# Patient Record
Sex: Male | Born: 1955 | Race: Black or African American | Hispanic: No | Marital: Married | Smoking: Never smoker
Health system: Southern US, Community
[De-identification: ages and names within clinical notes are randomized; demographics above are authoritative.]

## PROBLEM LIST (undated history)

## (undated) DIAGNOSIS — I509 Heart failure, unspecified: Secondary | ICD-10-CM

## (undated) DIAGNOSIS — J449 Chronic obstructive pulmonary disease, unspecified: Secondary | ICD-10-CM

## (undated) DIAGNOSIS — J45909 Unspecified asthma, uncomplicated: Secondary | ICD-10-CM

## (undated) DIAGNOSIS — I10 Essential (primary) hypertension: Secondary | ICD-10-CM

## (undated) DIAGNOSIS — N189 Chronic kidney disease, unspecified: Secondary | ICD-10-CM

## (undated) DIAGNOSIS — E785 Hyperlipidemia, unspecified: Secondary | ICD-10-CM

## (undated) DIAGNOSIS — G4730 Sleep apnea, unspecified: Secondary | ICD-10-CM

## (undated) DIAGNOSIS — I219 Acute myocardial infarction, unspecified: Secondary | ICD-10-CM

## (undated) HISTORY — PX: NO PAST SURGERIES: SHX2092

---

## 1997-12-13 ENCOUNTER — Emergency Department (HOSPITAL_COMMUNITY): Admission: EM | Admit: 1997-12-13 | Discharge: 1997-12-13 | Payer: Self-pay | Admitting: Emergency Medicine

## 2000-08-07 ENCOUNTER — Ambulatory Visit (HOSPITAL_BASED_OUTPATIENT_CLINIC_OR_DEPARTMENT_OTHER): Admission: RE | Admit: 2000-08-07 | Discharge: 2000-08-07 | Payer: Self-pay | Admitting: Internal Medicine

## 2001-02-20 ENCOUNTER — Ambulatory Visit (HOSPITAL_COMMUNITY): Admission: RE | Admit: 2001-02-20 | Discharge: 2001-02-20 | Payer: Self-pay | Admitting: Gastroenterology

## 2009-10-05 ENCOUNTER — Encounter (INDEPENDENT_AMBULATORY_CARE_PROVIDER_SITE_OTHER): Payer: Self-pay | Admitting: Internal Medicine

## 2009-10-05 ENCOUNTER — Inpatient Hospital Stay (HOSPITAL_COMMUNITY): Admission: EM | Admit: 2009-10-05 | Discharge: 2009-10-09 | Payer: Self-pay | Admitting: Emergency Medicine

## 2009-10-05 ENCOUNTER — Ambulatory Visit: Payer: Self-pay | Admitting: Pulmonary Disease

## 2009-11-20 ENCOUNTER — Encounter: Payer: Self-pay | Admitting: Internal Medicine

## 2010-02-13 ENCOUNTER — Observation Stay (HOSPITAL_COMMUNITY): Admission: EM | Admit: 2010-02-13 | Discharge: 2010-02-14 | Payer: Self-pay | Admitting: Emergency Medicine

## 2010-02-21 ENCOUNTER — Ambulatory Visit: Payer: Self-pay | Admitting: Family Medicine

## 2010-02-21 DIAGNOSIS — I10 Essential (primary) hypertension: Secondary | ICD-10-CM | POA: Insufficient documentation

## 2010-02-22 LAB — CONVERTED CEMR LAB
ALT: 20 units/L (ref 0–53)
Bilirubin, Direct: 0.1 mg/dL (ref 0.0–0.3)
Calcium: 9.4 mg/dL (ref 8.4–10.5)
Chloride: 105 meq/L (ref 96–112)
Cholesterol: 184 mg/dL (ref 0–200)
Creatinine, Ser: 1.1 mg/dL (ref 0.4–1.5)
Glucose, Bld: 92 mg/dL (ref 70–99)
LDL Cholesterol: 111 mg/dL — ABNORMAL HIGH (ref 0–99)
Sodium: 144 meq/L (ref 135–145)
VLDL: 28.8 mg/dL (ref 0.0–40.0)

## 2010-03-21 ENCOUNTER — Encounter: Payer: Self-pay | Admitting: Family Medicine

## 2010-03-21 DIAGNOSIS — E278 Other specified disorders of adrenal gland: Secondary | ICD-10-CM | POA: Insufficient documentation

## 2010-03-21 DIAGNOSIS — F4310 Post-traumatic stress disorder, unspecified: Secondary | ICD-10-CM

## 2010-10-02 NOTE — Letter (Signed)
Summary: Care Consideration/[employer]  Care Consideration/[employer]   Imported By: Sherian Rein 11/22/2009 13:21:15  _____________________________________________________________________  External Attachment:    Type:   Image     Comment:   External Document

## 2010-10-02 NOTE — Letter (Signed)
Summary: Records Dated 01-10-10 thru 03-06-10/[facility]  Records Dated 01-10-10 thru 03-06-10/[facility]   Imported By: Lanelle Bal 03/27/2010 13:11:44  _____________________________________________________________________  External Attachment:    Type:   Image     Comment:   External Document

## 2010-10-02 NOTE — Miscellaneous (Signed)
  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

## 2010-10-02 NOTE — Assessment & Plan Note (Signed)
Summary: NEW PT TO EST/CLE   Vital Signs:  Patient profile:   55 year old male Height:      66 inches Weight:      203.50 pounds BMI:     32.96 Temp:     98.0 degrees F oral Pulse rate:   72 / minute Pulse rhythm:   regular BP sitting:   170 / 110  (left arm) Cuff size:   regular  Vitals Entered By: Linde Gillis CMA Duncan Dull) (February 21, 2010 9:27 AM) CC: new patient   History of Present Illness: 55 yo here to establish care. Had been receiving care at the [facility].  HTN- BP very elevated today.  Reports that he has not taken his medication in two days and ate a very salty dinner last night.  Has BP cuff at home, reports it typically runs in 130s-140s/70-90s. No CP, blurred vision, SOB, LE edema. Had acute respiratory distress-vent dependent after taking ACEI.  Currently taking HCTZ 25 mg daily, Amlodipine 10 mg daily, Metoprolol 12.5 mg two times a day.  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  Supposed to have surgery to help stablize his BP.  He reports that he has changed his diet and new medicaitons have stablized his BP so he is considering canceling the surgery.  Well man- UTD on colonscopy.  Reports all other prevention is UTD as well, awaiting records.  Preventive Screening-Counseling & Management  Caffeine-Diet-Exercise     Does Patient Exercise: yes      Drug Use:  no.    Current Medications (verified): 1)  Hydrochlorothiazide 25 Mg Tabs (Hydrochlorothiazide) .... Take One Tablet By Mouth Daily 2)  Prazosin Hcl 2 Mg Caps (Prazosin Hcl) .... Take Two Tablets By Mouth At Bedtime 3)  Trazodone Hcl 50 Mg Tabs (Trazodone Hcl) .... Take One Tablet By Mouth At Bedtime 4)  Amlodipine Besylate 10 Mg Tabs (Amlodipine Besylate) .... Take One Tablet By Mouth Daily 5)  Metoprolol Succinate 25 Mg Xr24h-Tab (Metoprolol Succinate) .... Take One Half Tablet By Mouth Two Times A Day  Allergies (verified): 1)  ! Zoloft 2)  ! Ace Inhibitors  Past  History:  Family History: Last updated: 02/21/2010 Mom- HTN, DM Dad- DM  Social History: Last updated: 02/21/2010 Lives in [place] with his wife. Has four adult children. Retired Arts development officer. Alcohol use-no Drug use-no Regular exercise-yes  Risk Factors: Exercise: yes (02/21/2010)  Past Medical History: Hypertension  Past Surgical History: Denies surgical history  Family History: Reviewed history and no changes required. Mom- HTN, DM Dad- DM  Social History: Reviewed history and no changes required. Lives in [place] with his wife. Has four adult children. Retired Arts development officer. Alcohol use-no Drug use-no Regular exercise-yes Drug Use:  no Does Patient Exercise:  yes  Review of Systems      See HPI General:  Denies malaise. Eyes:  Denies blurring. ENT:  Denies difficulty swallowing. CV:  Denies chest pain or discomfort, shortness of breath with exertion, swelling of feet, and swelling of hands. Resp:  Denies shortness of breath. GI:  Denies abdominal pain, bloody stools, and change in bowel habits. GU:  Denies urinary frequency and urinary hesitancy. MS:  Denies joint pain, joint redness, joint swelling, and loss of strength. Derm:  Denies rash. Neuro:  Denies falling down, headaches, sensation of room spinning, visual disturbances, and weakness. Psych:  Denies anxiety and depression. Endo:  Denies cold intolerance and heat intolerance. Heme:  Denies abnormal bruising.  Physical Exam  General:  alert, well-developed, and overweight-appearing.   Head:  normocephalic, atraumatic, no abnormalities observed, and no abnormalities palpated.   Eyes:  vision grossly intact, pupils equal, pupils round, and pupils reactive to light.   Ears:  R ear normal and L ear normal.   Nose:  no external deformity.   Mouth:  good dentition.   Lungs:  Normal respiratory effort, chest expands symmetrically. Lungs are clear to auscultation, no crackles or wheezes. Heart:  Normal  rate and regular rhythm. S1 and S2 normal without gallop, murmur, click, rub or other extra sounds. Abdomen:  Bowel sounds positive,abdomen soft and non-tender without masses, organomegaly or hernias noted. Msk:  No deformity or scoliosis noted of thoracic or lumbar spine.   Extremities:  no edema Neurologic:  alert & oriented X3, cranial nerves II-XII intact, and gait normal.   Skin:  Intact without suspicious lesions or rashes Psych:  Cognition and judgment appear intact. Alert and cooperative with normal attention span and concentration. No apparent delusions, illusions, hallucinations   Impression & Recommendations:  Problem # 1:  HYPERTENSION (ICD-401.9) Assessment Deteriorated Time spent with patient 45 minutes, more than 50% of this time was spent discussing patient's high blood pressure, past and recent treatments.  Plans for ?adrenal surgery. Per pt, has not taken his medication today. Advised taking it when he gets home and calling me with BP log over next several days. Signed medical release form in office today to get records from the [place].  Check BMET today to make sure his electrolytes and kidney function are stable considering his erratic BP and high dose medications. His updated medication list for this problem includes:    Hydrochlorothiazide 25 Mg Tabs (Hydrochlorothiazide) .Marland Kitchen... Take one tablet by mouth daily    Prazosin Hcl 2 Mg Caps (Prazosin hcl) .Marland Kitchen... Take two tablets by mouth at bedtime    Amlodipine Besylate 10 Mg Tabs (Amlodipine besylate) .Marland Kitchen... Take one tablet by mouth daily    Metoprolol Succinate 25 Mg Xr24h-tab (Metoprolol succinate) .Marland Kitchen... Take one half tablet by mouth two times a day  Orders: Venipuncture (16109) TLB-BMP (Basic Metabolic Panel-BMET) (80048-METABOL)  Problem # 2:  Preventive Health Care (ICD-V70.0) Assessment: Comment Only Per pt, UTD on prevention.  Awaiting records. FLP, hepatic panel today.  Complete Medication List: 1)   Hydrochlorothiazide 25 Mg Tabs (Hydrochlorothiazide) .... Take one tablet by mouth daily 2)  Prazosin Hcl 2 Mg Caps (Prazosin hcl) .... Take two tablets by mouth at bedtime 3)  Trazodone Hcl 50 Mg Tabs (Trazodone hcl) .... Take one tablet by mouth at bedtime 4)  Amlodipine Besylate 10 Mg Tabs (Amlodipine besylate) .... Take one tablet by mouth daily 5)  Metoprolol Succinate 25 Mg Xr24h-tab (Metoprolol succinate) .... Take one half tablet by mouth two times a day  Other Orders: TLB-Lipid Panel (80061-LIPID) TLB-Hepatic/Liver Function Pnl (80076-HEPATIC)  Current Allergies (reviewed today): ! ZOLOFT ! ACE INHIBITORS  Flex Sig Next Due:  Not Indicated Colonoscopy Result Date:  02/13/2006 Colonoscopy Result:  polyps- historical Colonoscopy Next Due:  5 yr Hemoccult Next Due:  Not Indicated

## 2010-11-13 IMAGING — CT CT HEAD W/O CM
1 series · 16 of 30 positions shown, 20 images · non-contrast
Comparison: Neck CT 10/05/2009

CLINICAL DATA: Syncope, altered level of consciousness

CT HEAD WITHOUT CONTRAST
TECHNIQUE: Contiguous axial images were obtained from the base of
the skull through the vertex without contrast.

[Series 2: brain · axial · 0.47mm/px · z∈[+133,+273]mm · 16 of 30 slices shown, 20 images]
[im 2/30  brain]
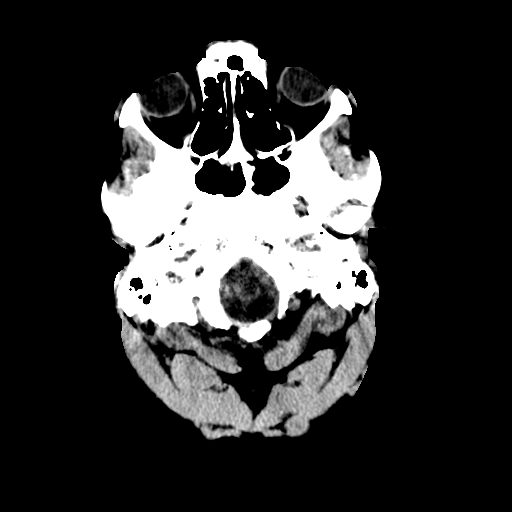
[im 2/30  bone]
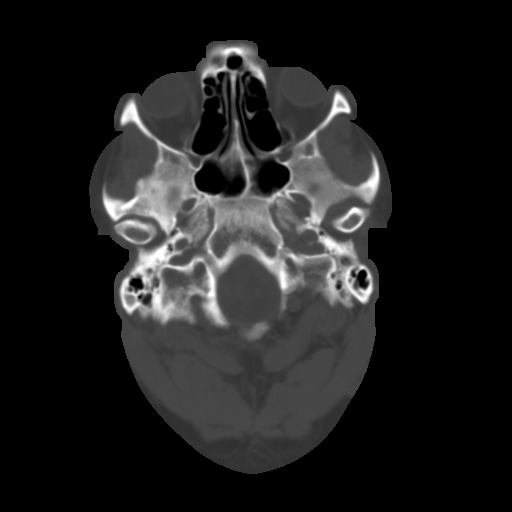
[im 4/30  brain]
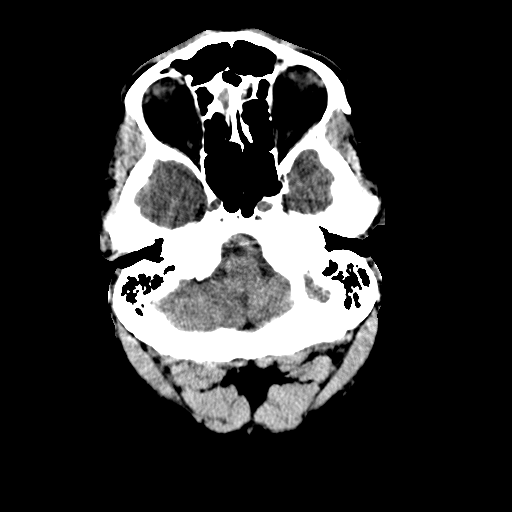
[im 6/30  brain]
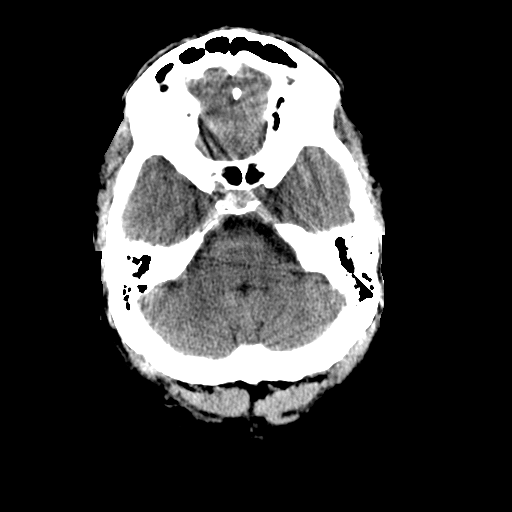
[im 8/30  brain]
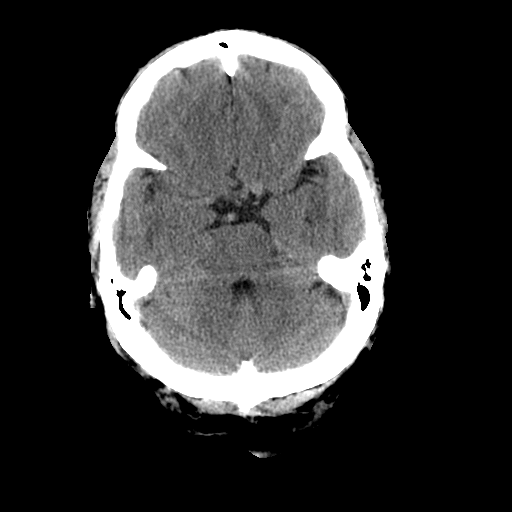
[im 9/30  brain]
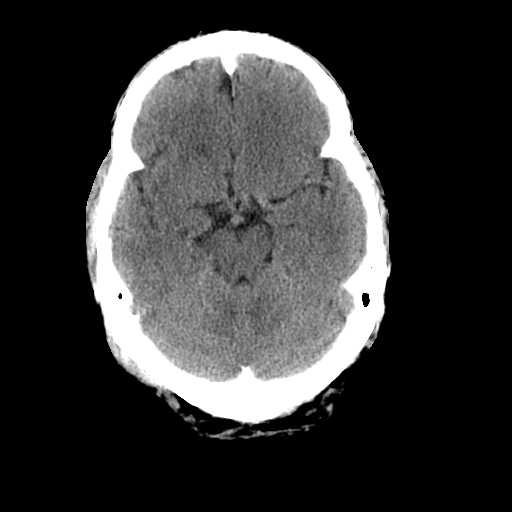
[im 9/30  bone]
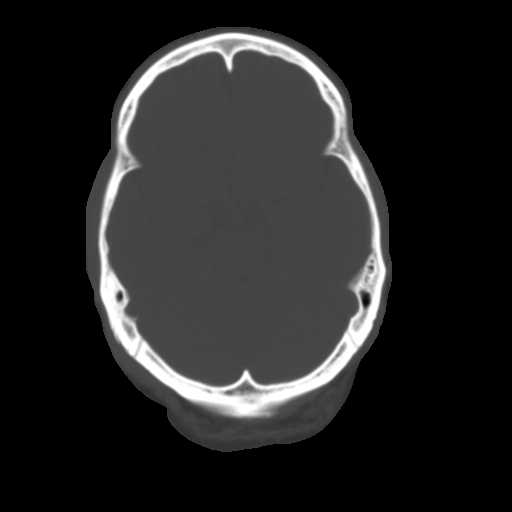
[im 11/30  brain]
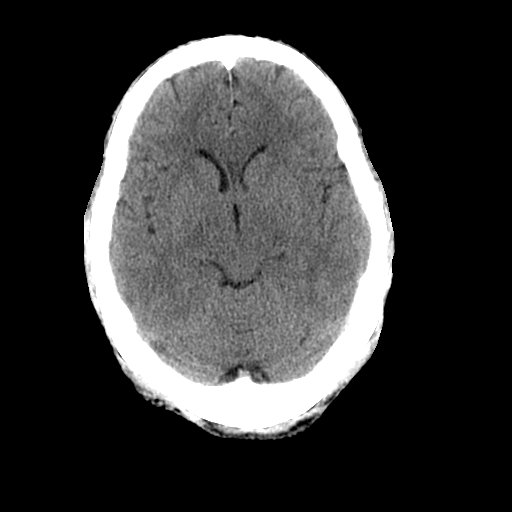
[im 13/30  brain]
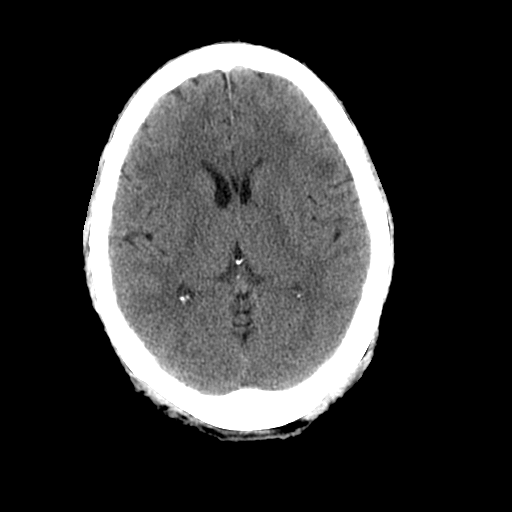
[im 15/30  brain]
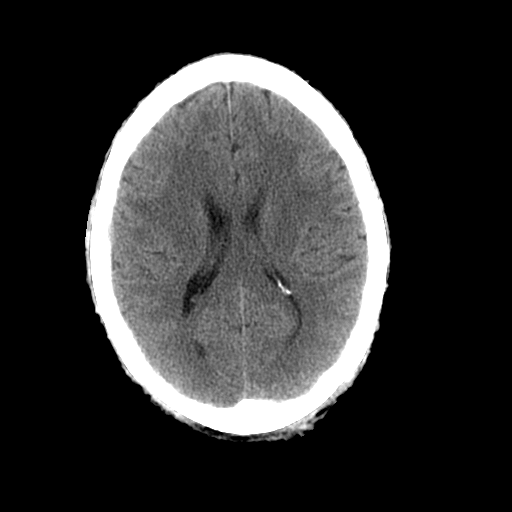
[im 16/30  brain]
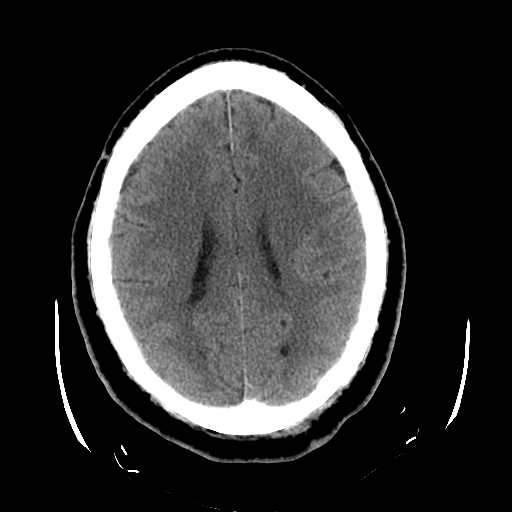
[im 16/30  bone]
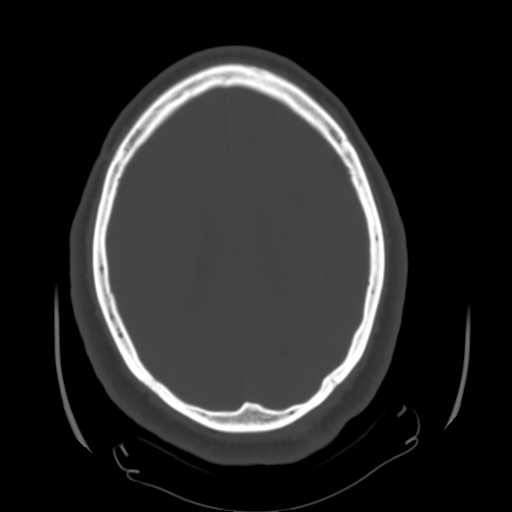
[im 18/30  brain]
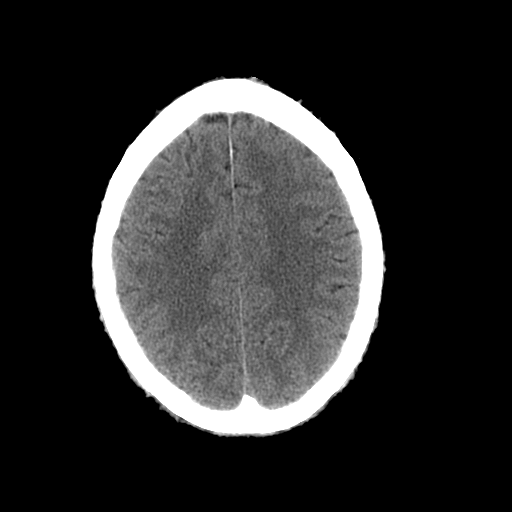
[im 20/30  brain]
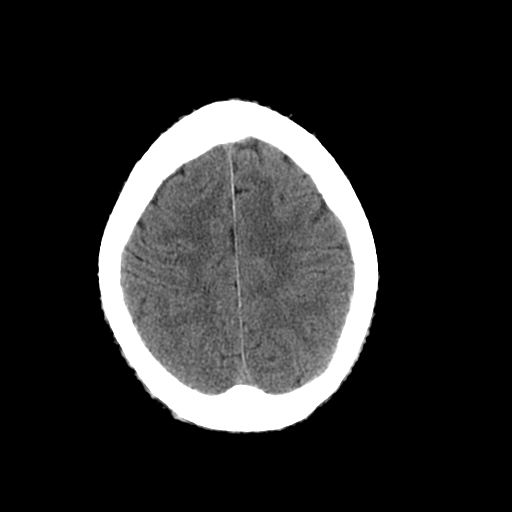
[im 22/30  brain]
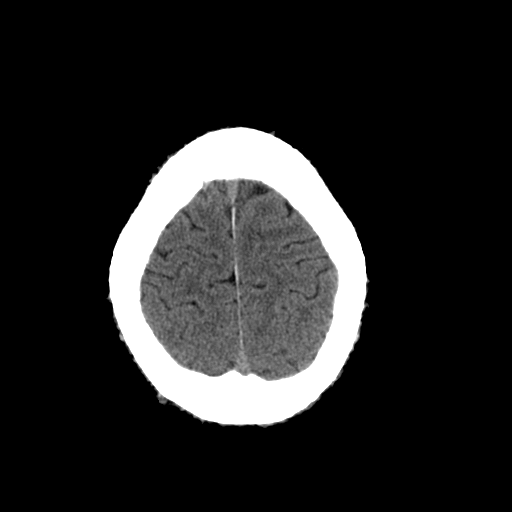
[im 23/30  brain]
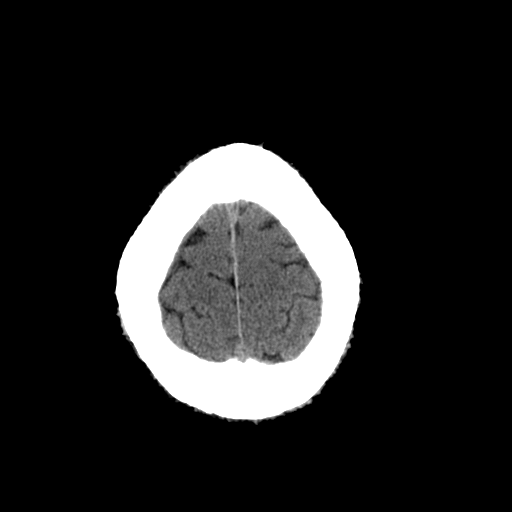
[im 23/30  bone]
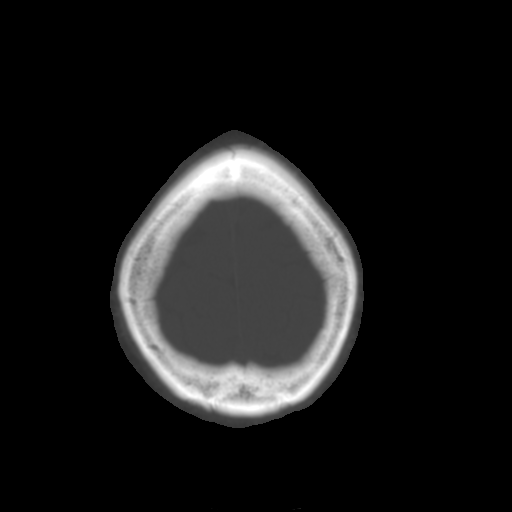
[im 25/30  brain]
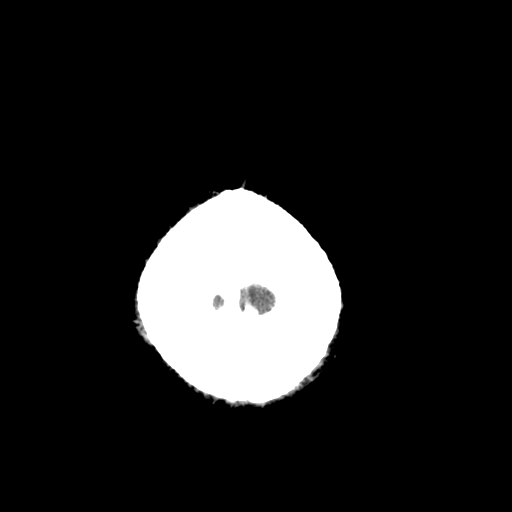
[im 27/30  brain]
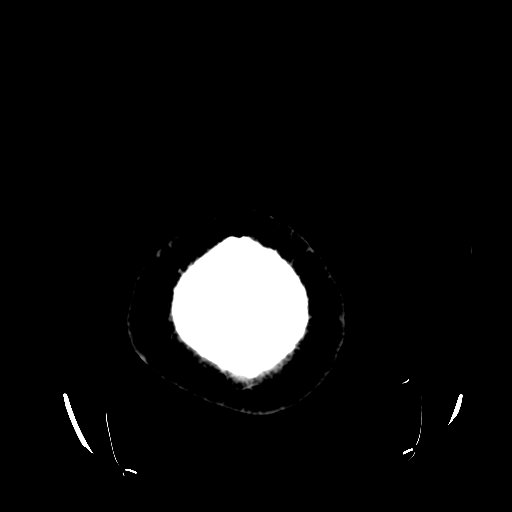
[im 29/30  brain]
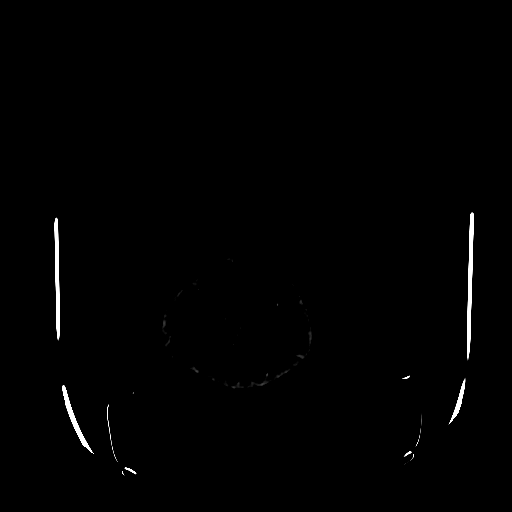

[16 of 30 positions shown; findings below may reference images not displayed]

FINDINGS: No acute intracranial hemorrhage.  No focal mass lesion.
No CT evidence of acute infarction.  No midline shift or mass
effect.  No hydrocephalus.

Paranasal sinuses and mastoid air cells are clear.  Orbits are
normal.
IMPRESSION: Normal CT of the  head.

## 2010-11-19 LAB — URINALYSIS, ROUTINE W REFLEX MICROSCOPIC
Ketones, ur: 15 mg/dL — AB
Leukocytes, UA: NEGATIVE
Protein, ur: 30 mg/dL — AB
Specific Gravity, Urine: 1.025 (ref 1.005–1.030)
Urobilinogen, UA: 0.2 mg/dL (ref 0.0–1.0)
pH: 5.5 (ref 5.0–8.0)

## 2010-11-19 LAB — COMPREHENSIVE METABOLIC PANEL
ALT: 24 U/L (ref 0–53)
AST: 26 U/L (ref 0–37)
Albumin: 3.6 g/dL (ref 3.5–5.2)
Alkaline Phosphatase: 55 U/L (ref 39–117)
CO2: 27 mEq/L (ref 19–32)
Calcium: 9.1 mg/dL (ref 8.4–10.5)
Chloride: 106 mEq/L (ref 96–112)
GFR calc non Af Amer: 53 mL/min — ABNORMAL LOW (ref >60)
Glucose, Bld: 139 mg/dL — ABNORMAL HIGH (ref 70–99)
Total Bilirubin: 0.7 mg/dL (ref 0.3–1.2)
Total Protein: 6.5 g/dL (ref 6.0–8.3)

## 2010-11-19 LAB — RAPID URINE DRUG SCREEN, HOSP PERFORMED
Amphetamines: NOT DETECTED
Benzodiazepines: POSITIVE — AB
Cocaine: NOT DETECTED
Opiates: NOT DETECTED
Tetrahydrocannabinol: NOT DETECTED

## 2010-11-19 LAB — CBC
HCT: 39.3 % (ref 39.0–52.0)
Hemoglobin: 13 g/dL (ref 13.0–17.0)
MCHC: 33 g/dL (ref 30.0–36.0)
MCV: 87.3 fL (ref 78.0–100.0)
RBC: 4.5 MIL/uL (ref 4.22–5.81)
WBC: 8.5 10*3/uL (ref 4.0–10.5)

## 2010-11-19 LAB — SALICYLATE LEVEL: Salicylate Lvl: <4 mg/dL (ref 2.8–20.0)

## 2010-11-19 LAB — URINE MICROSCOPIC-ADD ON

## 2010-11-19 LAB — DIFFERENTIAL
Eosinophils Absolute: 0.1 10*3/uL (ref 0.0–0.7)
Neutrophils Relative %: 80 % — ABNORMAL HIGH (ref 43–77)

## 2010-11-23 LAB — CBC
HCT: 39.7 % (ref 39.0–52.0)
HCT: 41.2 % (ref 39.0–52.0)
Hemoglobin: 13.3 g/dL (ref 13.0–17.0)
Hemoglobin: 13.7 g/dL (ref 13.0–17.0)
Hemoglobin: 13.7 g/dL (ref 13.0–17.0)
MCHC: 33.2 g/dL (ref 30.0–36.0)
MCV: 89.2 fL (ref 78.0–100.0)
Platelets: 254 10*3/uL (ref 150–400)
Platelets: 263 10*3/uL (ref 150–400)
RDW: 13.5 % (ref 11.5–15.5)
RDW: 14.1 % (ref 11.5–15.5)
WBC: 17.5 10*3/uL — ABNORMAL HIGH (ref 4.0–10.5)
WBC: 6.3 10*3/uL (ref 4.0–10.5)

## 2010-11-23 LAB — BASIC METABOLIC PANEL
BUN: 11 mg/dL (ref 6–23)
BUN: 11 mg/dL (ref 6–23)
BUN: 14 mg/dL (ref 6–23)
CO2: 27 mEq/L (ref 19–32)
Calcium: 9.1 mg/dL (ref 8.4–10.5)
Chloride: 99 mEq/L (ref 96–112)
GFR calc Af Amer: >60 mL/min (ref >60)
GFR calc non Af Amer: 55 mL/min — ABNORMAL LOW (ref >60)
Glucose, Bld: 102 mg/dL — ABNORMAL HIGH (ref 70–99)
Glucose, Bld: 114 mg/dL — ABNORMAL HIGH (ref 70–99)
Glucose, Bld: 131 mg/dL — ABNORMAL HIGH (ref 70–99)
Potassium: 2.5 mEq/L — CL (ref 3.5–5.1)
Potassium: 3.2 mEq/L — ABNORMAL LOW (ref 3.5–5.1)
Sodium: 139 mEq/L (ref 135–145)

## 2010-11-23 LAB — LEGIONELLA ANTIGEN, URINE: Legionella Antigen, Urine: NEGATIVE

## 2010-11-23 LAB — DIFFERENTIAL
Basophils Absolute: 0 10*3/uL (ref 0.0–0.1)
Basophils Relative: 0 % (ref 0–1)
Eosinophils Absolute: 0.3 10*3/uL (ref 0.0–0.7)
Eosinophils Relative: 2 % (ref 0–5)
Eosinophils Relative: 5 % (ref 0–5)
Lymphs Abs: 4.4 10*3/uL — ABNORMAL HIGH (ref 0.7–4.0)
Monocytes Absolute: 0.5 10*3/uL (ref 0.1–1.0)
Monocytes Absolute: 1.1 10*3/uL — ABNORMAL HIGH (ref 0.1–1.0)
Monocytes Relative: 7 % (ref 3–12)
Monocytes Relative: 8 % (ref 3–12)
Neutro Abs: 3.7 10*3/uL (ref 1.7–7.7)
Neutrophils Relative %: 59 % (ref 43–77)

## 2010-11-23 LAB — GLUCOSE, CAPILLARY
Glucose-Capillary: 102 mg/dL — ABNORMAL HIGH (ref 70–99)
Glucose-Capillary: 133 mg/dL — ABNORMAL HIGH (ref 70–99)
Glucose-Capillary: 150 mg/dL — ABNORMAL HIGH (ref 70–99)
Glucose-Capillary: 164 mg/dL — ABNORMAL HIGH (ref 70–99)
Glucose-Capillary: 187 mg/dL — ABNORMAL HIGH (ref 70–99)
Glucose-Capillary: 95 mg/dL (ref 70–99)
Glucose-Capillary: 98 mg/dL (ref 70–99)

## 2010-11-23 LAB — POCT I-STAT 3, ART BLOOD GAS (G3+)
Bicarbonate: 28 mEq/L — ABNORMAL HIGH (ref 20.0–24.0)
O2 Saturation: 100 %
TCO2: 29 mmol/L (ref 0–100)
pCO2 arterial: 36.2 mmHg (ref 35.0–45.0)
pO2, Arterial: 266 mmHg — ABNORMAL HIGH (ref 80.0–100.0)

## 2010-11-23 LAB — URINALYSIS, ROUTINE W REFLEX MICROSCOPIC
Bilirubin Urine: NEGATIVE
Nitrite: NEGATIVE
Specific Gravity, Urine: 1.005 (ref 1.005–1.030)
pH: 5.5 (ref 5.0–8.0)

## 2010-11-23 LAB — LACTIC ACID, PLASMA: Lactic Acid, Venous: 2.9 mmol/L — ABNORMAL HIGH (ref 0.5–2.2)

## 2010-11-23 LAB — URINE CULTURE

## 2010-11-23 LAB — SEDIMENTATION RATE: Sed Rate: 12 mm/hr (ref 0–16)

## 2010-11-23 LAB — CARDIAC PANEL(CRET KIN+CKTOT+MB+TROPI)
Relative Index: 0.8 (ref 0.0–2.5)
Total CK: 276 U/L — ABNORMAL HIGH (ref 7–232)
Troponin I: 0.02 ng/mL (ref 0.00–0.06)

## 2010-11-23 LAB — APTT: aPTT: 29 seconds (ref 24–37)

## 2010-11-23 LAB — CULTURE, BLOOD (ROUTINE X 2): Culture: NO GROWTH

## 2010-11-23 LAB — PROTIME-INR
INR: 1.11 (ref 0.00–1.49)
Prothrombin Time: 14.2 seconds (ref 11.6–15.2)

## 2010-11-23 LAB — PHOSPHORUS: Phosphorus: <1 mg/dL — CL (ref 2.3–4.6)

## 2010-11-23 LAB — POCT CARDIAC MARKERS: Myoglobin, poc: >500 ng/mL (ref 12–200)

## 2010-11-23 LAB — CK TOTAL AND CKMB (NOT AT ARMC): CK, MB: 1.9 ng/mL (ref 0.3–4.0)

## 2010-11-23 LAB — TROPONIN I: Troponin I: 0.02 ng/mL (ref 0.00–0.06)

## 2010-11-23 LAB — MRSA PCR SCREENING: MRSA by PCR: NEGATIVE

## 2011-01-18 NOTE — Procedures (Signed)
[place]. [facility]  Patient:    Kevin Heath, Kevin Heath                     MRN: [id number] Proc. Date: 02/20/01 Adm. Date:  [id number] Attending:  Rich Brave CC:         Earl Lites, M.D.   Procedure Report  PROCEDURE:  Colonoscopy.  INDICATION:  Rectal bleeding and rectal pain with change in bowel habits in a 55 year old male.  FINDINGS:  Normal exam to the cecum.  PROCEDURE:  The nature, purpose and risks of the procedure have been discussed with the patient and provided written consent.  Sedation for this procedure and the upper endoscopy which preceded it totalled fentanyl 50 mcg and versed 5 mg IV without arrhythmias or desaturation.  Digital exam of the prostate was normal.  The Olympus Adult Video colonoscope was advanced with some difficulty due to looping to the cecum.  We really reached the level of the ileocecal valve but I was able to look down into the cecum and see essentially the entire cecal surface area.  Pullback was then performed.  There was a small to moderate amount of scattered formed stool here and there and a little bit of puddling of liquid stool but it is not felt that any significant lesions would have been obscured by this.  This was a normal examination.  No polyps, cancer, colitis, vascular malformations or diverticulosis were observed.  In particular, there was no evidence of ulcerative proctitis.  The patient did have rather impressive, somewhat excoriated, internal hemorrhoids and increased anal sphincter tone.  The patient tolerated the procedure well and there were no apparent complications.  No biopsies were obtained.  IMPRESSION:  Moderate internal hemorrhoids, which are the presumed source of bleeding.  Perhaps a anal fissure could be present.  No evidence of proctitis, polyps or cancer.  PLAN:  Local care for hemorrhoidal inflammation. DD:  02/20/01 TD:  02/21/01 Job: [id number]

## 2011-01-18 NOTE — Procedures (Signed)
[place]. [facility]  Patient:    Kevin Heath, Kevin Heath                     MRN: [id number] Proc. Date: 02/20/01 Adm. Date:  [id number] Attending:  Rich Brave CC:         Earl Lites, M.D.   Procedure Report  PROCEDURE:  Upper endoscopy.  INDICATIONS:  Reflux symptoms in a 55 year old.  FINDINGS:  Medium sized hiatal hernia with esophageal ring but no reflux esophagitis.  PROCEDURE:  The nature, purpose, risks of the procedure had been discussed with the patient who provided written consent.  MEDICATIONS:  Sedation for this procedure and the colonoscopy which followed it totalled Fentanyl 50 mcg and Versed 5 mg IV without arrhythmias or desaturation.  The Olympus small caliber GIF 160 adult video endoscope was passed under direct vision.  The vocal cords were not well seen.  The esophagus was readily entered and had normal mucosa without evidence of reflux esophagitis, Barretts esophagus, varices, infection or neoplasia.  The patient was retching a fair amount during much of the procedure.  There was an esophageal ring present but it was widely patent.  There appeared to be a small, probably 2-3 cm hiatal hernia.  The stomach was entered.  It contained no significant residual and had normal mucosa without evidence of gastritis, erosions, ulcers, polyps or masses and retroflexed viewing of the proximal stomach was unrevealing, in that the patient was retching quite a bit but no lesions in the proximal stomach were able to be observed.  The pylorus, duodenal bulb and second duodenum looked normal.  The scope was removed from the patient, no biopsies were obtained.  He tolerated the procedure well and there were no apparent complications.  IMPRESSION:  A small to medium sized hiatal hernia with esophageal ring but no evidence of reflux esophagitis.  PLAN:  Symptomatic therapy of reflux, currently proton pump inhibitors.  Continue proton pump  inhibitor therapy as needed. DD:  02/20/01 TD:  02/21/01 Job: [id number]

## 2023-01-11 ENCOUNTER — Emergency Department (HOSPITAL_COMMUNITY)
Admission: EM | Admit: 2023-01-11 | Discharge: 2023-01-11 | Disposition: A | Discharge status: Home / Self Care | Payer: No Typology Code available for payment source | Attending: Emergency Medicine | Admitting: Emergency Medicine

## 2023-01-11 ENCOUNTER — Emergency Department (HOSPITAL_COMMUNITY): Payer: No Typology Code available for payment source

## 2023-01-11 DIAGNOSIS — R079 Chest pain, unspecified: Secondary | ICD-10-CM | POA: Insufficient documentation

## 2023-01-11 DIAGNOSIS — I509 Heart failure, unspecified: Secondary | ICD-10-CM | POA: Diagnosis not present

## 2023-01-11 DIAGNOSIS — R0602 Shortness of breath: Secondary | ICD-10-CM | POA: Diagnosis present

## 2023-01-11 LAB — CBC
HCT: 45.8 % (ref 39.0–52.0)
Hemoglobin: 13.9 g/dL (ref 13.0–17.0)
MCH: 28.1 pg (ref 26.0–34.0)
MCHC: 30.3 g/dL (ref 30.0–36.0)
MCV: 92.7 fL (ref 80.0–100.0)
Platelets: 230 10*3/uL (ref 150–400)
RBC: 4.94 MIL/uL (ref 4.22–5.81)
RDW: 13.2 % (ref 11.5–15.5)
WBC: 8.8 10*3/uL (ref 4.0–10.5)
nRBC: 0 % (ref 0.0–0.2)

## 2023-01-11 LAB — TROPONIN I (HIGH SENSITIVITY)
Troponin I (High Sensitivity): 28 ng/L — ABNORMAL HIGH (ref <18)
Troponin I (High Sensitivity): 25 ng/L — ABNORMAL HIGH (ref <18)

## 2023-01-11 LAB — BASIC METABOLIC PANEL
Anion gap: 9 (ref 5–15)
BUN: 14 mg/dL (ref 8–23)
CO2: 27 mmol/L (ref 22–32)
Calcium: 9.7 mg/dL (ref 8.9–10.3)
Chloride: 104 mmol/L (ref 98–111)
Creatinine, Ser: 1.43 mg/dL — ABNORMAL HIGH (ref 0.61–1.24)
GFR, Estimated: 54 mL/min — ABNORMAL LOW (ref >60)
Glucose, Bld: 134 mg/dL — ABNORMAL HIGH (ref 70–99)
Potassium: 3.6 mmol/L (ref 3.5–5.1)
Sodium: 140 mmol/L (ref 135–145)

## 2023-01-11 LAB — MAGNESIUM: Magnesium: 1.9 mg/dL (ref 1.7–2.4)

## 2023-01-11 LAB — BRAIN NATRIURETIC PEPTIDE: B Natriuretic Peptide: 237.1 pg/mL — ABNORMAL HIGH (ref 0.0–100.0)

## 2023-01-11 MED ORDER — FUROSEMIDE 10 MG/ML IJ SOLN
40.0000 mg | Freq: Once | INTRAMUSCULAR | Status: AC
  Administered 2023-01-11: 40 mg via INTRAVENOUS
  Filled 2023-01-11: qty 4

## 2023-01-11 MED ORDER — POTASSIUM CHLORIDE CRYS ER 20 MEQ PO TBCR
20.0000 meq | EXTENDED_RELEASE_TABLET | Freq: Once | ORAL | Status: AC
  Administered 2023-01-11: 20 meq via ORAL
  Filled 2023-01-11: qty 1

## 2023-01-11 NOTE — ED Notes (Signed)
Patient ambulated around the nursing station, O2 sats stayed around 97%.

## 2023-01-11 NOTE — ED Provider Notes (Signed)
[place] EMERGENCY DEPARTMENT AT [facility] Provider Note   CSN: [id number] Arrival date & time: 01/11/23  1532     History  Chief Complaint  Patient presents with   Shortness of Breath   HPI Kevin Heath is a 67 y.o. male with history of CHF presenting with shortness of breath.  This started gradually 3 days ago and is gotten progressively worse.  Also endorsing associated chest pressure in the left side of his chest that is nonradiating.  Shortness of breath is worse with lying down and with exertion.  Denies cough congestion fever.  States he is compliant with taking his Lasix and HCTZ.  States he takes 40 mg p.o. daily of Lasix. Also thinks he is accumulating some fluid around his abdomen.  Also states he may have gained 5 or 6 pounds in the last 3 days.  Patient not having chest pain at this time.  States he did take his diuretics today.  Followed by the [facility] for his CHF.   Shortness of Breath      Home Medications Prior to Admission medications   Not on File      Allergies    Ace inhibitors and Sertraline hcl    Review of Systems   Review of Systems  Respiratory:  Positive for shortness of breath.     Physical Exam   Vitals:   01/11/23 1915 01/11/23 2000  BP: (!) 151/97 (!) 118/96  Pulse: 76 71  Resp: (!) 25 20  Temp:    SpO2: 95% 96%    CONSTITUTIONAL:  well-appearing, NAD NEURO:  Alert and oriented x 3, CN 3-12 grossly intact EYES:  eyes equal and reactive ENT/NECK:  Supple, no stridor  CARDIO:  regular rate and rhythm, appears well-perfused  PULM:  No respiratory distress, tachypneic and intermittently hypoxic while talking into the mid 80s, on room air GI/GU:  non-distended, soft MSK/SPINE:  No gross deformities, no edema, moves all extremities  SKIN:  no rash, atraumatic  *Additional and/or pertinent findings included in MDM below  ED Results / Procedures / Treatments   Labs (all labs ordered are listed, but only abnormal results  are displayed) Labs Reviewed  BASIC METABOLIC PANEL - Abnormal; Notable for the following components:      Result Value   Glucose, Bld 134 (*)    Creatinine, Ser 1.43 (*)    GFR, Estimated 54 (*)    All other components within normal limits  BRAIN NATRIURETIC PEPTIDE - Abnormal; Notable for the following components:   B Natriuretic Peptide 237.1 (*)    All other components within normal limits  TROPONIN I (HIGH SENSITIVITY) - Abnormal; Notable for the following components:   Troponin I (High Sensitivity) 28 (*)    All other components within normal limits  TROPONIN I (HIGH SENSITIVITY) - Abnormal; Notable for the following components:   Troponin I (High Sensitivity) 25 (*)    All other components within normal limits  MAGNESIUM  CBC    EKG None  Radiology DG Chest Portable 1 View  Result Date: 01/11/2023 CLINICAL DATA:  Shortness of breath EXAM: PORTABLE CHEST 1 VIEW COMPARISON:  Chest x-ray October 06, 2009 FINDINGS: No pneumothorax. Stable cardiomegaly. The hila and mediastinum are normal. No pulmonary nodules or masses. No focal infiltrates identified. No overt edema. IMPRESSION: No active disease. Electronically Signed   By: Gerome Sam III M.D.   On: 01/11/2023 17:02    Procedures Procedures    Medications Ordered in  ED Medications  furosemide (LASIX) injection 40 mg (40 mg Intravenous Given 01/11/23 1651)  potassium chloride SA (KLOR-CON M) CR tablet 20 mEq (20 mEq Oral Given 01/11/23 1651)    ED Course/ Medical Decision Making/ A&P                             Medical Decision Making Amount and/or Complexity of Data Reviewed Labs: ordered. Radiology: ordered.  Risk Prescription drug management.   Initial Impression and Ddx 67 year old well-appearing male presenting for shortness of breath and chest tightness.  Exam is notable for tachypnea and intermittent hypoxia.  DDx includes CHF exacerbation, ACS, PE, pneumonia, pneumothorax, aortic  dissection. Patient PMH that increases complexity of ED encounter:  CHF  Interpretation of Diagnostics I independent reviewed and interpreted the labs as followed: Elevated BNP, elevated troponin, elevated creatinine  - I independently visualized the following imaging with scope of interpretation limited to determining acute life threatening conditions related to emergency care: CXR, which revealed no acute cardiopulmonary processes  I personally reviewed and interpreted EKG which revealed normal sinus rhythm  Patient Reassessment and Ultimate Disposition/Management Primary concern was CHF exacerbation. Treated with IV Lasix.  Able to avoid approximately 3.5 L of urine.  Afterwards, walked patient around the unit and patient stated that he was short of breath and maintain sats in the high 90s.  Advised him to double his dose of Lasix at home for the next 3 days and to follow-up with his cardiologist at the [place].  Patient also assured me that he has potassium supplements at home.  Did replete his potassium here with oral potassium.  Also considered ACS but unlikely given no troponin delta, EKG with normal sinus rhythm.  Patient management required discussion with the following services or consulting groups:  None  Complexity of Problems Addressed Acute complicated illness or Injury  Additional Data Reviewed and Analyzed Further history obtained from: Past medical history and medications listed in the EMR and Prior ED visit notes  Patient Encounter Risk Assessment Consideration of hospitalization         Final Clinical Impression(s) / ED Diagnoses Final diagnoses:  Acute on chronic congestive heart failure, unspecified heart failure type [facility])    Rx / DC Orders ED Discharge Orders     None         Gareth Eagle, PA-C 01/11/23 2143    Gerhard Munch, MD 01/12/23 2359

## 2023-01-11 NOTE — ED Triage Notes (Signed)
Pt arrives via [facility] from home for worsening SOB over the last three days. Pt has hx of CHF. States he usually gets swelling in his abdomen during exacerbation, and has noticed it swell during that time. Pt reports gaining 5-6 pounds during the last three days. Pt denies CP currently. VSS with EMS.

## 2023-01-11 NOTE — Discharge Instructions (Addendum)
Evaluation of your shortness of breath revealed that you likely had CHF exacerbation.  You responded well to the IV Lasix.  Advised that you double your dose of Lasix for the next 3 days and follow-up with your cardiologist.  Lasix dose will be 80 mg daily for the next 3 days. If you have worsening shortness of breath, chest pain, calf tenderness or any other concern please return emergency department for further evaluation.

## 2023-07-26 ENCOUNTER — Inpatient Hospital Stay (HOSPITAL_COMMUNITY): Payer: No Typology Code available for payment source

## 2023-07-26 ENCOUNTER — Inpatient Hospital Stay (HOSPITAL_COMMUNITY)
Admission: EM | Admit: 2023-07-26 | Discharge: 2023-08-02 | DRG: 286 | Disposition: A | Discharge status: Home / Self Care | Payer: No Typology Code available for payment source | Attending: Internal Medicine | Admitting: Internal Medicine

## 2023-07-26 ENCOUNTER — Encounter (HOSPITAL_COMMUNITY): Payer: Self-pay

## 2023-07-26 ENCOUNTER — Emergency Department (HOSPITAL_COMMUNITY): Payer: No Typology Code available for payment source

## 2023-07-26 DIAGNOSIS — I77819 Aortic ectasia, unspecified site: Secondary | ICD-10-CM | POA: Diagnosis present

## 2023-07-26 DIAGNOSIS — I480 Paroxysmal atrial fibrillation: Secondary | ICD-10-CM | POA: Diagnosis present

## 2023-07-26 DIAGNOSIS — I493 Ventricular premature depolarization: Secondary | ICD-10-CM | POA: Diagnosis not present

## 2023-07-26 DIAGNOSIS — Z6835 Body mass index (BMI) 35.0-35.9, adult: Secondary | ICD-10-CM | POA: Diagnosis not present

## 2023-07-26 DIAGNOSIS — I4891 Unspecified atrial fibrillation: Secondary | ICD-10-CM | POA: Diagnosis not present

## 2023-07-26 DIAGNOSIS — I252 Old myocardial infarction: Secondary | ICD-10-CM

## 2023-07-26 DIAGNOSIS — I5033 Acute on chronic diastolic (congestive) heart failure: Secondary | ICD-10-CM | POA: Diagnosis present

## 2023-07-26 DIAGNOSIS — I509 Heart failure, unspecified: Secondary | ICD-10-CM

## 2023-07-26 DIAGNOSIS — I25110 Atherosclerotic heart disease of native coronary artery with unstable angina pectoris: Secondary | ICD-10-CM | POA: Diagnosis present

## 2023-07-26 DIAGNOSIS — J9811 Atelectasis: Secondary | ICD-10-CM | POA: Diagnosis present

## 2023-07-26 DIAGNOSIS — N179 Acute kidney failure, unspecified: Secondary | ICD-10-CM | POA: Diagnosis present

## 2023-07-26 DIAGNOSIS — Z7982 Long term (current) use of aspirin: Secondary | ICD-10-CM

## 2023-07-26 DIAGNOSIS — I2510 Atherosclerotic heart disease of native coronary artery without angina pectoris: Secondary | ICD-10-CM | POA: Diagnosis present

## 2023-07-26 DIAGNOSIS — G4733 Obstructive sleep apnea (adult) (pediatric): Secondary | ICD-10-CM | POA: Diagnosis present

## 2023-07-26 DIAGNOSIS — I5021 Acute systolic (congestive) heart failure: Secondary | ICD-10-CM | POA: Diagnosis not present

## 2023-07-26 DIAGNOSIS — N189 Chronic kidney disease, unspecified: Secondary | ICD-10-CM | POA: Diagnosis present

## 2023-07-26 DIAGNOSIS — J449 Chronic obstructive pulmonary disease, unspecified: Secondary | ICD-10-CM | POA: Diagnosis present

## 2023-07-26 DIAGNOSIS — I472 Ventricular tachycardia, unspecified: Secondary | ICD-10-CM | POA: Diagnosis not present

## 2023-07-26 DIAGNOSIS — J441 Chronic obstructive pulmonary disease with (acute) exacerbation: Secondary | ICD-10-CM | POA: Diagnosis present

## 2023-07-26 DIAGNOSIS — I200 Unstable angina: Secondary | ICD-10-CM | POA: Diagnosis not present

## 2023-07-26 DIAGNOSIS — I272 Pulmonary hypertension, unspecified: Secondary | ICD-10-CM | POA: Diagnosis present

## 2023-07-26 DIAGNOSIS — R7303 Prediabetes: Secondary | ICD-10-CM

## 2023-07-26 DIAGNOSIS — E66812 Obesity, class 2: Secondary | ICD-10-CM | POA: Diagnosis not present

## 2023-07-26 DIAGNOSIS — I5043 Acute on chronic combined systolic (congestive) and diastolic (congestive) heart failure: Secondary | ICD-10-CM | POA: Diagnosis not present

## 2023-07-26 DIAGNOSIS — E785 Hyperlipidemia, unspecified: Secondary | ICD-10-CM | POA: Diagnosis present

## 2023-07-26 DIAGNOSIS — E876 Hypokalemia: Secondary | ICD-10-CM | POA: Diagnosis present

## 2023-07-26 DIAGNOSIS — N1832 Chronic kidney disease, stage 3b: Secondary | ICD-10-CM | POA: Diagnosis present

## 2023-07-26 DIAGNOSIS — Z888 Allergy status to other drugs, medicaments and biological substances status: Secondary | ICD-10-CM

## 2023-07-26 DIAGNOSIS — R001 Bradycardia, unspecified: Secondary | ICD-10-CM | POA: Diagnosis not present

## 2023-07-26 DIAGNOSIS — I420 Dilated cardiomyopathy: Secondary | ICD-10-CM | POA: Diagnosis present

## 2023-07-26 DIAGNOSIS — Z79899 Other long term (current) drug therapy: Secondary | ICD-10-CM | POA: Diagnosis not present

## 2023-07-26 DIAGNOSIS — I2583 Coronary atherosclerosis due to lipid rich plaque: Secondary | ICD-10-CM | POA: Diagnosis not present

## 2023-07-26 DIAGNOSIS — Z1152 Encounter for screening for COVID-19: Secondary | ICD-10-CM | POA: Diagnosis not present

## 2023-07-26 DIAGNOSIS — I130 Hypertensive heart and chronic kidney disease with heart failure and stage 1 through stage 4 chronic kidney disease, or unspecified chronic kidney disease: Principal | ICD-10-CM | POA: Diagnosis present

## 2023-07-26 DIAGNOSIS — I428 Other cardiomyopathies: Secondary | ICD-10-CM | POA: Diagnosis not present

## 2023-07-26 DIAGNOSIS — Z7901 Long term (current) use of anticoagulants: Secondary | ICD-10-CM

## 2023-07-26 DIAGNOSIS — R570 Cardiogenic shock: Secondary | ICD-10-CM | POA: Diagnosis present

## 2023-07-26 HISTORY — DX: Heart failure, unspecified: I50.9

## 2023-07-26 HISTORY — DX: Acute myocardial infarction, unspecified: I21.9

## 2023-07-26 HISTORY — DX: Sleep apnea, unspecified: G47.30

## 2023-07-26 HISTORY — DX: Essential (primary) hypertension: I10

## 2023-07-26 HISTORY — DX: Unspecified asthma, uncomplicated: J45.909

## 2023-07-26 HISTORY — DX: Chronic kidney disease, unspecified: N18.9

## 2023-07-26 HISTORY — DX: Chronic obstructive pulmonary disease, unspecified: J44.9

## 2023-07-26 HISTORY — DX: Hyperlipidemia, unspecified: E78.5

## 2023-07-26 LAB — I-STAT CHEM 8, ED
BUN: 17 mg/dL (ref 8–23)
Calcium, Ion: 1.13 mmol/L — ABNORMAL LOW (ref 1.15–1.40)
Chloride: 106 mmol/L (ref 98–111)
Creatinine, Ser: 1.5 mg/dL — ABNORMAL HIGH (ref 0.61–1.24)
Glucose, Bld: 147 mg/dL — ABNORMAL HIGH (ref 70–99)
HCT: 44 % (ref 39.0–52.0)
Hemoglobin: 15 g/dL (ref 13.0–17.0)
Potassium: 3.2 mmol/L — ABNORMAL LOW (ref 3.5–5.1)
Sodium: 142 mmol/L (ref 135–145)
TCO2: 25 mmol/L (ref 22–32)

## 2023-07-26 LAB — TSH: TSH: 0.981 u[IU]/mL (ref 0.350–4.500)

## 2023-07-26 LAB — COMPREHENSIVE METABOLIC PANEL
ALT: 53 U/L — ABNORMAL HIGH (ref 0–44)
AST: 43 U/L — ABNORMAL HIGH (ref 15–41)
Albumin: 3.7 g/dL (ref 3.5–5.0)
Alkaline Phosphatase: 84 U/L (ref 38–126)
Anion gap: 11 (ref 5–15)
BUN: 15 mg/dL (ref 8–23)
CO2: 25 mmol/L (ref 22–32)
Calcium: 9.5 mg/dL (ref 8.9–10.3)
Chloride: 104 mmol/L (ref 98–111)
Creatinine, Ser: 1.95 mg/dL — ABNORMAL HIGH (ref 0.61–1.24)
GFR, Estimated: 37 mL/min — ABNORMAL LOW (ref >60)
Glucose, Bld: 149 mg/dL — ABNORMAL HIGH (ref 70–99)
Potassium: 3.2 mmol/L — ABNORMAL LOW (ref 3.5–5.1)
Sodium: 140 mmol/L (ref 135–145)
Total Bilirubin: 0.7 mg/dL (ref <1.2)
Total Protein: 6.6 g/dL (ref 6.5–8.1)

## 2023-07-26 LAB — CBC WITH DIFFERENTIAL/PLATELET
Abs Immature Granulocytes: 0.03 10*3/uL (ref 0.00–0.07)
Basophils Absolute: 0 10*3/uL (ref 0.0–0.1)
Basophils Relative: 0 %
Eosinophils Absolute: 0.3 10*3/uL (ref 0.0–0.5)
Eosinophils Relative: 3 %
HCT: 44.6 % (ref 39.0–52.0)
Hemoglobin: 13.9 g/dL (ref 13.0–17.0)
Immature Granulocytes: 0 %
Lymphocytes Relative: 39 %
Lymphs Abs: 4.2 10*3/uL — ABNORMAL HIGH (ref 0.7–4.0)
MCH: 28.4 pg (ref 26.0–34.0)
MCHC: 31.2 g/dL (ref 30.0–36.0)
MCV: 91 fL (ref 80.0–100.0)
Monocytes Absolute: 0.9 10*3/uL (ref 0.1–1.0)
Monocytes Relative: 8 %
Neutro Abs: 5.3 10*3/uL (ref 1.7–7.7)
Neutrophils Relative %: 50 %
Platelets: 200 10*3/uL (ref 150–400)
RBC: 4.9 MIL/uL (ref 4.22–5.81)
RDW: 12.8 % (ref 11.5–15.5)
WBC: 10.7 10*3/uL — ABNORMAL HIGH (ref 4.0–10.5)
nRBC: 0 % (ref 0.0–0.2)

## 2023-07-26 LAB — BRAIN NATRIURETIC PEPTIDE: B Natriuretic Peptide: 234.4 pg/mL — ABNORMAL HIGH (ref 0.0–100.0)

## 2023-07-26 LAB — I-STAT VENOUS BLOOD GAS, ED
Acid-Base Excess: 0 mmol/L (ref 0.0–2.0)
Bicarbonate: 25.8 mmol/L (ref 20.0–28.0)
Calcium, Ion: 1.13 mmol/L — ABNORMAL LOW (ref 1.15–1.40)
HCT: 44 % (ref 39.0–52.0)
Hemoglobin: 15 g/dL (ref 13.0–17.0)
O2 Saturation: 90 %
Potassium: 3.2 mmol/L — ABNORMAL LOW (ref 3.5–5.1)
Sodium: 142 mmol/L (ref 135–145)
TCO2: 27 mmol/L (ref 22–32)
pCO2, Ven: 43.9 mm[Hg] — ABNORMAL LOW (ref 44–60)
pH, Ven: 7.376 (ref 7.25–7.43)
pO2, Ven: 59 mm[Hg] — ABNORMAL HIGH (ref 32–45)

## 2023-07-26 LAB — RESP PANEL BY RT-PCR (RSV, FLU A&B, COVID)  RVPGX2
Influenza A by PCR: NEGATIVE
Influenza B by PCR: NEGATIVE
Resp Syncytial Virus by PCR: NEGATIVE
SARS Coronavirus 2 by RT PCR: NEGATIVE

## 2023-07-26 LAB — TROPONIN I (HIGH SENSITIVITY)
Troponin I (High Sensitivity): 20 ng/L — ABNORMAL HIGH (ref <18)
Troponin I (High Sensitivity): 23 ng/L — ABNORMAL HIGH (ref <18)

## 2023-07-26 MED ORDER — ASPIRIN 81 MG PO CHEW
324.0000 mg | CHEWABLE_TABLET | ORAL | Status: AC
  Administered 2023-07-26: 324 mg via ORAL
  Filled 2023-07-26: qty 4

## 2023-07-26 MED ORDER — ALBUTEROL SULFATE (2.5 MG/3ML) 0.083% IN NEBU
INHALATION_SOLUTION | RESPIRATORY_TRACT | Status: AC
  Filled 2023-07-26: qty 18

## 2023-07-26 MED ORDER — MAGNESIUM SULFATE 2 GM/50ML IV SOLN
2.0000 g | Freq: Once | INTRAVENOUS | Status: AC
  Administered 2023-07-26: 2 g via INTRAVENOUS
  Filled 2023-07-26: qty 50

## 2023-07-26 MED ORDER — IPRATROPIUM-ALBUTEROL 0.5-2.5 (3) MG/3ML IN SOLN: 3.0000 mL | Freq: Four times a day (QID) | RESPIRATORY_TRACT | Status: DC

## 2023-07-26 MED ORDER — POTASSIUM CHLORIDE CRYS ER 20 MEQ PO TBCR
30.0000 meq | EXTENDED_RELEASE_TABLET | Freq: Once | ORAL | Status: AC
  Administered 2023-07-26: 30 meq via ORAL
  Filled 2023-07-26: qty 1

## 2023-07-26 MED ORDER — SODIUM CHLORIDE 0.9% FLUSH
3.0000 mL | Freq: Two times a day (BID) | INTRAVENOUS | Status: DC
  Administered 2023-07-26 – 2023-07-29 (×6): 3 mL via INTRAVENOUS

## 2023-07-26 MED ORDER — ACETAMINOPHEN 500 MG PO TABS
1000.0000 mg | ORAL_TABLET | Freq: Four times a day (QID) | ORAL | Status: DC | PRN
  Administered 2023-07-29 – 2023-07-30 (×2): 1000 mg via ORAL
  Filled 2023-07-26 (×2): qty 2

## 2023-07-26 MED ORDER — IPRATROPIUM-ALBUTEROL 0.5-2.5 (3) MG/3ML IN SOLN: 3.0000 mL | Freq: Four times a day (QID) | RESPIRATORY_TRACT | Status: DC | PRN

## 2023-07-26 MED ORDER — FUROSEMIDE 10 MG/ML IJ SOLN
80.0000 mg | INTRAMUSCULAR | Status: AC
  Administered 2023-07-26: 80 mg via INTRAVENOUS
  Filled 2023-07-26: qty 8

## 2023-07-26 MED ORDER — DILTIAZEM LOAD VIA INFUSION
10.0000 mg | Freq: Once | INTRAVENOUS | Status: AC
  Administered 2023-07-26: 10 mg via INTRAVENOUS
  Filled 2023-07-26: qty 10

## 2023-07-26 MED ORDER — POTASSIUM CHLORIDE CRYS ER 20 MEQ PO TBCR
40.0000 meq | EXTENDED_RELEASE_TABLET | Freq: Once | ORAL | Status: AC
  Administered 2023-07-27: 40 meq via ORAL
  Filled 2023-07-26: qty 2

## 2023-07-26 MED ORDER — FUROSEMIDE 10 MG/ML IJ SOLN: 40.0000 mg | Freq: Once | INTRAMUSCULAR | Status: DC

## 2023-07-26 MED ORDER — DILTIAZEM HCL-DEXTROSE 125-5 MG/125ML-% IV SOLN (PREMIX)
5.0000 mg/h | INTRAVENOUS | Status: DC
  Administered 2023-07-26: 5 mg/h via INTRAVENOUS
  Administered 2023-07-27: 15 mg/h via INTRAVENOUS
  Filled 2023-07-26 (×2): qty 125

## 2023-07-26 MED ORDER — ALBUTEROL SULFATE (2.5 MG/3ML) 0.083% IN NEBU
2.5000 mg | INHALATION_SOLUTION | RESPIRATORY_TRACT | Status: DC | PRN
Start: 2023-07-26 — End: 2023-07-26

## 2023-07-26 MED ORDER — IPRATROPIUM BROMIDE 0.02 % IN SOLN
1.0000 mg | Freq: Once | RESPIRATORY_TRACT | Status: AC
  Administered 2023-07-26: 1 mg via RESPIRATORY_TRACT
  Filled 2023-07-26: qty 5

## 2023-07-26 MED ORDER — ASPIRIN 81 MG PO CHEW
81.0000 mg | CHEWABLE_TABLET | Freq: Every day | ORAL | Status: DC
  Administered 2023-07-27 – 2023-07-28 (×2): 81 mg via ORAL
  Filled 2023-07-26 (×2): qty 1

## 2023-07-26 MED ORDER — ISOSORBIDE MONONITRATE ER 30 MG PO TB24
30.0000 mg | ORAL_TABLET | Freq: Every day | ORAL | Status: DC
  Administered 2023-07-27 – 2023-07-30 (×4): 30 mg via ORAL
  Filled 2023-07-26 (×4): qty 1

## 2023-07-26 MED ORDER — HEPARIN BOLUS VIA INFUSION
4500.0000 [IU] | Freq: Once | INTRAVENOUS | Status: AC
  Administered 2023-07-26: 4500 [IU] via INTRAVENOUS
  Filled 2023-07-26: qty 4500

## 2023-07-26 MED ORDER — HEPARIN (PORCINE) 25000 UT/250ML-% IV SOLN
1250.0000 [IU]/h | INTRAVENOUS | Status: DC
Start: 2023-07-26 — End: 2023-07-29
  Administered 2023-07-26: 1250 [IU]/h via INTRAVENOUS
  Administered 2023-07-27 – 2023-07-28 (×2): 1200 [IU]/h via INTRAVENOUS
  Filled 2023-07-26 (×3): qty 250

## 2023-07-26 MED ORDER — ATORVASTATIN CALCIUM 40 MG PO TABS
40.0000 mg | ORAL_TABLET | Freq: Every day | ORAL | Status: DC
  Administered 2023-07-27 – 2023-08-01 (×6): 40 mg via ORAL
  Filled 2023-07-26 (×6): qty 1

## 2023-07-26 MED ORDER — ALBUTEROL SULFATE (2.5 MG/3ML) 0.083% IN NEBU
15.0000 mg | INHALATION_SOLUTION | Freq: Once | RESPIRATORY_TRACT | Status: AC
  Administered 2023-07-26: 15 mg via RESPIRATORY_TRACT

## 2023-07-26 NOTE — Progress Notes (Signed)
1 hour neb complete. Took patient off neb and BIPAP. Pt is on RA sat 99%. Pt states that he is feeling much better now. NAD, BBS clear, no wheezing. Monitor alarming A fIb with HR of 145, MD notified, MD stated patient has drip that can be titrated.

## 2023-07-26 NOTE — ED Provider Notes (Signed)
[place] EMERGENCY DEPARTMENT AT [facility] Provider Note   CSN: [id number] Arrival date & time: 07/26/23  1805     History  Chief Complaint  Patient presents with   Respiratory Distress    Kevin Heath is a 67 y.o. male with a PMH of HTN, COPD, CHF who presented to the ED for shortness of breath.  Patient reports for the past 10 days, he has had a cough and slowly increasing shortness of breath.  He reports it acutely became worse today and was unrelieved by his rescue inhaler.  Per EMS, they administered him Solu-Medrol and DuoNebs.  Patient otherwise denies chest pain, abdominal pain, vomiting, diarrhea.  Denies increased leg swelling.  Denies history of PE or taking blood thinners.  HPI     Home Medications Prior to Admission medications   Not on File      Allergies    Ace inhibitors and Sertraline hcl    Review of Systems   Review of Systems  Physical Exam Updated Vital Signs BP 127/86   Pulse 80   Temp 98.1 F (36.7 C) (Oral)   Resp (!) 34   Ht 5\' 6"  (1.676 m)   Wt 98.9 kg   SpO2 96%   BMI 35.19 kg/m  Physical Exam Constitutional:      Appearance: He is ill-appearing.  HENT:     Head: Normocephalic and atraumatic.     Nose: Nose normal.     Mouth/Throat:     Mouth: Mucous membranes are moist.     Pharynx: Oropharynx is clear.  Eyes:     Pupils: Pupils are equal, round, and reactive to light.  Cardiovascular:     Heart sounds: No murmur heard.    No friction rub. No gallop.  Pulmonary:     Breath sounds: No stridor. Wheezing present. No rhonchi or rales.     Comments: Increased work of breathing with diffuse wheezes in all fields Abdominal:     Palpations: Abdomen is soft.     Tenderness: There is no abdominal tenderness. There is no guarding or rebound.  Musculoskeletal:     Cervical back: Normal range of motion and neck supple.     Right lower leg: Edema present.     Left lower leg: Edema present.     Comments: Trace  bilateral pitting edema  Skin:    General: Skin is warm and dry.  Neurological:     General: No focal deficit present.     Mental Status: He is alert.     Comments: Moving all 4 extremities equally     ED Results / Procedures / Treatments   Labs (all labs ordered are listed, but only abnormal results are displayed) Labs Reviewed  CBC WITH DIFFERENTIAL/PLATELET - Abnormal; Notable for the following components:      Result Value   WBC 10.7 (*)    Lymphs Abs 4.2 (*)    All other components within normal limits  COMPREHENSIVE METABOLIC PANEL - Abnormal; Notable for the following components:   Potassium 3.2 (*)    Glucose, Bld 149 (*)    Creatinine, Ser 1.95 (*)    AST 43 (*)    ALT 53 (*)    GFR, Estimated 37 (*)    All other components within normal limits  BRAIN NATRIURETIC PEPTIDE - Abnormal; Notable for the following components:   B Natriuretic Peptide 234.4 (*)    All other components within normal limits  I-STAT CHEM 8, ED -  Abnormal; Notable for the following components:   Potassium 3.2 (*)    Creatinine, Ser 1.50 (*)    Glucose, Bld 147 (*)    Calcium, Ion 1.13 (*)    All other components within normal limits  I-STAT VENOUS BLOOD GAS, ED - Abnormal; Notable for the following components:   pCO2, Ven 43.9 (*)    pO2, Ven 59 (*)    Potassium 3.2 (*)    Calcium, Ion 1.13 (*)    All other components within normal limits  TROPONIN I (HIGH SENSITIVITY) - Abnormal; Notable for the following components:   Troponin I (High Sensitivity) 20 (*)    All other components within normal limits  TROPONIN I (HIGH SENSITIVITY) - Abnormal; Notable for the following components:   Troponin I (High Sensitivity) 23 (*)    All other components within normal limits  RESP PANEL BY RT-PCR (RSV, FLU A&B, COVID)  [id number]    EKG EKG Interpretation Date/Time:  Saturday July 26 2023 18:57:00 EST Ventricular Rate:  171 PR Interval:  49 QRS Duration:  109 QT Interval:  313 QTC  Calculation: 528 R Axis:   37  Text Interpretation: afib Anteroseptal infarct, age indeterminate Artifact in lead(s) I II aVR aVL aVF V1 V2 afib new since previous Confirmed by Richardean Canal [id number]) on 07/26/2023 7:08:33 PM  Radiology DG Chest Portable 1 View  Result Date: 07/26/2023 CLINICAL DATA:  Shortness of breath EXAM: PORTABLE CHEST 1 VIEW COMPARISON:  Chest x-ray 01/11/2023 FINDINGS: The heart is enlarged. There central pulmonary vascular congestion and some streaky perihilar opacities. There is no pleural effusion or pneumothorax. No acute fractures are seen. IMPRESSION: Cardiomegaly with central pulmonary vascular congestion and streaky perihilar opacities, likely pulmonary edema. Electronically Signed   By: Darliss Cheney M.D.   On: 07/26/2023 19:20    Procedures Procedures    Medications Ordered in ED Medications  albuterol (PROVENTIL) (2.5 MG/3ML) 0.083% nebulizer solution (  Not Given 07/26/23 1831)  diltiazem (CARDIZEM) 1 mg/mL load via infusion 10 mg (10 mg Intravenous Bolus from Bag 07/26/23 1918)    And  diltiazem (CARDIZEM) 125 mg in dextrose 5% 125 mL (1 mg/mL) infusion (15 mg/hr Intravenous Rate/Dose Change 07/26/23 2031)  furosemide (LASIX) injection 40 mg (has no administration in time range)  albuterol (PROVENTIL) (2.5 MG/3ML) 0.083% nebulizer solution 15 mg (15 mg Nebulization Given 07/26/23 1830)  ipratropium (ATROVENT) nebulizer solution 1 mg (1 mg Nebulization Given 07/26/23 1831)  magnesium sulfate IVPB 2 g 50 mL (0 g Intravenous Stopped 07/26/23 2000)  potassium chloride (KLOR-CON M) CR tablet 30 mEq (30 mEq Oral Given 07/26/23 2101)    ED Course/ Medical Decision Making/ A&P                                 Medical Decision Making Amount and/or Complexity of Data Reviewed Labs: ordered. Radiology: ordered. ECG/medicine tests: ordered.  Risk Prescription drug management. Decision regarding hospitalization.   Blood gas with pH 7.37, pCO2 44.  CMP  with elevated creatinine at 1.95 and hypokalemia with potassium 3.2.  CBC with WBC 10.7, no anemia.  BNP 234.  Troponin 20, 23 on recheck.  Patient placed on BiPAP with 1 hour of continuous albuterol.  While on BiPAP, he went into atrial fibrillation in the 170s.  Patient denied having history of A-fib and he was administered diltiazem and started on a drip.  His rates were subsequently in the  100s-110s.  He was weaned off BiPAP afterwards and reported significant improvement of his symptoms and upon reassessment, he has normal work of breathing with clear lung sounds bilaterally.  Patient was discussed with the hospitalist service, who is accepted the patient for admission.        Final Clinical Impression(s) / ED Diagnoses Final diagnoses:  COPD exacerbation [facility])    Rx / DC Orders ED Discharge Orders     None         Janyth Pupa, MD 07/26/23 2118    Charlynne Pander, MD 07/29/23 2046

## 2023-07-26 NOTE — Consult Note (Incomplete)
Cardiology Consultation   Patient ID: Kevin Heath MRN: [id number]; DOB: [birth year]  Admit date: 07/26/2023 Date of Consult: 07/26/2023  PCP:  Clinic, [facility] Providers Cardiologist:  None   { Click here to update Heath or APP on Care Team, Refresh:1}     Patient Profile:   Kevin Heath is a 67 y.o. male with a hx of HTN, HLD, CKD, OSA, HFimpEF, CAD, COPD who is being seen 07/26/2023 for the evaluation of CP, SOB and AF RVR at the request of Dolly Rias Heath.  History of Present Illness:   Kevin Heath was in his usual state of health until approximately 2 weeks ago when he had abrupt onset of severe, pressure-like, exertional, non-radiating left-sided chest pain that was associated with shortness of breath and diaphoresis and improved with rest. Since then, he has continues to have some intermittent CP and progressive exertional dyspnea and cough, along with possible worsening LE edema.  ***    Past Medical History:  Diagnosis Date  . Asthma   . CHF (congestive heart failure) (HCC)   . CKD (chronic kidney disease)   . COPD (chronic obstructive pulmonary disease) (HCC)   . HLD (hyperlipidemia)   . HTN (hypertension)   . MI (myocardial infarction) (HCC)   . Sleep apnea     Past Surgical History:  Procedure Laterality Date  . NO PAST SURGERIES       Home Medications:  Prior to Admission medications   Medication Sig Start Date End Date Taking? Authorizing Provider  albuterol (VENTOLIN HFA) 108 (90 Base) MCG/ACT inhaler Inhale 2 puffs into the lungs every 6 (six) hours as needed for shortness of breath. 03/21/23  Yes Provider, Historical, Heath  amLODipine (NORVASC) 10 MG tablet Take 10 mg by mouth daily. 06/20/10  Yes Provider, Historical, Heath  aspirin 81 MG chewable tablet Chew 81 mg by mouth daily. 11/24/08  Yes Provider, Historical, Heath  cetirizine (ZYRTEC) 10 MG tablet Take 10 mg by mouth daily as needed for allergies. 12/19/22  Yes  Provider, Historical, Heath  eplerenone (INSPRA) 50 MG tablet Take 50 mg by mouth daily. 12/19/22  Yes Provider, Historical, Heath  furosemide (LASIX) 40 MG tablet Take 80 mg by mouth daily. 01/22/23  Yes Provider, Historical, Heath  hydrALAZINE (APRESOLINE) 25 MG tablet Take 25 mg by mouth daily. 12/19/22  Yes Provider, Historical, Heath  isosorbide mononitrate (IMDUR) 30 MG 24 hr tablet Take 30 mg by mouth daily. 12/19/22  Yes Provider, Historical, Heath  magnesium oxide (MAG-OX) 400 MG tablet Take 400 mg by mouth daily. 12/19/22  Yes Provider, Historical, Heath  potassium chloride (KLOR-CON) 10 MEQ tablet Take 30 mEq by mouth 2 (two) times daily. 12/19/22  Yes Provider, Historical, Heath  simvastatin (ZOCOR) 10 MG tablet Take 10 mg by mouth daily. 12/19/22  Yes Provider, Historical, Heath    Inpatient Medications: Scheduled Meds: . albuterol      . [START ON 07/27/2023] aspirin  81 mg Oral Daily  . [START ON 07/27/2023] atorvastatin  40 mg Oral Daily  . [START ON 07/27/2023] isosorbide mononitrate  30 mg Oral Daily  . [START ON 07/27/2023] potassium chloride  40 mEq Oral Once  . sodium chloride flush  3 mL Intravenous Q12H   Continuous Infusions: . diltiazem (CARDIZEM) infusion 15 mg/hr (07/26/23 2031)  . heparin 1,250 Units/hr (07/26/23 2222)   PRN Meds: acetaminophen, albuterol, ipratropium-albuterol  Allergies:    Allergies  Allergen Reactions  . Angiotensin Anaphylaxis  .  Ace Inhibitors Swelling  . Lisinopril Swelling    Respiratory arrest  . Sertraline Hcl     Lightheaded  . Spironolactone     Engorgement of breasts    Social History:   Social History   Socioeconomic History  . Marital status: Significant Other    Spouse name: Not on file  . Number of children: Not on file  . Years of education: Not on file  . Highest education level: Not on file  Occupational History  . Not on file  Tobacco Use  . Smoking status: Unknown  . Smokeless tobacco: Not on file  Substance and Sexual Activity   . Alcohol use: Not Currently  . Drug use: Not on file  . Sexual activity: Not on file  Other Topics Concern  . Not on file  Social History Narrative  . Not on file   Social Determinants of Health   Financial Resource Strain: Not on file  Food Insecurity: No Food Insecurity (07/26/2023)   Hunger Vital Sign   . Worried About Programme researcher, broadcasting/film/video in the Last Year: Never true   . Ran Out of Food in the Last Year: Never true  Transportation Needs: No Transportation Needs (07/26/2023)   PRAPARE - Transportation   . Lack of Transportation (Medical): No   . Lack of Transportation (Non-Medical): No  Physical Activity: Not on file  Stress: Not on file  Social Connections: Unknown (04/29/2023)   [facility]   Social Network   . Social Network: Not on file  Intimate Partner Violence: Not At Risk (07/26/2023)   Humiliation, Afraid, Rape, and Kick questionnaire   . Fear of Current or Ex-Partner: No   . Emotionally Abused: No   . Physically Abused: No   . Sexually Abused: No    Family History:   History reviewed. No pertinent family history.   ROS:  Please see the history of present illness.  All other ROS reviewed and negative.     Physical Exam/Data:   Vitals:   07/26/23 2115 07/26/23 2140 07/26/23 2145 07/26/23 2257  BP: 113/74  126/75 125/72  Pulse: 73  66 75  Resp: (!) 32  (!) 27 (!) 21  Temp:    98.7 F (37.1 C)  TempSrc:    Oral  SpO2: 97%  97% 96%  Weight:  98.9 kg    Height:  5\' 6"  (1.676 m)      Intake/Output Summary (Last 24 hours) at 07/26/2023 2358 Last data filed at 07/26/2023 2224 Gross per 24 hour  Intake 50 ml  Output 150 ml  Net -100 ml      07/26/2023    9:40 PM 07/26/2023    6:19 PM 07/26/2023    6:18 PM  Last 3 Weights  Weight (lbs) 218 lb 0.6 oz 218 lb 214 lb 1.1 oz  Weight (kg) 98.9 kg 98.884 kg 97.1 kg     Body mass index is 35.19 kg/m.  General:  Well nourished, well developed, in no acute distress HEENT: normal Neck:  JVP elevated to the *** Vascular: No carotid bruits; Distal pulses 2+ bilaterally Cardiac:  normal S1, S2; irregularly irregular; no murmur Lungs:  clear to auscultation bilaterally, no wheezing, rhonchi or rales  Abd: soft, nontender, no hepatomegaly  Ext: 1+ edema Musculoskeletal:  No deformities, BUE and BLE strength normal and equal Skin: warm and dry  Neuro:  CNs 2-12 intact, no focal abnormalities noted Psych:  Normal affect   EKG:  The  EKG was personally reviewed and demonstrates:  AF RVR 118bpm Telemetry:  Telemetry was personally reviewed and demonstrates:  AF RVR  Relevant CV Studies: TTE pending  Laboratory Data:  High Sensitivity Troponin:   Recent Labs  Lab 07/26/23 1825 07/26/23 2030  TROPONINIHS 20* 23*     Chemistry Recent Labs  Lab 07/26/23 1825 07/26/23 1832  NA 140 142  142  K 3.2* 3.2*  3.2*  CL 104 106  CO2 25  --   GLUCOSE 149* 147*  BUN 15 17  CREATININE 1.95* 1.50*  CALCIUM 9.5  --   GFRNONAA 37*  --   ANIONGAP 11  --     Recent Labs  Lab 07/26/23 1825  PROT 6.6  ALBUMIN 3.7  AST 43*  ALT 53*  ALKPHOS 84  BILITOT 0.7   Lipids No results for input(s): "CHOL", "TRIG", "HDL", "LABVLDL", "LDLCALC", "CHOLHDL" in the last 168 hours.  Hematology Recent Labs  Lab 07/26/23 1825 07/26/23 1832  WBC 10.7*  --   RBC 4.90  --   HGB 13.9 15.0  15.0  HCT 44.6 44.0  44.0  MCV 91.0  --   MCH 28.4  --   MCHC 31.2  --   RDW 12.8  --   PLT 200  --    Thyroid  Recent Labs  Lab 07/26/23 2030  TSH 0.981    BNP Recent Labs  Lab 07/26/23 1825  BNP 234.4*    DDimer No results for input(s): "DDIMER" in the last 168 hours.   Radiology/Studies:  DG Abd 1 View  Result Date: 07/26/2023 CLINICAL DATA:  Abdominal pain EXAM: ABDOMEN - 1 VIEW COMPARISON:  None Available. FINDINGS: No dilated loops of bowel to suggest obstruction. No radio-opaque calculi or other significant radiographic abnormality are seen. IMPRESSION: Negative.  Electronically Signed   By: Minerva Fester M.D.   On: 07/26/2023 22:35   DG Chest Portable 1 View  Result Date: 07/26/2023 CLINICAL DATA:  Shortness of breath EXAM: PORTABLE CHEST 1 VIEW COMPARISON:  Chest x-ray 01/11/2023 FINDINGS: The heart is enlarged. There central pulmonary vascular congestion and some streaky perihilar opacities. There is no pleural effusion or pneumothorax. No acute fractures are seen. IMPRESSION: Cardiomegaly with central pulmonary vascular congestion and streaky perihilar opacities, likely pulmonary edema. Electronically Signed   By: Darliss Cheney M.D.   On: 07/26/2023 19:20     Assessment and Plan:   ***   Risk Assessment/Risk Scores:  {Complete the following score calculators/questions to meet required metrics.  Press F2         :[id number]}   {Is the patient being seen for unstable angina, ACS, NSTEMI or STEMI?[phone]} {Does this patient have CHF or CHF symptoms?      :[phone]} {Does this patient have ATRIAL FIBRILLATION?:[id number]}  {Are we signing off today?:[id number]}  For questions or updates, please contact  [facility] Please consult [email or web address] for contact info under    Signed, [place]ake [place], Heath  07/26/2023 11:58 PM

## 2023-07-26 NOTE — H&P (Signed)
History and Physical    Kevin Heath HQI:[id number] DOB: [birth year] DOA: 07/26/2023  PCP: [facility]   Patient coming from: Home   Chief Complaint:  Chief Complaint  Patient presents with   Respiratory Distress    HPI:  Kevin Heath is a 67 y.o. male, [facility], with hx of reported heart failure improved EF, MI, COPD, hypertension, hyperlipidemia, CKD, OSA, who presents with progressive chest pain and shortness of breath.  Reports that he has no chronic chest pain.  2 weeks ago abruptly developed exertional chest pain, localized to his left chest, described as dull pressure and severe.  Resolves with rest.  Has not taken nitroglycerin.  Has not had similar chest pain before.  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  Thinks that he had a catheterization around this time but unclear on results.  Do not believe he has a stent.  Associated he has had shortness of breath, diaphoresis.  He has chronic orthopnea and sleeps in a recliner.  Possibly worse lower extremity edema.  Reports eating more salty food recently.  Reports cough although nonproductive.  No associated sick contacts.  No known history of A-fib in the past.  Reports taking aspirin 81 mg daily, Lasix 80 mg p.o. daily.  Not sure about other medications awaiting pharmacy rec.   Review of Systems:  ROS complete and negative except as marked above   Allergies  Allergen Reactions   Angiotensin Anaphylaxis   Ace Inhibitors Swelling   Lisinopril Swelling    Respiratory arrest   Sertraline Hcl     Lightheaded   Spironolactone     Engorgement of breasts    Prior to Admission medications   Medication Sig Start Date End Date Taking? Authorizing Provider  albuterol (VENTOLIN HFA) 108 (90 Base) MCG/ACT inhaler Inhale 2 puffs into the lungs every 6 (six) hours as needed for shortness of breath. 03/21/23  Yes [provider]  amLODipine (NORVASC) 10 MG tablet Take 10 mg by mouth  daily. 06/20/10  Yes [provider]  aspirin 81 MG chewable tablet Chew 81 mg by mouth daily. 11/24/08  Yes [provider]  cetirizine (ZYRTEC) 10 MG tablet Take 10 mg by mouth daily as needed for allergies. 12/19/22  Yes [provider]  eplerenone (INSPRA) 50 MG tablet Take 50 mg by mouth daily. 12/19/22  Yes [provider]  furosemide (LASIX) 40 MG tablet Take 80 mg by mouth daily. 01/22/23  Yes [provider]  hydrALAZINE (APRESOLINE) 25 MG tablet Take 25 mg by mouth daily. 12/19/22  Yes [provider]  isosorbide mononitrate (IMDUR) 30 MG 24 hr tablet Take 30 mg by mouth daily. 12/19/22  Yes [provider]  magnesium oxide (MAG-OX) 400 MG tablet Take 400 mg by mouth daily. 12/19/22  Yes [provider]  potassium chloride (KLOR-CON) 10 MEQ tablet Take 30 mEq by mouth 2 (two) times daily. 12/19/22  Yes [provider]  simvastatin (ZOCOR) 10 MG tablet Take 10 mg by mouth daily. 12/19/22  Yes [provider]    Past Medical History:  Diagnosis Date   Asthma    CHF (congestive heart failure) (HCC)    CKD (chronic kidney disease)    COPD (chronic obstructive pulmonary disease) (HCC)    HLD (hyperlipidemia)    HTN (hypertension)    MI (myocardial infarction) (HCC)    Sleep apnea     Past Surgical History:  Procedure Laterality Date  NO PAST SURGERIES       reports that he does not currently use alcohol. No history on file for tobacco use and drug use.  History reviewed. No pertinent family history.   Physical Exam: Vitals:   07/26/23 2115 07/26/23 2140 07/26/23 2145 07/26/23 2257  BP: 113/74  126/75 125/72  Pulse: 73  66 75  Resp: (!) 32  (!) 27 (!) 21  Temp:    98.7 F (37.1 C)  TempSrc:    Oral  SpO2: 97%  97% 96%  Weight:  98.9 kg    Height:  5\' 6"  (1.676 m)      Gen: Awake, alert, mild respiratory distress Neck: 10 cm of JVD while upright CV: Tachycardic, irregular, normal S1,  S2, no murmurs  Resp: Mild respiratory distress, tachypneic, accessory muscle use, rales in the lower half.  Abd: Obese, slightly distended, normoactive, soft, nontender.  MSK: Symmetric, 2+ edema to the knee Skin: No rashes or lesions to exposed skin  Neuro: Alert and interactive  Psych: euthymic, appropriate    Data review:   Labs reviewed, notable for:   Blood gas 7.37/43, bicarb 25 Creatinine 1.9 K3.2 BNP 234 Troponin 20 WBC 10  Micro:  Results for orders placed or performed during the hospital encounter of 07/26/23  Resp panel by RT-PCR (RSV, Flu A&B, Covid) Anterior Nasal Swab     Status: None   Collection Time: 07/26/23  6:19 PM   Specimen: Anterior Nasal Swab  Result Value Ref Range Status   SARS Coronavirus 2 by RT PCR NEGATIVE NEGATIVE Final   Influenza A by PCR NEGATIVE NEGATIVE Final   Influenza B by PCR NEGATIVE NEGATIVE Final    Comment: (NOTE) The Xpert Xpress SARS-CoV-2/FLU/RSV plus assay is intended as an aid in the diagnosis of influenza from Nasopharyngeal swab specimens and should not be used as a sole basis for treatment. Nasal washings and aspirates are unacceptable for Xpert Xpress SARS-CoV-2/FLU/RSV testing.  Fact Sheet for Patients: BloggerCourse.com  Fact Sheet for Healthcare Providers: SeriousBroker.it  This test is not yet approved or cleared by the [place] FDA and has been authorized for detection and/or diagnosis of SARS-CoV-2 by FDA under an Emergency Use Authorization (EUA). This EUA will remain in effect (meaning this test can be used) for the duration of the COVID-19 declaration under Section 564(b)(1) of the Act, 21 U.S.C. section 360bbb-3(b)(1), unless the authorization is terminated or revoked.     Resp Syncytial Virus by PCR NEGATIVE NEGATIVE Final    Comment: (NOTE) Fact Sheet for Patients: BloggerCourse.com  Fact Sheet for Healthcare  Providers: SeriousBroker.it  This test is not yet approved or cleared by the [place] FDA and has been authorized for detection and/or diagnosis of SARS-CoV-2 by FDA under an Emergency Use Authorization (EUA). This EUA will remain in effect (meaning this test can be used) for the duration of the COVID-19 declaration under Section 564(b)(1) of the Act, 21 U.S.C. section 360bbb-3(b)(1), unless the authorization is terminated or revoked.  Performed at [facility], [address] [place], [place] [address]     Imaging reviewed:  DG Abd 1 View  Result Date: 07/26/2023 CLINICAL DATA:  Abdominal pain EXAM: ABDOMEN - 1 VIEW COMPARISON:  None Available. FINDINGS: No dilated loops of bowel to suggest obstruction. No radio-opaque calculi or other significant radiographic abnormality are seen. IMPRESSION: Negative. Electronically Signed   By: Minerva Fester M.D.   On: 07/26/2023 22:35   DG Chest Portable 1 View  Result Date: 07/26/2023 CLINICAL DATA:  Shortness of breath EXAM: PORTABLE CHEST 1 VIEW COMPARISON:  Chest x-ray 01/11/2023 FINDINGS: The heart is enlarged. There central pulmonary vascular congestion and some streaky perihilar opacities. There is no pleural effusion or pneumothorax. No acute fractures are seen. IMPRESSION: Cardiomegaly with central pulmonary vascular congestion and streaky perihilar opacities, likely pulmonary edema. Electronically Signed   By: Darliss Cheney M.D.   On: 07/26/2023 19:20    EKG:  Reviewed initial EKG and subsequent.  Initially A-fib with RVR 171, possible minimal ST depression inferolaterally, artifact limiting tracing evaluation.  ED Course:  Treated with methylprednisolone 125 mg IV x 1 by EMS, treated with nebs, diltiazem 10 mg and started on drip, potassium, magnesium.   Assessment/Plan:  67 y.o. male with hx [facility], with hx of reported heart failure improved EF, MI, COPD, hypertension, hyperlipidemia,  CKD, OSA, who presents with progressive chest pain and shortness of breath.  Concern for possible underlying unstable angina.  Found to have acute exacerbation of heart failure and new onset A-fib.  Concern for possible unstable angina Abrupt onset of exertional chest pain 2 weeks ago relieved by rest.  Not tried nitroglycerin, concerning for anginal type symptoms.  Reports history of MI in the past and prior catheterization although results are unknown.  Does take aspirin daily.  Concerned that ischemic disease may be the precipitant for his heart failure and A-fib RVR although it may be reversed that his tachyarrhythmia and heart failure are driving demand events.  High-sensitivity troponin low-level elevation at 20 -> 23.  EKG with A-fib RVR possible related to ischemic changes with minimal ST depressions in the inferolateral leads. -Cardiology fellow consulted to see overnight, pending recs. -Loaded with aspirin 324 mg x 1, continue 81 mg daily -Will start on heparin drip which should cover for both potential ACS as well as A-fib anticoagulation - Check lipids and A1c - Switch simvastatin to atorvastatin 40 mg - No beta-blocker in the setting of decompensated heart failure - Keep n.p.o. for now  Acute on chronic heart failure, ejection fraction unknown Respiratory distress, secondary to above Reports history improved EF previously 25%.  Presents with respiratory distress, exam consistent with volume overload with JVD, peripheral edema.  BNP is 234.  Chest x-ray with pulm vascular congestion and pulmonary edema.  Was initially placed on BiPAP but discontinued due to normal blood gas.  However remains with increased work of breathing and pulmonary edema feel he would benefit from BiPAP restarted. - Restarted on BiPAP - Diuresis with Lasix 80 mg IV x 1, redosed as needed - TTE ordered - Daily weights, I's and O's  A-fib with RVR, new onset No prior history of A-fib.  Rates up to the 170s in the  ED.  Started on diltiazem drip with improving rate control - Continue diltiazem drip, likely transition to p.o. dilt once stabilized - Anticoagulation with heparin for possible ACS/A-fib, CHA2DS2-VASc is 3.  Transition to DOAC pending course with his possible ACS.  AKI, background CKD 3  Baseline creatinine possibly near 1.3 on labs in 8/'24.  Elevated to 1.9 on admission.  Suspect this is cardiorenal. - Check PVR - Diuresis per above  COPD, without exacerbation Treated with methylprednisolone initially and nebs for possible COPD exacerbation.  Overall clinical picture more consistent with acute heart failure.  Will direct treatment cardiac etiologies. - DuoNebs every 6 hours, albuterol neb as needed - Stopped steroids, more consistent with heart failure.  No indication for antibiotics at  this point - Follow-up RVP  Hypokalemia - Repleted  Chronic medical problems: Hypertension: Hold his home amlodipine and hydralazine to allow for room for diltiazem titration.  Continue isosorbide mononitrate 30 mg daily as this will help with possible anginal symptoms Hyperlipidemia: See statin above OSA: Currently on a BiPAP  Body mass index is 35.19 kg/m.  Obesity class II affecting medical care per above  DVT prophylaxis:  IV heparin gtts Code Status:  Full Code Diet:  Diet Orders (From admission, onward)     Start     Ordered   07/26/23 2144  Diet NPO time specified Except for: Sips with Meds, Ice Chips  Diet effective now       Question Answer Comment  Except for Sips with Meds   Except for Ice Chips      07/26/23 2152           Family Communication: Yes discussed with his fiance at the bedside Consults: Cardiology Admission status:   Inpatient, Step Down Unit  Severity of Illness: The appropriate patient status for this patient is INPATIENT. Inpatient status is judged to be reasonable and necessary in order to provide the required intensity of service to ensure the patient's  safety. The patient's presenting symptoms, physical exam findings, and initial radiographic and laboratory data in the context of their chronic comorbidities is felt to place them at high risk for further clinical deterioration. Furthermore, it is not anticipated that the patient will be medically stable for discharge from the hospital within 2 midnights of admission.   * I certify that at the point of admission it is my clinical judgment that the patient will require inpatient hospital care spanning beyond 2 midnights from the point of admission due to high intensity of service, high risk for further deterioration and high frequency of surveillance required.*   Dolly Rias, MD [facility]  How to contact the [facility] Attending or Consulting provider 7A - 7P or covering provider during after hours 7P -7A, for this patient.  Check the care team in [facility] and look for a) attending/consulting TR[facility] provider listed and b) the [facility] team listed Log into [email or web address] and use [place]'s universal password to access. If you do not have the password, please contact the hospital operator. Locate the [facility] provider you are looking for under [facility] and page to a number that you can be directly reached. If you still have difficulty reaching the provider, please page the [facility] (Director on Call) for the Hospitalists listed on amion for assistance.  07/26/2023, 11:37 PM

## 2023-07-26 NOTE — Consult Note (Signed)
Cardiology Consultation   Patient ID: Kevin Heath MRN: [id number]; DOB: June 06, 1956  Admit date: 07/26/2023 Date of Consult: 07/26/2023  PCP:  [facility] Providers Cardiologist:  None        Patient Profile:   Kevin Heath is a 67 y.o. male with a hx of HTN, HLD, CKD, OSA, HFimpEF, CAD, COPD who is being seen 07/26/2023 for the evaluation of CP, SOB and AF RVR at the request of Dolly Rias MD.  History of Present Illness:   Kevin Heath was in his usual state of health until approximately 2 weeks ago when he had abrupt onset of severe, pressure-like, exertional, non-radiating left-sided chest pain that was associated with shortness of breath and diaphoresis and improved with rest. Since then, he has continues to have some intermittent CP and progressive exertional dyspnea, orthopnea, PND and cough, along with worsening abdominal fullness and LE edema. He takes Lasix and has been taking it and urinating normally, but still noticed build up of fluid.  Upon arrival he is found to have fluid overload and also found to be in AF RVR (new diagnosis). He has no hx of AF or any other arrhythmias. No thyroid disease and no excessive caffeine use.   Past Medical History:  Diagnosis Date   Asthma    CHF (congestive heart failure) (HCC)    CKD (chronic kidney disease)    COPD (chronic obstructive pulmonary disease) (HCC)    HLD (hyperlipidemia)    HTN (hypertension)    MI (myocardial infarction) (HCC)    Sleep apnea     Past Surgical History:  Procedure Laterality Date   NO PAST SURGERIES       Home Medications:  Prior to Admission medications   Medication Sig Start Date End Date Taking? Authorizing Provider  albuterol (VENTOLIN HFA) 108 (90 Base) MCG/ACT inhaler Inhale 2 puffs into the lungs every 6 (six) hours as needed for shortness of breath. 03/21/23  Yes [provider]  amLODipine (NORVASC) 10 MG tablet Take 10 mg by  mouth daily. 06/20/10  Yes [provider]  aspirin 81 MG chewable tablet Chew 81 mg by mouth daily. 11/24/08  Yes [provider]  cetirizine (ZYRTEC) 10 MG tablet Take 10 mg by mouth daily as needed for allergies. 12/19/22  Yes [provider]  eplerenone (INSPRA) 50 MG tablet Take 50 mg by mouth daily. 12/19/22  Yes [provider]  furosemide (LASIX) 40 MG tablet Take 80 mg by mouth daily. 01/22/23  Yes [provider]  hydrALAZINE (APRESOLINE) 25 MG tablet Take 25 mg by mouth daily. 12/19/22  Yes [provider]  isosorbide mononitrate (IMDUR) 30 MG 24 hr tablet Take 30 mg by mouth daily. 12/19/22  Yes [provider]  magnesium oxide (MAG-OX) 400 MG tablet Take 400 mg by mouth daily. 12/19/22  Yes [provider]  potassium chloride (KLOR-CON) 10 MEQ tablet Take 30 mEq by mouth 2 (two) times daily. 12/19/22  Yes [provider]  simvastatin (ZOCOR) 10 MG tablet Take 10 mg by mouth daily. 12/19/22  Yes [provider]    Inpatient Medications: Scheduled Meds:  albuterol       [START ON 07/27/2023] aspirin  81 mg Oral Daily   [START ON 07/27/2023] atorvastatin  40 mg Oral Daily   [START ON 07/27/2023] isosorbide mononitrate  30 mg Oral Daily   [START ON 07/27/2023] potassium chloride  40 mEq Oral Once   sodium  chloride flush  3 mL Intravenous Q12H   Continuous Infusions:  diltiazem (CARDIZEM) infusion 15 mg/hr (07/26/23 2031)   heparin 1,250 Units/hr (07/26/23 2222)   PRN Meds: acetaminophen, albuterol, ipratropium-albuterol  Allergies:    Allergies  Allergen Reactions   Angiotensin Anaphylaxis   Ace Inhibitors Swelling   Lisinopril Swelling    Respiratory arrest   Sertraline Hcl     Lightheaded   Spironolactone     Engorgement of breasts    Social History:   Social History   Socioeconomic History   Marital status: Significant Other    Spouse name: Not on file   Number of children: Not  on file   Years of education: Not on file   Highest education level: Not on file  Occupational History   Not on file  Tobacco Use   Smoking status: Unknown   Smokeless tobacco: Not on file  Substance and Sexual Activity   Alcohol use: Not Currently   Drug use: Not on file   Sexual activity: Not on file  Other Topics Concern   Not on file  Social History Narrative   Not on file   Social Determinants of Health   Financial Resource Strain: Not on file  Food Insecurity: No Food Insecurity (07/26/2023)   Hunger Vital Sign    Worried About Running Out of Food in the Last Year: Never true    Ran Out of Food in the Last Year: Never true  Transportation Needs: No Transportation Needs (07/26/2023)   PRAPARE - Administrator, Civil Service (Medical): No    Lack of Transportation (Non-Medical): No  Physical Activity: Not on file  Stress: Not on file  Social Connections: Unknown (04/29/2023)   Received from [facility]   Social Network    Social Network: Not on file  Intimate Partner Violence: Not At Risk (07/26/2023)   Humiliation, Afraid, Rape, and Kick questionnaire    Fear of Current or Ex-Partner: No    Emotionally Abused: No    Physically Abused: No    Sexually Abused: No    Family History:   History reviewed. No pertinent family history.   ROS:  Please see the history of present illness.  All other ROS reviewed and negative.     Physical Exam/Data:   Vitals:   07/26/23 2115 07/26/23 2140 07/26/23 2145 07/26/23 2257  BP: 113/74  126/75 125/72  Pulse: 73  66 75  Resp: (!) 32  (!) 27 (!) 21  Temp:    98.7 F (37.1 C)  TempSrc:    Oral  SpO2: 97%  97% 96%  Weight:  98.9 kg    Height:  5\' 6"  (1.676 m)      Intake/Output Summary (Last 24 hours) at 07/26/2023 2358 Last data filed at 07/26/2023 2224 Gross per 24 hour  Intake 50 ml  Output 150 ml  Net -100 ml      07/26/2023    9:40 PM 07/26/2023    6:19 PM 07/26/2023    6:18 PM  Last 3 Weights   Weight (lbs) 218 lb 0.6 oz 218 lb 214 lb 1.1 oz  Weight (kg) 98.9 kg 98.884 kg 97.1 kg     Body mass index is 35.19 kg/m.  General:  Well nourished, well developed, in no acute distress HEENT: normal Neck: JVP elevated to the earlobe Vascular: No carotid bruits; Distal pulses 2+ bilaterally Cardiac:  normal S1, S2; irregularly irregular; no murmur Lungs:  clear to auscultation bilaterally, no  wheezing, rhonchi or rales  Abd: soft, nontender, no hepatomegaly  Ext: 1+ edema Musculoskeletal:  No deformities, BUE and BLE strength normal and equal Skin: warm and dry  Neuro:  CNs 2-12 intact, no focal abnormalities noted Psych:  Normal affect   EKG:  The EKG was personally reviewed and demonstrates:  AF RVR 118bpm Telemetry:  Telemetry was personally reviewed and demonstrates:  AF RVR  Relevant CV Studies: TTE pending  Laboratory Data:  High Sensitivity Troponin:   Recent Labs  Lab 07/26/23 1825 07/26/23 2030  TROPONINIHS 20* 23*     Chemistry Recent Labs  Lab 07/26/23 1825 07/26/23 1832  NA 140 142  142  K 3.2* 3.2*  3.2*  CL 104 106  CO2 25  --   GLUCOSE 149* 147*  BUN 15 17  CREATININE 1.95* 1.50*  CALCIUM 9.5  --   GFRNONAA 37*  --   ANIONGAP 11  --     Recent Labs  Lab 07/26/23 1825  PROT 6.6  ALBUMIN 3.7  AST 43*  ALT 53*  ALKPHOS 84  BILITOT 0.7   Lipids No results for input(s): "CHOL", "TRIG", "HDL", "LABVLDL", "LDLCALC", "CHOLHDL" in the last 168 hours.  Hematology Recent Labs  Lab 07/26/23 1825 07/26/23 1832  WBC 10.7*  --   RBC 4.90  --   HGB 13.9 15.0  15.0  HCT 44.6 44.0  44.0  MCV 91.0  --   MCH 28.4  --   MCHC 31.2  --   RDW 12.8  --   PLT 200  --    Thyroid  Recent Labs  Lab 07/26/23 2030  TSH 0.981    BNP Recent Labs  Lab 07/26/23 1825  BNP 234.4*    DDimer No results for input(s): "DDIMER" in the last 168 hours.   Radiology/Studies:  DG Abd 1 View  Result Date: 07/26/2023 CLINICAL DATA:  Abdominal pain  EXAM: ABDOMEN - 1 VIEW COMPARISON:  None Available. FINDINGS: No dilated loops of bowel to suggest obstruction. No radio-opaque calculi or other significant radiographic abnormality are seen. IMPRESSION: Negative. Electronically Signed   By: Minerva Fester M.D.   On: 07/26/2023 22:35   DG Chest Portable 1 View  Result Date: 07/26/2023 CLINICAL DATA:  Shortness of breath EXAM: PORTABLE CHEST 1 VIEW COMPARISON:  Chest x-ray 01/11/2023 FINDINGS: The heart is enlarged. There central pulmonary vascular congestion and some streaky perihilar opacities. There is no pleural effusion or pneumothorax. No acute fractures are seen. IMPRESSION: Cardiomegaly with central pulmonary vascular congestion and streaky perihilar opacities, likely pulmonary edema. Electronically Signed   By: Darliss Cheney M.D.   On: 07/26/2023 19:20     Assessment and Plan:   89M w/ known HFimpEF p/w typical CP and progressive orthopnea and edema, suspicious for acute on chronic HF. Possible unstable angina given typical CP. Also found to have new AF, likely 2/2 volume overload.   Acute on Chronic HFimpEF NICM Lasix 80mg  IV BID Complete TTE  Typical Chest Pain Unstable Angina Trend troponin, serial ECGs Aspirin 81mg  daily (s/p Aspirin 324mg ) Heparin gtt Plan for LHC on Monday, please keep NPO Sunday night PRN SL NTG for CP  AF RVR Currently on Diltiazem gtt Start Metoprolol Tartrate 12.5mg  q6h Continue heparin gtt  Risk Assessment/Risk Scores:     TIMI Risk Score for Unstable Angina or Non-ST Elevation MI:   The patient's TIMI risk score is  , which indicates a  % risk of all cause mortality, new or recurrent myocardial  infarction or need for urgent revascularization in the next 14 days.  New York Heart Association (NYHA) Functional Class NYHA Class II  CHA2DS2-VASc Score =     This indicates a  % annual risk of stroke. The patient's score is based upon:           For questions or updates, please contact [facility] Please consult [email or web address] for contact info under    Signed, [place] Desanctis, MD  07/26/2023 11:58 PM

## 2023-07-26 NOTE — ED Notes (Addendum)
ED TO INPATIENT HANDOFF REPORT  ED Nurse Name and Phone #:  Theadora Rama RN [phone]  S Name/Age/Gender Kevin Heath 67 y.o. male Room/Bed: 024C/024C  Code Status   Code Status: Not on file  Home/SNF/Other Home Patient oriented to: self, place, time, and situation Is this baseline? Yes   Triage Complete: Triage complete  Chief Complaint Acute exacerbation of CHF (congestive heart failure) (HCC) [I50.9]  Triage Note Pt BIB EMS from home in respiratory distress, tripod position and accessory breathing. Given 20mg  albuterol 125mg  Solu-Medrol.with improvement in wheezing and WOB.  92% O2 on EMS arrival, 99% after treatment. Hx of asthma & CHF. Aox4.    Allergies Allergies  Allergen Reactions   Ace Inhibitors     REACTION: swelling   Sertraline Hcl     REACTION: fainting spells    Level of Care/Admitting Diagnosis ED Disposition     ED Disposition  Admit   Condition  --   Comment  Hospital [facility]: [facility] [[id number]]  Level of Care: Progressive [102]  Admit to Progressive based on following criteria: CARDIOVASCULAR & THORACIC of moderate stability with acute coronary syndrome symptoms/low risk myocardial infarction/hypertensive urgency/arrhythmias/heart failure potentially compromising stability and stable post cardiovascular intervention patients.  May admit patient to [facility] or [facility] if equivalent level of care is available:: Yes  Covid Evaluation: Symptomatic Person Under Investigation (PUI) or recent exposure (last 10 days) *Testing Required*  Diagnosis: Acute exacerbation of CHF (congestive heart failure) [facility]) [[id number]]  Admitting Physician: Dolly Rias [[id number]]  Attending Physician: Dolly Rias [[id number]]  Certification:: I certify this patient will need inpatient services for at least 2 midnights  Expected Medical Readiness: 07/28/2023          B Medical/Surgery History Past Medical History:  Diagnosis Date   Asthma     CHF (congestive heart failure) (HCC)      The histories are not reviewed yet. Please review them in the "History" navigator section and refresh this SmartLink.   A IV Location/Drains/Wounds Patient Lines/Drains/Airways Status     Active Line/Drains/Airways     Name Placement date Placement time Site Days   Peripheral IV 07/26/23 18 G Left Antecubital 07/26/23  1828  Antecubital  less than 1            Intake/Output Last 24 hours  Intake/Output Summary (Last 24 hours) at 07/26/2023 2120 Last data filed at 07/26/2023 2000 Gross per 24 hour  Intake 50 ml  Output --  Net 50 ml    Labs/Imaging Results for orders placed or performed during the hospital encounter of 07/26/23 (from the past 48 hour(s))  CBC with Differential     Status: Abnormal   Collection Time: 07/26/23  6:25 PM  Result Value Ref Range   WBC 10.7 (H) 4.0 - 10.5 K/uL   RBC 4.90 4.22 - 5.81 MIL/uL   Hemoglobin 13.9 13.0 - 17.0 g/dL   HCT 21.3 08.6 - 57.8 %   MCV 91.0 80.0 - 100.0 fL   MCH 28.4 26.0 - 34.0 pg   MCHC 31.2 30.0 - 36.0 g/dL   RDW 46.9 62.9 - 52.8 %   Platelets 200 150 - 400 K/uL   nRBC 0.0 0.0 - 0.2 %   Neutrophils Relative % 50 %   Neutro Abs 5.3 1.7 - 7.7 K/uL   Lymphocytes Relative 39 %   Lymphs Abs 4.2 (H) 0.7 - 4.0 K/uL   Monocytes Relative 8 %   Monocytes Absolute 0.9 0.1 -  1.0 K/uL   Eosinophils Relative 3 %   Eosinophils Absolute 0.3 0.0 - 0.5 K/uL   Basophils Relative 0 %   Basophils Absolute 0.0 0.0 - 0.1 K/uL   Immature Granulocytes 0 %   Abs Immature Granulocytes 0.03 0.00 - 0.07 K/uL    Comment: Performed at [facility], [address], [place], [place] [address]  Comprehensive metabolic panel     Status: Abnormal   Collection Time: 07/26/23  6:25 PM  Result Value Ref Range   Sodium 140 135 - 145 mmol/L   Potassium 3.2 (L) 3.5 - 5.1 mmol/L   Chloride 104 98 - 111 mmol/L   CO2 25 22 - 32 mmol/L   Glucose, Bld 149 (H) 70 - 99 mg/dL    Comment: Glucose  reference range applies only to samples taken after fasting for at least 8 hours.   BUN 15 8 - 23 mg/dL   Creatinine, Ser 6.38 (H) 0.61 - 1.24 mg/dL   Calcium 9.5 8.9 - 75.6 mg/dL   Total Protein 6.6 6.5 - 8.1 g/dL   Albumin 3.7 3.5 - 5.0 g/dL   AST 43 (H) 15 - 41 U/L   ALT 53 (H) 0 - 44 U/L   Alkaline Phosphatase 84 38 - 126 U/L   Total Bilirubin 0.7 <1.2 mg/dL   GFR, Estimated 37 (L) >60 mL/min    Comment: (NOTE) Calculated using the CKD-EPI Creatinine Equation (2021)    Anion gap 11 5 - 15    Comment: Performed at [facility], [address], [place], [place] [address]  Troponin I (High Sensitivity)     Status: Abnormal   Collection Time: 07/26/23  6:25 PM  Result Value Ref Range   Troponin I (High Sensitivity) 20 (H) <18 ng/L    Comment: (NOTE) Elevated high sensitivity troponin I (hsTnI) values and significant  changes across serial measurements may suggest ACS but many other  chronic and acute conditions are known to elevate hsTnI results.  Refer to the "Links" section for chest pain algorithms and additional  guidance. Performed at [facility], [address], [place], [place] [address]   Brain natriuretic peptide     Status: Abnormal   Collection Time: 07/26/23  6:25 PM  Result Value Ref Range   B Natriuretic Peptide 234.4 (H) 0.0 - 100.0 pg/mL    Comment: Performed at [facility], [address], [place], [place] [address]  I-stat chem 8, ED (not at [facility], [facility] or [facility])     Status: Abnormal   Collection Time: 07/26/23  6:32 PM  Result Value Ref Range   Sodium 142 135 - 145 mmol/L   Potassium 3.2 (L) 3.5 - 5.1 mmol/L   Chloride 106 98 - 111 mmol/L   BUN 17 8 - 23 mg/dL   Creatinine, Ser 3.01 (H) 0.61 - 1.24 mg/dL   Glucose, Bld 601 (H) 70 - 99 mg/dL    Comment: Glucose reference range applies only to samples taken after fasting for at least 8 hours.   Calcium, Ion 1.13 (L) 1.15 - 1.40 mmol/L   TCO2 25 22 - 32 mmol/L   Hemoglobin 15.0 13.0 - 17.0  g/dL   HCT 09.3 23.5 - 57.3 %  I-Stat venous blood gas, ([facility] ED, [facility], [facility])     Status: Abnormal   Collection Time: 07/26/23  6:32 PM  Result Value Ref Range   pH, Ven 7.376 7.25 - 7.43   pCO2, Ven  43.9 (L) 44 - 60 mmHg   pO2, Ven 59 (H) 32 - 45 mmHg   Bicarbonate 25.8 20.0 - 28.0 mmol/L   TCO2 27 22 - 32 mmol/L   O2 Saturation 90 %   Acid-Base Excess 0.0 0.0 - 2.0 mmol/L   Sodium 142 135 - 145 mmol/L   Potassium 3.2 (L) 3.5 - 5.1 mmol/L   Calcium, Ion 1.13 (L) 1.15 - 1.40 mmol/L   HCT 44.0 39.0 - 52.0 %   Hemoglobin 15.0 13.0 - 17.0 g/dL   Sample type VENOUS   Troponin I (High Sensitivity)     Status: Abnormal   Collection Time: 07/26/23  8:30 PM  Result Value Ref Range   Troponin I (High Sensitivity) 23 (H) <18 ng/L    Comment: (NOTE) Elevated high sensitivity troponin I (hsTnI) values and significant  changes across serial measurements may suggest ACS but many other  chronic and acute conditions are known to elevate hsTnI results.  Refer to the "Links" section for chest pain algorithms and additional  guidance. Performed at [facility], [address], [place], [place] [address]    DG Chest Portable 1 View  Result Date: 07/26/2023 CLINICAL DATA:  Shortness of breath EXAM: PORTABLE CHEST 1 VIEW COMPARISON:  Chest x-ray 01/11/2023 FINDINGS: The heart is enlarged. There central pulmonary vascular congestion and some streaky perihilar opacities. There is no pleural effusion or pneumothorax. No acute fractures are seen. IMPRESSION: Cardiomegaly with central pulmonary vascular congestion and streaky perihilar opacities, likely pulmonary edema. Electronically Signed   By: Darliss Cheney M.D.   On: 07/26/2023 19:20    Pending Labs Unresulted Labs (From admission, onward)     Start     Ordered   07/26/23 1819  Resp panel by RT-PCR (RSV, Flu A&B, Covid) Anterior Nasal Swab  Once,   URGENT        07/26/23 1818            Vitals/Pain Today's Vitals   07/26/23 2015  07/26/23 2030 07/26/23 2045 07/26/23 2049  BP: 112/78 126/71 127/86   Pulse: 86 (!) 103 80   Resp: (!) 33 (!) 27 (!) 34   Temp:      TempSrc:      SpO2: 94% 96% 96%   Weight:      Height:      PainSc:    0-No pain    Isolation Precautions No active isolations  Medications Medications  albuterol (PROVENTIL) (2.5 MG/3ML) 0.083% nebulizer solution (  Not Given 07/26/23 1831)  diltiazem (CARDIZEM) 1 mg/mL load via infusion 10 mg (10 mg Intravenous Bolus from Bag 07/26/23 1918)    And  diltiazem (CARDIZEM) 125 mg in dextrose 5% 125 mL (1 mg/mL) infusion (15 mg/hr Intravenous Rate/Dose Change 07/26/23 2031)  furosemide (LASIX) injection 40 mg (has no administration in time range)  albuterol (PROVENTIL) (2.5 MG/3ML) 0.083% nebulizer solution 15 mg (15 mg Nebulization Given 07/26/23 1830)  ipratropium (ATROVENT) nebulizer solution 1 mg (1 mg Nebulization Given 07/26/23 1831)  magnesium sulfate IVPB 2 g 50 mL (0 g Intravenous Stopped 07/26/23 2000)  potassium chloride (KLOR-CON M) CR tablet 30 mEq (30 mEq Oral Given 07/26/23 2101)    Mobility walks     Focused Assessments Cardiac Assessment Handoff:  Cardiac Rhythm: Atrial fibrillation Lab Results  Component Value Date   CKTOTAL 332 (H) 10/06/2009   CKMB 2.8 10/06/2009   TROPONINI 0.03        NO INDICATION OF MYOCARDIAL INJURY. 10/06/2009  No results found for: "DDIMER" Does the Patient currently have chest pain? No   , Neuro Assessment Handoff:  Swallow screen pass? No  Cardiac Rhythm: Atrial fibrillation       Neuro Assessment:   Neuro Checks: GCS 15     Has TPA been given? No If patient is a Neuro Trauma and patient is going to OR before floor call report to 4N Charge nurse: [phone] or [phone]  , Pulmonary Assessment Handoff:  Lung sounds: Bilateral Breath Sounds: Clear, Diminished L Breath Sounds: Expiratory wheezes R Breath Sounds: Expiratory wheezes O2 Device: Room Air O2 Flow Rate (L/min): 8  L/min    R Recommendations: See Admitting Provider Note  Report given to:   Additional Notes: Patient to be placed back on bipap per verbal order from admitting MD

## 2023-07-26 NOTE — ED Triage Notes (Signed)
Pt BIB EMS from home in respiratory distress, tripod position and accessory breathing. Given 20mg  albuterol 125mg  Solu-Medrol.with improvement in wheezing and WOB.  92% O2 on EMS arrival, 99% after treatment. Hx of asthma & CHF. Aox4.

## 2023-07-26 NOTE — Progress Notes (Signed)
ANTICOAGULATION CONSULT NOTE - Initial Consult  Pharmacy Consult for Heparin Indication: atrial fibrillation  Allergies  Allergen Reactions   Ace Inhibitors     REACTION: swelling   Sertraline Hcl     REACTION: fainting spells    Patient Measurements: Height: 5\' 6"  (167.6 cm) Weight: 98.9 kg (218 lb) IBW/kg (Calculated) : 63.8 Heparin Dosing Weight: 85.5 kg  Vital Signs: Temp: 98.1 F (36.7 C) (11/23 1814) Temp Source: Oral (11/23 1814) BP: 113/74 (11/23 2115) Pulse Rate: 73 (11/23 2115)  Labs: Recent Labs    07/26/23 1825 07/26/23 1832 07/26/23 2030  HGB 13.9 15.0  15.0  --   HCT 44.6 44.0  44.0  --   PLT 200  --   --   CREATININE 1.95* 1.50*  --   TROPONINIHS 20*  --  23*    Estimated Creatinine Clearance: 53.3 mL/min (A) (by C-G formula based on SCr of 1.5 mg/dL (H)).   Medical History: Past Medical History:  Diagnosis Date   Asthma    CHF (congestive heart failure) (HCC)     Medications:  (Not in a hospital admission)  Scheduled:   albuterol       aspirin  324 mg Oral STAT   furosemide  80 mg Intravenous STAT   Infusions:   diltiazem (CARDIZEM) infusion 15 mg/hr (07/26/23 2031)   PRN: albuterol  Assessment: 67 yom with a history of HTN, COPD, HF. Patient is presenting with SOB. Heparin per pharmacy consult placed for atrial fibrillation.  Patient started on BiPAP and continuous albuterol on arrival to ED. While on BiPAP experienced AF w/ HR in the 170s.  Patient is not on anticoagulation prior to arrival.  Hgb 15; plt 200  Goal of Therapy:  Heparin level 0.3-0.7 units/ml Monitor platelets by anticoagulation protocol: Yes   Plan:  Give IV heparin 4500 units bolus x 1 Start heparin infusion at 1250 units/hr Check anti-Xa level at 0600 11/24 Daily HL while on heparin Continue to monitor H&H and platelets  Delmar Landau, PharmD, BCPS 07/26/2023 9:39 PM ED Clinical Pharmacist -  [phone]

## 2023-07-27 ENCOUNTER — Encounter (HOSPITAL_COMMUNITY): Payer: Self-pay | Admitting: Internal Medicine

## 2023-07-27 ENCOUNTER — Other Ambulatory Visit: Payer: Self-pay

## 2023-07-27 DIAGNOSIS — R079 Chest pain, unspecified: Secondary | ICD-10-CM | POA: Insufficient documentation

## 2023-07-27 DIAGNOSIS — R0603 Acute respiratory distress: Secondary | ICD-10-CM | POA: Insufficient documentation

## 2023-07-27 DIAGNOSIS — N179 Acute kidney failure, unspecified: Secondary | ICD-10-CM

## 2023-07-27 DIAGNOSIS — I4891 Unspecified atrial fibrillation: Secondary | ICD-10-CM

## 2023-07-27 DIAGNOSIS — I509 Heart failure, unspecified: Secondary | ICD-10-CM

## 2023-07-27 DIAGNOSIS — I5033 Acute on chronic diastolic (congestive) heart failure: Secondary | ICD-10-CM

## 2023-07-27 DIAGNOSIS — N189 Chronic kidney disease, unspecified: Secondary | ICD-10-CM

## 2023-07-27 DIAGNOSIS — I200 Unstable angina: Secondary | ICD-10-CM

## 2023-07-27 DIAGNOSIS — E66812 Obesity, class 2: Secondary | ICD-10-CM

## 2023-07-27 DIAGNOSIS — I2510 Atherosclerotic heart disease of native coronary artery without angina pectoris: Secondary | ICD-10-CM | POA: Diagnosis present

## 2023-07-27 LAB — CBC
HCT: 42.3 % (ref 39.0–52.0)
Hemoglobin: 13.3 g/dL (ref 13.0–17.0)
MCH: 28.2 pg (ref 26.0–34.0)
MCHC: 31.4 g/dL (ref 30.0–36.0)
MCV: 89.8 fL (ref 80.0–100.0)
Platelets: 210 10*3/uL (ref 150–400)
RBC: 4.71 MIL/uL (ref 4.22–5.81)
RDW: 12.8 % (ref 11.5–15.5)
WBC: 9.3 10*3/uL (ref 4.0–10.5)
nRBC: 0 % (ref 0.0–0.2)

## 2023-07-27 LAB — HIV ANTIBODY (ROUTINE TESTING W REFLEX): HIV Screen 4th Generation wRfx: NONREACTIVE

## 2023-07-27 LAB — URINALYSIS, ROUTINE W REFLEX MICROSCOPIC
Bacteria, UA: NONE SEEN
Bilirubin Urine: NEGATIVE
Glucose, UA: 150 mg/dL — AB
Hgb urine dipstick: NEGATIVE
Ketones, ur: 5 mg/dL — AB
Leukocytes,Ua: NEGATIVE
Nitrite: NEGATIVE
Protein, ur: 30 mg/dL — AB
Specific Gravity, Urine: 1.009 (ref 1.005–1.030)
pH: 5 (ref 5.0–8.0)

## 2023-07-27 LAB — LIPID PANEL
Cholesterol: 186 mg/dL (ref 0–200)
HDL: 56 mg/dL (ref >40)
LDL Cholesterol: 125 mg/dL — ABNORMAL HIGH (ref 0–99)
Total CHOL/HDL Ratio: 3.3 {ratio}
Triglycerides: 26 mg/dL (ref <150)
VLDL: 5 mg/dL (ref 0–40)

## 2023-07-27 LAB — BASIC METABOLIC PANEL
Anion gap: 17 — ABNORMAL HIGH (ref 5–15)
BUN: 17 mg/dL (ref 8–23)
CO2: 19 mmol/L — ABNORMAL LOW (ref 22–32)
Calcium: 9.5 mg/dL (ref 8.9–10.3)
Chloride: 102 mmol/L (ref 98–111)
Creatinine, Ser: 1.84 mg/dL — ABNORMAL HIGH (ref 0.61–1.24)
GFR, Estimated: 40 mL/min — ABNORMAL LOW (ref >60)
Glucose, Bld: 281 mg/dL — ABNORMAL HIGH (ref 70–99)
Potassium: 3.2 mmol/L — ABNORMAL LOW (ref 3.5–5.1)
Sodium: 138 mmol/L (ref 135–145)

## 2023-07-27 LAB — RAPID URINE DRUG SCREEN, HOSP PERFORMED
Amphetamines: NOT DETECTED
Barbiturates: NOT DETECTED
Benzodiazepines: NOT DETECTED
Cocaine: NOT DETECTED
Opiates: NOT DETECTED
Tetrahydrocannabinol: NOT DETECTED

## 2023-07-27 LAB — GLUCOSE, CAPILLARY
Glucose-Capillary: 211 mg/dL — ABNORMAL HIGH (ref 70–99)
Glucose-Capillary: 194 mg/dL — ABNORMAL HIGH (ref 70–99)
Glucose-Capillary: 193 mg/dL — ABNORMAL HIGH (ref 70–99)
Glucose-Capillary: 112 mg/dL — ABNORMAL HIGH (ref 70–99)
Glucose-Capillary: 158 mg/dL — ABNORMAL HIGH (ref 70–99)

## 2023-07-27 LAB — PROTIME-INR
INR: 1.2 (ref 0.8–1.2)
Prothrombin Time: 15.1 s (ref 11.4–15.2)

## 2023-07-27 LAB — HEPARIN LEVEL (UNFRACTIONATED)
Heparin Unfractionated: 0.69 [IU]/mL (ref 0.30–0.70)
Heparin Unfractionated: 0.56 [IU]/mL (ref 0.30–0.70)

## 2023-07-27 LAB — HEMOGLOBIN A1C
Hgb A1c MFr Bld: 6.1 % — ABNORMAL HIGH (ref 4.8–5.6)
Mean Plasma Glucose: 128.37 mg/dL

## 2023-07-27 LAB — LIPASE, BLOOD: Lipase: 25 U/L (ref 11–51)

## 2023-07-27 LAB — PHOSPHORUS: Phosphorus: 3 mg/dL (ref 2.5–4.6)

## 2023-07-27 LAB — TROPONIN I (HIGH SENSITIVITY): Troponin I (High Sensitivity): 49 ng/L — ABNORMAL HIGH (ref <18)

## 2023-07-27 LAB — MAGNESIUM: Magnesium: 2.1 mg/dL (ref 1.7–2.4)

## 2023-07-27 MED ORDER — FUROSEMIDE 10 MG/ML IJ SOLN
8.0000 mg/h | INTRAVENOUS | Status: DC
  Administered 2023-07-27 – 2023-07-28 (×2): 8 mg/h via INTRAVENOUS
  Filled 2023-07-27 (×2): qty 20

## 2023-07-27 MED ORDER — NITROGLYCERIN 0.4 MG SL SUBL: 0.4000 mg | SUBLINGUAL_TABLET | SUBLINGUAL | Status: DC | PRN

## 2023-07-27 MED ORDER — INSULIN ASPART 100 UNIT/ML IJ SOLN
0.0000 [IU] | Freq: Three times a day (TID) | INTRAMUSCULAR | Status: DC
  Administered 2023-07-27 – 2023-08-02 (×5): 1 [IU] via SUBCUTANEOUS

## 2023-07-27 MED ORDER — POTASSIUM CHLORIDE CRYS ER 20 MEQ PO TBCR
40.0000 meq | EXTENDED_RELEASE_TABLET | Freq: Once | ORAL | Status: AC
  Administered 2023-07-27: 40 meq via ORAL
  Filled 2023-07-27: qty 2

## 2023-07-27 MED ORDER — FUROSEMIDE 10 MG/ML IJ SOLN
80.0000 mg | Freq: Two times a day (BID) | INTRAMUSCULAR | Status: DC
  Administered 2023-07-27: 80 mg via INTRAVENOUS
  Filled 2023-07-27: qty 8

## 2023-07-27 MED ORDER — METOPROLOL TARTRATE 12.5 MG HALF TABLET
12.5000 mg | ORAL_TABLET | Freq: Four times a day (QID) | ORAL | Status: DC
  Administered 2023-07-27 – 2023-07-28 (×6): 12.5 mg via ORAL
  Filled 2023-07-27 (×6): qty 1

## 2023-07-27 NOTE — Hospital Course (Addendum)
[name] was admitted to the hospital with the working diagnosis of heart failure exacerbation   67 yo male with the past medical history of heart failure, COPD, hypertension, hyperlipidemia, CKD, and OSA who presented with progressive dyspnea and chest pain. Reported 2 weeks of angina symptoms with exertional chest pain, improved with rest. Endorsed dietary indiscretions and worsening lower extremity edema.  On his initial physical examination his blood pressure was 113/74, HR 73, RR 32 and 02 saturation 96%, lungs with rales bilaterally and increased work of breathing, heart with S1 and S2 present and tachycardic, irregularly irregular, abdomen with no distention and positive lower extremity edema.   Na 140, K 3,2 Cl 104 bicarbonate 25, glucose 149, bun 15 cr 1,95  AST 43 and ALT 53  BNP 234  High sensitive troponin 20, 23 and 49  Wbc 10,7 hgb 13,9 plt 200  Sars covid 19 negative  Urine analysis SG 1,009, protein 30, negative leukocytes and negative Hgb.  Toxicology screen negative   Chest radiograph with left rotation with bilateral hilar vascular congestion, bilateral central interstitial infiltrates, with right base atelectasis.   EKG 171 bpm, normal axis, normal intervals, atrial fibrillation rhythm with poor R R wave progression with no significant ST segment or T wave changes.   Patient was placed on IV heparin for anticoagulation and IV diltiazem for rate control.  IV furosemide for diuresis.   11/25 improvement in volume status, plan for coronary angiography tomorrow.  11/26 cardiac catheterization with low cardiac output.  Consulted advance heart failure, patient may need inotropic support.  11/27 patient on milrinone, amiodarone and furosemide drips with good toleration.  11/28 improved hemodynamics with inotropic support.  11/29 continue to improve volume status, off milrinone today.  11/30 plan for discharge home today and close follow up as outpatient.

## 2023-07-27 NOTE — Assessment & Plan Note (Addendum)
Calculated BMI is 35.1 Pre diabetes continue insulin sliding scale to cover hyperglycemia.Marland Kitchen

## 2023-07-27 NOTE — Assessment & Plan Note (Addendum)
Echocardiogram with reduced LV systolic function with EF 30%, global hypokinesis, RV systolic function preserved, LA with mild dilatation, no pericardial effusion,   11/26 cardiac catheterization  RA 16 RV 50/6  PA 58/38 mean 43-45 PCWP 38 -35  Cardiac output 3,6 and index 1,72 (Fick).   Patient had IV furosemide for diuresis, negative fluid balance was achieved - 7,661 ml with significant improvement in his symptoms.  Discharge SV02 is 72.9   Patient required inotropic support for low output heart failure.  He was placed on hydralazine and isosorbide for after load reduction.  Plan to continue diuretic therapy with eplerenone and furosemide.   Plan to follow up as outpatient, he has been advised to be adherent with medical therapy and salt/ fluid restricted diet.

## 2023-07-27 NOTE — Progress Notes (Signed)
ANTICOAGULATION CONSULT NOTE  Pharmacy Consult for Heparin Indication: atrial fibrillation  Allergies  Allergen Reactions   Angiotensin Anaphylaxis   Ace Inhibitors Swelling   Lisinopril Swelling    Respiratory arrest   Sertraline Hcl     Lightheaded   Spironolactone     Engorgement of breasts    Patient Measurements: Height: 5\' 6"  (167.6 cm) Weight: 98.7 kg (217 lb 9.5 oz) IBW/kg (Calculated) : 63.8 Heparin Dosing Weight: 86 kg  Vital Signs: Temp: 97.8 F (36.6 C) (11/24 1604) Temp Source: Oral (11/24 1604) BP: 136/90 (11/24 1604) Pulse Rate: 64 (11/24 1604)  Labs: Recent Labs    07/26/23 1825 07/26/23 1832 07/26/23 2030 07/27/23 0206 07/27/23 0810 07/27/23 1609  HGB 13.9 15.0  15.0  --  13.3  --   --   HCT 44.6 44.0  44.0  --  42.3  --   --   PLT 200  --   --  210  --   --   LABPROT  --   --   --  15.1  --   --   INR  --   --   --  1.2  --   --   HEPARINUNFRC  --   --   --   --  0.69 0.56  CREATININE 1.95* 1.50*  --  1.84*  --   --   TROPONINIHS 20*  --  23* 49*  --   --     Estimated Creatinine Clearance: 43.5 mL/min (A) (by C-G formula based on SCr of 1.84 mg/dL (H)).   Medical History: Past Medical History:  Diagnosis Date   Asthma    CHF (congestive heart failure) (HCC)    CKD (chronic kidney disease)    COPD (chronic obstructive pulmonary disease) (HCC)    HLD (hyperlipidemia)    HTN (hypertension)    MI (myocardial infarction) (HCC)    Sleep apnea    Assessment: 20 yom with a history of HTN, COPD, HF. Patient is presenting with SOB. Heparin per pharmacy consult placed for atrial fibrillation. Patient started on BiPAP and continuous albuterol on arrival to ED. While on BiPAP experienced AF w/ HR in the 170s. Patient is not on anticoagulation prior to arrival.  Heparin level therapeutic at 0.56 on 1200 units/hr. CBC ok - Hgb 13.3, PLTc 210. No signs of bleeding or issues with heparin infusion per RN.  Goal of Therapy:  Heparin level  0.3-0.7 units/ml Monitor platelets by anticoagulation protocol: Yes   Plan:  Continue heparin infusion at 1200 units/hr Check heparin level with AM labs and daily while on heparin Continue to monitor H&H and platelets Planned cath on Monday    Thank you for allowing pharmacy to be a part of this patient's care.  Thelma Barge, PharmD Clinical Pharmacist

## 2023-07-27 NOTE — Assessment & Plan Note (Addendum)
Coronary angiography with minimal coronary artery disease in a right dominant system.  Continue statin therapy, no antiplatelet therapy to avoid bleeding.    Unstable angina now has been ruled out.

## 2023-07-27 NOTE — Assessment & Plan Note (Addendum)
Telemetry patient continue with sinus rhythm with occasional PVC  Rate in the 60 bpm.      Continue oral amiodarone load.  Anticoagulation with apixaban.

## 2023-07-27 NOTE — Progress Notes (Signed)
ANTICOAGULATION CONSULT NOTE - Initial Consult  Pharmacy Consult for Heparin Indication: atrial fibrillation  Allergies  Allergen Reactions   Angiotensin Anaphylaxis   Ace Inhibitors Swelling   Lisinopril Swelling    Respiratory arrest   Sertraline Hcl     Lightheaded   Spironolactone     Engorgement of breasts    Patient Measurements: Height: 5\' 6"  (167.6 cm) Weight: 98.7 kg (217 lb 9.5 oz) IBW/kg (Calculated) : 63.8 Heparin Dosing Weight: 85.5 kg  Vital Signs: Temp: 98.1 F (36.7 C) (11/24 0435) Temp Source: Oral (11/24 0435) BP: 104/78 (11/24 0435) Pulse Rate: 69 (11/24 0435)  Labs: Recent Labs    07/26/23 1825 07/26/23 1832 07/26/23 2030 07/27/23 0206  HGB 13.9 15.0  15.0  --  13.3  HCT 44.6 44.0  44.0  --  42.3  PLT 200  --   --  210  LABPROT  --   --   --  15.1  INR  --   --   --  1.2  CREATININE 1.95* 1.50*  --  1.84*  TROPONINIHS 20*  --  23* 49*    Estimated Creatinine Clearance: 43.5 mL/min (A) (by C-G formula based on SCr of 1.84 mg/dL (H)).   Medical History: Past Medical History:  Diagnosis Date   Asthma    CHF (congestive heart failure) (HCC)    CKD (chronic kidney disease)    COPD (chronic obstructive pulmonary disease) (HCC)    HLD (hyperlipidemia)    HTN (hypertension)    MI (myocardial infarction) (HCC)    Sleep apnea     Medications:  Medications Prior to Admission  Medication Sig Dispense Refill Last Dose   albuterol (VENTOLIN HFA) 108 (90 Base) MCG/ACT inhaler Inhale 2 puffs into the lungs every 6 (six) hours as needed for shortness of breath.   07/26/2023   amLODipine (NORVASC) 10 MG tablet Take 10 mg by mouth daily.   07/26/2023 at am   aspirin 81 MG chewable tablet Chew 81 mg by mouth daily.   07/26/2023 at am   cetirizine (ZYRTEC) 10 MG tablet Take 10 mg by mouth daily as needed for allergies.   Past Week   eplerenone (INSPRA) 50 MG tablet Take 50 mg by mouth daily.   07/26/2023 at am   furosemide (LASIX) 40 MG tablet  Take 80 mg by mouth daily.   07/26/2023 at am   hydrALAZINE (APRESOLINE) 25 MG tablet Take 25 mg by mouth daily.   07/26/2023 at am   isosorbide mononitrate (IMDUR) 30 MG 24 hr tablet Take 30 mg by mouth daily.   07/26/2023 at am   magnesium oxide (MAG-OX) 400 MG tablet Take 400 mg by mouth daily.   07/26/2023 at am   potassium chloride (KLOR-CON) 10 MEQ tablet Take 30 mEq by mouth 2 (two) times daily.   07/26/2023 at am   simvastatin (ZOCOR) 10 MG tablet Take 10 mg by mouth daily.   07/26/2023 at am   Scheduled:   aspirin  81 mg Oral Daily   atorvastatin  40 mg Oral Daily   furosemide  80 mg Intravenous BID   insulin aspart  0-6 Units Subcutaneous TID WC   isosorbide mononitrate  30 mg Oral Daily   metoprolol tartrate  12.5 mg Oral Q6H   sodium chloride flush  3 mL Intravenous Q12H   Infusions:   diltiazem (CARDIZEM) infusion 15 mg/hr (07/27/23 0355)   heparin 1,250 Units/hr (07/26/23 2222)   PRN: acetaminophen, ipratropium-albuterol, nitroGLYCERIN  Assessment: 67  yom with a history of HTN, COPD, HF. Patient is presenting with SOB. Heparin per pharmacy consult placed for atrial fibrillation. Patient started on BiPAP and continuous albuterol on arrival to ED. While on BiPAP experienced AF w/ HR in the 170s. Patient is not on anticoagulation prior to arrival.  Heparin level therapeutic at 0.69 on 1250 units/hr. Will slightly decrease rate due to level being on higher end of threshold. CBC ok - Hgb 13.3, PLTc 210. No signs of bleeding or issues with heparin infusion per RN.  Goal of Therapy:  Heparin level 0.3-0.7 units/ml Monitor platelets by anticoagulation protocol: Yes   Plan:  Decrease heparin infusion to 1200 units/hr Check 6H heparin level and daily while on heparin Continue to monitor H&H and platelets Planned cath on Monday

## 2023-07-27 NOTE — Assessment & Plan Note (Addendum)
CKD stage 3 a. Hypokalemia.   Improved volume status, renal function today with serum cr at 1,79 with K at 3,6 and serum bicarbonate at 27  Na 137 and Mg 2.   Add 40 meq Kcl today.  Follow up renal function in am.

## 2023-07-27 NOTE — Evaluation (Signed)
Clinical/Bedside Swallow Evaluation Patient Details  Name: Kevin Heath MRN: [id number] Date of Birth: Jul 01, 1956  Today's Date: 07/27/2023 Time: SLP Start Time (ACUTE ONLY): 0841 SLP Stop Time (ACUTE ONLY): 0855 SLP Time Calculation (min) (ACUTE ONLY): 14 min  Past Medical History:  Past Medical History:  Diagnosis Date   Asthma    CHF (congestive heart failure) (HCC)    CKD (chronic kidney disease)    COPD (chronic obstructive pulmonary disease) (HCC)    HLD (hyperlipidemia)    HTN (hypertension)    MI (myocardial infarction) (HCC)    Sleep apnea    Past Surgical History:  Past Surgical History:  Procedure Laterality Date   NO PAST SURGERIES     HPI:  Kevin Heath is a 67 yo male presenting to ED 11/23 with progressive chest pain and SOB. Admitted with fluid overload and AF RVR. PMH includes heart failure, MI, COPD, HTN, HLD, CKD, OSA    Assessment / Plan / Recommendation  Clinical Impression  Pt reports two week history of eructation immediately following the swallow, which is consistent with presentation this date. Oral motor exam WFL. All trials of thin liquids, purees, and solids observed without overt s/s of dysphagia or aspiration. He did consistently eructate after each swallow, although he states he is not experiencing any regurgitation or reflux. Recommend initiating diet of regular texture solids with thin liquids. No further SLP f/u is warranted. Will s/o at this time. SLP Visit Diagnosis: Dysphagia, unspecified (R13.10)    Aspiration Risk  No limitations    Diet Recommendation Regular;Thin liquid    Liquid Administration via: Cup;Straw Medication Administration: Whole meds with liquid Supervision: Patient able to self feed Compensations: Slow rate;Small sips/bites Postural Changes: Seated upright at 90 degrees;Remain upright for at least 30 minutes after po intake    Other  Recommendations Recommended Consults: Consider GI evaluation Oral Care  Recommendations: Oral care BID    Recommendations for follow up therapy are one component of a multi-disciplinary discharge planning process, led by the attending physician.  Recommendations may be updated based on patient status, additional functional criteria and insurance authorization.  Follow up Recommendations No SLP follow up      Assistance Recommended at Discharge    Functional Status Assessment Patient has not had a recent decline in their functional status  Frequency and Duration            Prognosis Prognosis for improved oropharyngeal function: Good      Swallow Study   General HPI: Kevin Heath is a 67 yo male presenting to ED 11/23 with progressive chest pain and SOB. Admitted with fluid overload and AF RVR. PMH includes heart failure, MI, COPD, HTN, HLD, CKD, OSA Type of Study: Bedside Swallow Evaluation Previous Swallow Assessment: none in chart Diet Prior to this Study: NPO Temperature Spikes Noted: No Respiratory Status: Nasal cannula History of Recent Intubation: No Behavior/Cognition: Alert;Cooperative;Pleasant mood Oral Cavity Assessment: Within Functional Limits Oral Care Completed by SLP: No Oral Cavity - Dentition: Adequate natural dentition Vision: Functional for self-feeding Self-Feeding Abilities: Able to feed self Patient Positioning: Upright in bed Baseline Vocal Quality: Normal Volitional Cough: Strong Volitional Swallow: Able to elicit    Oral/Motor/Sensory Function Overall Oral Motor/Sensory Function: Within functional limits   Ice Chips Ice chips: Not tested   Thin Liquid Thin Liquid: Within functional limits Presentation: Straw    Nectar Thick Nectar Thick Liquid: Not tested   Honey Thick Honey Thick Liquid: Not tested   Puree  Puree: Within functional limits Presentation: Spoon   Solid     Solid: Within functional limits Presentation: Self Fed      Kevin Heath, M.A., CF-SLP Speech Language Pathology, Acute Rehabilitation  Services  Secure Chat preferred [phone]  07/27/2023,9:36 AM

## 2023-07-27 NOTE — Progress Notes (Addendum)
Progress Note   Patient: Kevin Heath QQV:[id number] DOB: 1956-03-03 DOA: 07/26/2023     1 DOS: the patient was seen and examined on 07/27/2023   Brief hospital course: Kevin Heath was admitted to the hospital with the working diagnosis of heart failure exacerbation   67 yo male with the past medical history of heart failure, COPD, hypertension, hyperlipidemia, CKD, and OSA who presented with progressive dyspnea and chest pain. Reported 2 weeks of angina symptoms with exertional chest pain, improved with rest. Endorsed dietary indiscretions and worsening lower extremity edema.  On his initial physical examination his blood pressure was 113/74, HR 73, RR 32 and 02 saturation 96%, lungs with rales bilaterally and increased work of breathing, heart with S1 and S2 present and tachycardic, irregularly irregular, abdomen with no distention and positive lower extremity edema.   Na 140, K 3,2 Cl 104 bicarbonate 25, glucose 149, bun 15 cr 1,95  AST 43 and ALT 53  BNP 234  High sensitive troponin 20, 23 and 49  Wbc 10,7 hgb 13,9 plt 200  Sars covid 19 negative  Urine analysis SG 1,009, protein 30, negative leukocytes and negative Hgb.  Toxicology screen negative   Chest radiograph with left rotation with bilateral hilar vascular congestion, bilateral central interstitial infiltrates, with right base atelectasis.   EKG 171 bpm, normal axis, normal intervals, atrial fibrillation rhythm with poor R R wave progression with no significant ST segment or T wave changes.   Patient was placed on IV heparin for anticoagulation and IV diltiazem for rate control.  IV furosemide for diuresis.   Assessment and Plan: * Acute on chronic diastolic CHF (congestive heart failure) (HCC) 2011 echocardiogram with preserved LV systolic function with no significant valvular disease.  Plan to continue diuresis with IV furosemide, transitioned to drip to target negative fluid balance.  Continue metoprolol and  isosorbide.  Follow up new echocardiogram.   Atrial fibrillation with rapid ventricular response (HCC) Continue rate control with diltiazem drip. Anticoagulation with IV heparin. Continue telemetry monitoring.   Unstable angina Kevin Heath) Patient with angina symptoms, worsening in nature.  Plan to continue anticoagulation with heparin. Continue b blocker, aspirin and statin.  Plan for coronary angiography during this admission.    Acute kidney injury superimposed on chronic kidney disease (HCC) CKD stage 3 a. Hypokalemia.   Renal function with serum cr at 1,8 with K at 3,2 and serum bicarbonate at 19  Na 138, Mg 2.1   Plan to continue diuresis with furosemide.  K correction with Kcl 40 meq x2 Follow up renal function and electrolytes in am.   Obesity, class 2 Calculated BMI is 35.1 Pre diabetes continue insulin sliding scale to cover hyperglycemia..         Subjective: Patient with improvement in dyspnea but not back to baseline, positive abdominal wall edema   Physical Exam: Vitals:   07/27/23 0746 07/27/23 0752 07/27/23 1000 07/27/23 1142  BP: 113/75 113/75 111/68 123/76  Pulse:  (!) 54 61 69  Resp: (!) 22 (!) 24  19  Temp: 97.9 F (36.6 C) 97.9 F (36.6 C)  97.9 F (36.6 C)  TempSrc: Oral Oral  Oral  SpO2: 97% 95%  98%  Weight:      Height:       Neurology awake and alert ENT with mild pallor Cardiovascular with S1 and S2 present and regular with no gallops, rubs or murmurs Mild to moderate JVD No lower extremity edema, positive edema at the abdominal wall  Respiratory  with rales bilaterally with no wheezing.   Data Reviewed:    Family Communication: I spoke with patient's family at the bedside, we talked in detail about patient's condition, plan of care and prognosis and all questions were addressed.   Disposition: Status is: Inpatient Remains inpatient appropriate because: IV diuresis and pending cardiac catheterization   Planned Discharge  Destination: Home     Author: Coralie Keens, MD 07/27/2023 2:54 PM  For on call review [email or web address].

## 2023-07-27 NOTE — Progress Notes (Signed)
Progress Note  Patient Name: Kevin Heath Date of Encounter: 07/27/2023  Primary Cardiologist: None   Subjective   Patient seen and examined at his bedside, family at his bedside  Inpatient Medications    Scheduled Meds:  aspirin  81 mg Oral Daily   atorvastatin  40 mg Oral Daily   furosemide  80 mg Intravenous BID   insulin aspart  0-6 Units Subcutaneous TID WC   isosorbide mononitrate  30 mg Oral Daily   metoprolol tartrate  12.5 mg Oral Q6H   sodium chloride flush  3 mL Intravenous Q12H   Continuous Infusions:  diltiazem (CARDIZEM) infusion Stopped (07/27/23 0815)   heparin 1,200 Units/hr (07/27/23 0954)   PRN Meds: acetaminophen, ipratropium-albuterol, nitroGLYCERIN   Vital Signs    Vitals:   07/27/23 0746 07/27/23 0752 07/27/23 1000 07/27/23 1142  BP: 113/75 113/75 111/68 123/76  Pulse:  (!) 54 61 69  Resp: (!) 22 (!) 24  19  Temp: 97.9 F (36.6 C) 97.9 F (36.6 C)  97.9 F (36.6 C)  TempSrc: Oral Oral  Oral  SpO2: 97% 95%  98%  Weight:      Height:        Intake/Output Summary (Last 24 hours) at 07/27/2023 1203 Last data filed at 07/27/2023 0954 Gross per 24 hour  Intake 235.88 ml  Output 900 ml  Net -664.12 ml   Filed Weights   07/26/23 1819 07/26/23 2140 07/27/23 0500  Weight: 98.9 kg 98.9 kg 98.7 kg    Telemetry    Sinus rhythm  - Personally Reviewed  ECG    None today  - Personally Reviewed  Physical Exam    General: Comfortable Head: Atraumatic, normal size  Eyes: PEERLA, EOMI  Neck: Supple, normal JVD Cardiac: Normal S1, S2; RRR; no murmurs, rubs, or gallops Lungs: Clear to auscultation bilaterally Abd: Soft, nontender, no hepatomegaly  Ext: warm, no edema Musculoskeletal: No deformities, BUE and BLE strength normal and equal Skin: Warm and dry, no rashes   Neuro: Alert and oriented to person, place, time, and situation, CNII-XII grossly intact, no focal deficits  Psych: Normal mood and affect   Labs     Chemistry Recent Labs  Lab 07/26/23 1825 07/26/23 1832 07/27/23 0206  NA 140 142  142 138  K 3.2* 3.2*  3.2* 3.2*  CL 104 106 102  CO2 25  --  19*  GLUCOSE 149* 147* 281*  BUN 15 17 17   CREATININE 1.95* 1.50* 1.84*  CALCIUM 9.5  --  9.5  PROT 6.6  --   --   ALBUMIN 3.7  --   --   AST 43*  --   --   ALT 53*  --   --   ALKPHOS 84  --   --   BILITOT 0.7  --   --   GFRNONAA 37*  --  40*  ANIONGAP 11  --  17*     Hematology Recent Labs  Lab 07/26/23 1825 07/26/23 1832 07/27/23 0206  WBC 10.7*  --  9.3  RBC 4.90  --  4.71  HGB 13.9 15.0  15.0 13.3  HCT 44.6 44.0  44.0 42.3  MCV 91.0  --  89.8  MCH 28.4  --  28.2  MCHC 31.2  --  31.4  RDW 12.8  --  12.8  PLT 200  --  210    Cardiac EnzymesNo results for input(s): "TROPONINI" in the last 168 hours. No results for input(s): "TROPIPOC" in  the last 168 hours.   BNP Recent Labs  Lab 07/26/23 1825  BNP 234.4*     DDimer No results for input(s): "DDIMER" in the last 168 hours.   Radiology    DG Abd 1 View  Result Date: 07/26/2023 CLINICAL DATA:  Abdominal pain EXAM: ABDOMEN - 1 VIEW COMPARISON:  None Available. FINDINGS: No dilated loops of bowel to suggest obstruction. No radio-opaque calculi or other significant radiographic abnormality are seen. IMPRESSION: Negative. Electronically Signed   By: Minerva Fester M.D.   On: 07/26/2023 22:35   DG Chest Portable 1 View  Result Date: 07/26/2023 CLINICAL DATA:  Shortness of breath EXAM: PORTABLE CHEST 1 VIEW COMPARISON:  Chest x-ray 01/11/2023 FINDINGS: The heart is enlarged. There central pulmonary vascular congestion and some streaky perihilar opacities. There is no pleural effusion or pneumothorax. No acute fractures are seen. IMPRESSION: Cardiomegaly with central pulmonary vascular congestion and streaky perihilar opacities, likely pulmonary edema. Electronically Signed   By: Darliss Cheney M.D.   On: 07/26/2023 19:20    Cardiac Studies   Echo pending    Patient Profile     67 y.o. male with hx of heart failure with improved ejection fraction, new onset atrial fibrillation and unstable angina.   Assessment & Plan    Acute on Chronic HFimpEF NICM  Unstable angina  New onset atrial fibrillation   Clinically he still appears to be volume overloaded which is starting the patient on Lasix 80 mg IV twice daily.  Creatinine elevated which I suspect is prerenal azotemia from his heart failure.  With his significant volume/currently anasarca I would like to stop the IV diuretics and start the patient on Lasix drip for the next 24 hours.  He certainly does need ischemic evaluation, but first we need to diurese him as of now clinically he cannot lay flat to be able to tolerate the procedure.  I am hoping that we can get him in the Cath Lab likely Tuesday and no later than Wednesday for his ischemic evaluation.  Informed Consent   Shared Decision Making/Informed Consent The risks [stroke (1 in 1000), death (1 in 1000), kidney failure [usually temporary] (1 in 500), bleeding (1 in 200), allergic reaction [possibly serious] (1 in 200)], benefits (diagnostic support and management of coronary artery disease) and alternatives of a cardiac catheterization were discussed in detail with [name] and he is willing to proceed.      He is back in sinus rhythm.  Continue his anticoagulation with heparin drip given the fact that we are planning his heart catheterization in the next couple days.  His echocardiogram is pending, based on his ejection fraction will discuss other possibilities for including his medication regimen.  Hemoglobin A1c he has prediabetes.  Plan discussed with the patient at his bedside.  No specific questions at this time.      For questions or updates, please contact [facility] Please consult [email or web address] for contact info under Cardiology/STEMI.      Osvaldo Shipper, DO  07/27/2023, 12:03 PM

## 2023-07-28 ENCOUNTER — Inpatient Hospital Stay (HOSPITAL_COMMUNITY): Payer: No Typology Code available for payment source

## 2023-07-28 DIAGNOSIS — I200 Unstable angina: Secondary | ICD-10-CM | POA: Diagnosis not present

## 2023-07-28 DIAGNOSIS — I5033 Acute on chronic diastolic (congestive) heart failure: Secondary | ICD-10-CM

## 2023-07-28 DIAGNOSIS — I509 Heart failure, unspecified: Secondary | ICD-10-CM | POA: Diagnosis not present

## 2023-07-28 DIAGNOSIS — I4891 Unspecified atrial fibrillation: Secondary | ICD-10-CM | POA: Diagnosis not present

## 2023-07-28 DIAGNOSIS — N179 Acute kidney failure, unspecified: Secondary | ICD-10-CM | POA: Diagnosis not present

## 2023-07-28 LAB — BASIC METABOLIC PANEL
Anion gap: 5 (ref 5–15)
BUN: 20 mg/dL (ref 8–23)
CO2: 26 mmol/L (ref 22–32)
Calcium: 9.4 mg/dL (ref 8.9–10.3)
Chloride: 108 mmol/L (ref 98–111)
Creatinine, Ser: 1.66 mg/dL — ABNORMAL HIGH (ref 0.61–1.24)
GFR, Estimated: 45 mL/min — ABNORMAL LOW (ref >60)
Glucose, Bld: 135 mg/dL — ABNORMAL HIGH (ref 70–99)
Potassium: 3.5 mmol/L (ref 3.5–5.1)
Sodium: 139 mmol/L (ref 135–145)

## 2023-07-28 LAB — ECHOCARDIOGRAM COMPLETE
AR max vel: 2.19 cm2
AV Area VTI: 2.01 cm2
AV Area mean vel: 1.72 cm2
AV Mean grad: 2 mm[Hg]
AV Peak grad: 3.7 mm[Hg]
Ao pk vel: 0.96 m/s
Area-P 1/2: 5.75 cm2
Est EF: 30
Height: 66 in
MV VTI: 1.34 cm2
S' Lateral: 4.4 cm
Single Plane A4C EF: 36.9 %
Weight: 3446.23 [oz_av]

## 2023-07-28 LAB — GLUCOSE, CAPILLARY
Glucose-Capillary: 117 mg/dL — ABNORMAL HIGH (ref 70–99)
Glucose-Capillary: 92 mg/dL (ref 70–99)
Glucose-Capillary: 98 mg/dL (ref 70–99)
Glucose-Capillary: 150 mg/dL — ABNORMAL HIGH (ref 70–99)

## 2023-07-28 LAB — MAGNESIUM: Magnesium: 2.1 mg/dL (ref 1.7–2.4)

## 2023-07-28 LAB — HEPARIN LEVEL (UNFRACTIONATED): Heparin Unfractionated: 0.48 [IU]/mL (ref 0.30–0.70)

## 2023-07-28 MED ORDER — SODIUM CHLORIDE 0.9% FLUSH: 10.0000 mL | Freq: Two times a day (BID) | INTRAVENOUS | Status: DC

## 2023-07-28 MED ORDER — METOPROLOL TARTRATE 12.5 MG HALF TABLET
12.5000 mg | ORAL_TABLET | Freq: Two times a day (BID) | ORAL | Status: DC
  Administered 2023-07-28 – 2023-07-29 (×2): 12.5 mg via ORAL
  Filled 2023-07-28 (×2): qty 1

## 2023-07-28 MED ORDER — ASPIRIN 81 MG PO CHEW
81.0000 mg | CHEWABLE_TABLET | Freq: Every day | ORAL | Status: DC
  Administered 2023-07-30: 81 mg via ORAL
  Filled 2023-07-28: qty 1

## 2023-07-28 MED ORDER — ASPIRIN 81 MG PO CHEW
81.0000 mg | CHEWABLE_TABLET | ORAL | Status: AC
  Administered 2023-07-29: 81 mg via ORAL
  Filled 2023-07-28: qty 1

## 2023-07-28 MED ORDER — POTASSIUM CHLORIDE CRYS ER 20 MEQ PO TBCR
40.0000 meq | EXTENDED_RELEASE_TABLET | Freq: Once | ORAL | Status: AC
  Administered 2023-07-28: 40 meq via ORAL
  Filled 2023-07-28: qty 2

## 2023-07-28 MED ORDER — PERFLUTREN LIPID MICROSPHERE
1.0000 mL | INTRAVENOUS | Status: AC | PRN
Start: 2023-07-28 — End: 2023-07-28
  Administered 2023-07-28: 2 mL via INTRAVENOUS

## 2023-07-28 NOTE — Progress Notes (Signed)
Progress Note   Patient: Kevin Heath ZOX:[id number] DOB: [birth year] DOA: 07/26/2023     2 DOS: the patient was seen and examined on 07/28/2023   Brief hospital course: Kevin Heath was admitted to the hospital with the working diagnosis of heart failure exacerbation   67 yo male with the past medical history of heart failure, COPD, hypertension, hyperlipidemia, CKD, and OSA who presented with progressive dyspnea and chest pain. Reported 2 weeks of angina symptoms with exertional chest pain, improved with rest. Endorsed dietary indiscretions and worsening lower extremity edema.  On his initial physical examination his blood pressure was 113/74, HR 73, RR 32 and 02 saturation 96%, lungs with rales bilaterally and increased work of breathing, heart with S1 and S2 present and tachycardic, irregularly irregular, abdomen with no distention and positive lower extremity edema.   Na 140, K 3,2 Cl 104 bicarbonate 25, glucose 149, bun 15 cr 1,95  AST 43 and ALT 53  BNP 234  High sensitive troponin 20, 23 and 49  Wbc 10,7 hgb 13,9 plt 200  Sars covid 19 negative  Urine analysis SG 1,009, protein 30, negative leukocytes and negative Hgb.  Toxicology screen negative   Chest radiograph with left rotation with bilateral hilar vascular congestion, bilateral central interstitial infiltrates, with right base atelectasis.   EKG 171 bpm, normal axis, normal intervals, atrial fibrillation rhythm with poor R R wave progression with no significant ST segment or T wave changes.   Patient was placed on IV heparin for anticoagulation and IV diltiazem for rate control.  IV furosemide for diuresis.   11/25 improvement in volume status, plan for coronary angiography tomorrow.   Assessment and Plan: * Acute on chronic diastolic CHF (congestive heart failure) (HCC) Echocardiogram with reduced LV systolic function with EF 30%, global hypokinesis, RV systolic function preserved, LA with mild dilatation, no  pericardial effusion,   Urine output is 2,075 ml Systolic blood pressure 129 to 136 mmHg.   Plan to continue diuresis with IV furosemide, continue with drip 8 mg/hr   Continue metoprolol and isosorbide.   Atrial fibrillation with rapid ventricular response (HCC) Diltiazem drip has been held due to bradycardia, patient has converted to sinus rhythm, with rate in 60's.    Plan to continue telemetry monitoring.  Considering lowe LV systolic function will avoid calcium channel blocker.  Will reduce metoprolol to 12.5 mg po bid.   Anticoagulation with IV heparin.  Unstable angina [facility]) Patient with angina symptoms, worsening in nature.  Plan to continue anticoagulation with heparin. Continue isosorbide, beya blocker, aspirin and statin.  Plan for coronary angiography during this tomorrow.   Acute kidney injury superimposed on chronic kidney disease (HCC) CKD stage 3 a. Hypokalemia.   Improved volume status, renal function today with serum cr at 1,6 with K at 3,5 and serum bicarbonate at 26,  Na 139 Mg 2,1   Plan to continue diuresis with furosemide.  K correction with Kcl 40 meq x1 Follow up renal function and electrolytes in am.   Obesity, class 2 Calculated BMI is 35.1 Pre diabetes continue insulin sliding scale to cover hyperglycemia..         Subjective: Patient with improvement in dyspnea and edema, no chest pain today. He has been resting in bed.   Physical Exam: Vitals:   07/28/23 0814 07/28/23 1130 07/28/23 1430 07/28/23 1500  BP:  (!) 133/93    Pulse:  66 71   Resp:  18 (!) 22   Temp:  98.5 F (  36.9 C)    TempSrc: Oral Oral    SpO2:  98% 96% 95%  Weight:      Height:       Neurology awake and alert ENT with mild pallor Cardiovascular with S1 and S2 present and regular with no gallops, rubs or murmurs Mild JVD No lower extremity edema Respiratory with mild rales at bases with no wheezing Abdomen with no distention  Data Reviewed:    Family  Communication: no family at the bedside   Disposition: Status is: Inpatient Remains inpatient appropriate because: heart failure, pending cardiac catheterization   Planned Discharge Destination: Home    Author: Coralie Keens, MD 07/28/2023 3:41 PM  For on call review [email or web address].

## 2023-07-28 NOTE — Progress Notes (Signed)
   07/28/23 0019  BiPAP/CPAP/SIPAP  $ Non-Invasive Ventilator  Non-Invasive Vent Subsequent  $ Face Mask Medium Yes  BiPAP/CPAP/SIPAP Pt Type Adult  BiPAP/CPAP/SIPAP SERVO  Mask Type Full face mask  Mask Size Medium  Respiratory Rate 22 breaths/min  IPAP 13 cmH20  EPAP 5 cmH2O  Pressure Support 8 cmH20  PEEP 5 cmH20  FiO2 (%) 40 %  Minute Ventilation 14.1  Leak 16  Tidal Volume (Vt) 521  Patient Home Equipment No  Press High Alarm 20 cmH2O  CPAP/SIPAP surface wiped down Yes

## 2023-07-28 NOTE — TOC Initial Note (Signed)
Transition of Care [facility]) - Initial/Assessment Note    Patient Details  Name: Kevin Heath MRN: [id number] Date of Birth: [birth year]  [facility] CM/SW Contact:    Leone Haven, RN Phone Number: 07/28/2023, 6:33 PM  Clinical Narrative:                 From home with spouse, has PCP and insurance on file, states has no HH services in place at this time , has cpap machine at home.  States wife  will transport him home at [employer] and wife is support system, states gets medications from [place] and for any new medications will need to go to [facility] on [address]    Pta self ambulatory. Per wife the [place] knows patient is here at the hospital , they sent he here.    Expected Discharge Plan: Home/Self Care Barriers to Discharge: Continued Medical Work up   Patient Goals and CMS Choice Patient states their goals for this hospitalization and ongoing recovery are:: return home   Choice offered to / list presented to : NA      Expected Discharge Plan and Services In-house Referral: NA Discharge Planning Services: CM Consult Post Acute Care Choice: NA Living arrangements for the past 2 months: Single Family Home                 DME Arranged: N/A DME Agency: NA       HH Arranged: NA          Prior Living Arrangements/Services Living arrangements for the past 2 months: Single Family Home Lives with:: Spouse Patient language and need for interpreter reviewed:: Yes Do you feel safe going back to the place where you live?: Yes      Need for Family Participation in Patient Care: Yes (Comment) Care giver support system in place?: Yes (comment) Current home services: DME (cpap) Criminal Activity/Legal Involvement Pertinent to Current Situation/Hospitalization: No - Comment as needed  Activities of Daily Living   ADL Screening (condition at time of admission) Independently performs ADLs?: Yes (appropriate for developmental age) Is the patient deaf or have  difficulty hearing?: No Does the patient have difficulty seeing, even when wearing glasses/contacts?: No Does the patient have difficulty concentrating, remembering, or making decisions?: No  Permission Sought/Granted Permission sought to share information with : Case Manager Permission granted to share information with : Yes, Verbal Permission Granted              Emotional Assessment Appearance:: Appears stated age Attitude/Demeanor/Rapport: Engaged Affect (typically observed): Appropriate Orientation: : Oriented to Self, Oriented to Place, Oriented to  Time, Oriented to Situation Alcohol / Substance Use: Not Applicable Psych Involvement: No (comment)  Admission diagnosis:  COPD exacerbation (HCC) [J44.1] Acute exacerbation of CHF (congestive heart failure) (HCC) [I50.9] Patient Active Problem List   Diagnosis Date Noted   Chest pain 07/27/2023   Atrial fibrillation with rapid ventricular response (HCC) 07/27/2023   Respiratory distress 07/27/2023   Acute kidney injury superimposed on chronic kidney disease (HCC) 07/27/2023   Unstable angina (HCC) 07/27/2023   Obesity, class 2 07/27/2023   Acute on chronic diastolic CHF (congestive heart failure) (HCC) 07/26/2023   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 [place] [address] Phone: [phone] Fax: [phone]  Social Determinants of Health (SDOH) Social History: SDOH Screenings   Food Insecurity: No Food Insecurity (07/26/2023)  Housing: Low Risk  (07/26/2023)  Transportation Needs: No Transportation Needs (07/26/2023)  Utilities: Not At Risk (07/26/2023)  Social Connections: Unknown (04/29/2023)   Received from [facility]   SDOH Interventions:     Readmission Risk Interventions      No data to display

## 2023-07-28 NOTE — Plan of Care (Signed)
Problem: Education: Goal: Knowledge of General Education information will improve Description: Including pain rating scale, medication(s)/side effects and non-pharmacologic comfort measures Outcome: Progressing   Problem: Health Behavior/Discharge Planning: Goal: Ability to manage health-related needs will improve Outcome: Progressing   Problem: Clinical Measurements: Goal: Ability to maintain clinical measurements within normal limits will improve Outcome: Progressing Goal: Will remain free from infection Outcome: Progressing Goal: Diagnostic test results will improve Outcome: Progressing Goal: Respiratory complications will improve Outcome: Progressing Goal: Cardiovascular complication will be avoided Outcome: Progressing   Problem: Nutrition: Goal: Adequate nutrition will be maintained Outcome: Progressing   Problem: Activity: Goal: Risk for activity intolerance will decrease Outcome: Progressing

## 2023-07-28 NOTE — Progress Notes (Signed)
Progress Note  Patient Name: Kevin Heath Date of Encounter: 07/28/2023  Primary Cardiologist: None   Subjective   Patient seen examined his bedside.  He is awake when I arrived.  He tells me breathing has gotten better.  The tightness that he had in his abdomen has relieved significantly over the last 24 hours.  He reports increased urination overnight.  Inpatient Medications    Scheduled Meds:  aspirin  81 mg Oral Daily   atorvastatin  40 mg Oral Daily   insulin aspart  0-6 Units Subcutaneous TID WC   isosorbide mononitrate  30 mg Oral Daily   metoprolol tartrate  12.5 mg Oral Q6H   sodium chloride flush  3 mL Intravenous Q12H   Continuous Infusions:  diltiazem (CARDIZEM) infusion Stopped (07/27/23 0815)   furosemide (LASIX) 200 mg in dextrose 5 % 100 mL (2 mg/mL) infusion 8 mg/hr (07/28/23 0800)   heparin 1,200 Units/hr (07/28/23 0800)   PRN Meds: acetaminophen, ipratropium-albuterol, nitroGLYCERIN   Vital Signs    Vitals:   07/27/23 2328 07/28/23 0019 07/28/23 0406 07/28/23 0814  BP:   (!) 132/99   Pulse:  70 62   Resp: 18 19 19    Temp:   (!) 97.4 F (36.3 C)   TempSrc:   Axillary Oral  SpO2:  100% 99%   Weight:   97.7 kg   Height:        Intake/Output Summary (Last 24 hours) at 07/28/2023 0954 Last data filed at 07/28/2023 0800 Gross per 24 hour  Intake 904.96 ml  Output 1701 ml  Net -796.04 ml   Filed Weights   07/26/23 2140 07/27/23 0500 07/28/23 0406  Weight: 98.9 kg 98.7 kg 97.7 kg    Telemetry    Sinus rhythm- Personally Reviewed  ECG     - Personally Reviewed  Physical Exam     General: Comfortable, Head: Atraumatic, normal size  Eyes: PEERLA, EOMI  Neck: Supple, normal JVD Cardiac: Normal S1, S2; RRR; no murmurs, rubs, or gallops Lungs: Clear to auscultation bilaterally Abd: Soft, nontender, no hepatomegaly  Ext: warm, no edema Musculoskeletal: No deformities, BUE and BLE strength normal and equal Skin: Warm and dry, no  rashes   Neuro: Alert and oriented to person, place, time, and situation, CNII-XII grossly intact, no focal deficits  Psych: Normal mood and affect   Labs    Chemistry Recent Labs  Lab 07/26/23 1825 07/26/23 1832 07/27/23 0206 07/28/23 0325  NA 140 142  142 138 139  K 3.2* 3.2*  3.2* 3.2* 3.5  CL 104 106 102 108  CO2 25  --  19* 26  GLUCOSE 149* 147* 281* 135*  BUN 15 17 17 20   CREATININE 1.95* 1.50* 1.84* 1.66*  CALCIUM 9.5  --  9.5 9.4  PROT 6.6  --   --   --   ALBUMIN 3.7  --   --   --   AST 43*  --   --   --   ALT 53*  --   --   --   ALKPHOS 84  --   --   --   BILITOT 0.7  --   --   --   GFRNONAA 37*  --  40* 45*  ANIONGAP 11  --  17* 5     Hematology Recent Labs  Lab 07/26/23 1825 07/26/23 1832 07/27/23 0206  WBC 10.7*  --  9.3  RBC 4.90  --  4.71  HGB 13.9 15.0  15.0 13.3  HCT 44.6 44.0  44.0 42.3  MCV 91.0  --  89.8  MCH 28.4  --  28.2  MCHC 31.2  --  31.4  RDW 12.8  --  12.8  PLT 200  --  210    Cardiac EnzymesNo results for input(s): "TROPONINI" in the last 168 hours. No results for input(s): "TROPIPOC" in the last 168 hours.   BNP Recent Labs  Lab 07/26/23 1825  BNP 234.4*     DDimer No results for input(s): "DDIMER" in the last 168 hours.   Radiology    DG Abd 1 View  Result Date: 07/26/2023 CLINICAL DATA:  Abdominal pain EXAM: ABDOMEN - 1 VIEW COMPARISON:  None Available. FINDINGS: No dilated loops of bowel to suggest obstruction. No radio-opaque calculi or other significant radiographic abnormality are seen. IMPRESSION: Negative. Electronically Signed   By: Minerva Fester M.D.   On: 07/26/2023 22:35   DG Chest Portable 1 View  Result Date: 07/26/2023 CLINICAL DATA:  Shortness of breath EXAM: PORTABLE CHEST 1 VIEW COMPARISON:  Chest x-ray 01/11/2023 FINDINGS: The heart is enlarged. There central pulmonary vascular congestion and some streaky perihilar opacities. There is no pleural effusion or pneumothorax. No acute fractures are  seen. IMPRESSION: Cardiomegaly with central pulmonary vascular congestion and streaky perihilar opacities, likely pulmonary edema. Electronically Signed   By: Darliss Cheney M.D.   On: 07/26/2023 19:20    Cardiac Studies   Echo pending  Patient Profile     67 y.o. 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  He also tells me that his abdominal girth wishes significantly tight has felt like it is loosening up his breathing is improving.  He was able to lie flat yesterday.  He is responding to the Lasix drip, because his kidney function has improved clinically he is improving I like to keep him on the drip for the next 24 hours.  I still think he needs cath, he responded with improved chest pain once started on a heparin drip.  We will place him on the Schedule for tomorrow. Informed Consent   Shared Decision Making/Informed Consent The risks [stroke (1 in 1000), death (1 in 1000), kidney failure [usually temporary] (1 in 500), bleeding (1 in 200), allergic reaction [possibly serious] (1 in 200)], benefits (diagnostic support and management of coronary artery disease) and alternatives of a cardiac catheterization were discussed in detail with Mr. Napoles and he is willing to proceed.      He is back in sinus rhythm, chadsvasc score is 30 (age greater than 15, hypertension, history of CHF)-post catheterization he will need to be transition to oral anticoagulant like Eliquis 5 mg twice daily.     For questions or updates, please contact [facility] Please consult [email or web address] for contact info under Cardiology/STEMI.      Osvaldo Shipper, DO  07/28/2023, 9:54 AM

## 2023-07-28 NOTE — Progress Notes (Addendum)
ANTICOAGULATION CONSULT NOTE  Pharmacy Consult for Heparin Indication: atrial fibrillation  Allergies  Allergen Reactions   Angiotensin Anaphylaxis   Ace Inhibitors Swelling   Lisinopril Swelling    Respiratory arrest   Sertraline Hcl     Lightheaded   Spironolactone     Engorgement of breasts    Patient Measurements: Height: 5\' 6"  (167.6 cm) Weight: 97.7 kg (215 lb 6.2 oz) IBW/kg (Calculated) : 63.8 Heparin Dosing Weight: 86 kg  Vital Signs: Temp: 97.4 F (36.3 C) (11/25 0406) Temp Source: Axillary (11/25 0406) BP: 132/99 (11/25 0406) Pulse Rate: 62 (11/25 0406)  Labs: Recent Labs    07/26/23 1825 07/26/23 1832 07/26/23 2030 07/27/23 0206 07/27/23 0810 07/27/23 1609 07/28/23 0325  HGB 13.9 15.0  15.0  --  13.3  --   --   --   HCT 44.6 44.0  44.0  --  42.3  --   --   --   PLT 200  --   --  210  --   --   --   LABPROT  --   --   --  15.1  --   --   --   INR  --   --   --  1.2  --   --   --   HEPARINUNFRC  --   --   --   --  0.69 0.56 0.48  CREATININE 1.95* 1.50*  --  1.84*  --   --  1.66*  TROPONINIHS 20*  --  23* 49*  --   --   --     Estimated Creatinine Clearance: 47.9 mL/min (A) (by C-G formula based on SCr of 1.66 mg/dL (H)).   Medical History: Past Medical History:  Diagnosis Date   Asthma    CHF (congestive heart failure) (HCC)    CKD (chronic kidney disease)    COPD (chronic obstructive pulmonary disease) (HCC)    HLD (hyperlipidemia)    HTN (hypertension)    MI (myocardial infarction) (HCC)    Sleep apnea    Assessment: 12 yom with a history of HTN, COPD, HF. Patient is presenting with SOB. Heparin per pharmacy consult placed for atrial fibrillation. Patient started on BiPAP and continuous albuterol on arrival to ED. While on BiPAP experienced AF w/ HR in the 170s. Patient is not on anticoagulation prior to arrival.  Heparin level is therapeutic at 0.48 on 1200 units/hr. CBC ok on last check 11/24 - Hgb 13.3, PLTc 210. No s/sx of bleeding  or infusion issues.   Goal of Therapy:  Heparin level 0.3-0.7 units/ml Monitor platelets by anticoagulation protocol: Yes   Plan:  Continue heparin infusion at 1200 units/hr Check heparin level with AM labs and daily while on heparin Continue to monitor H&H and platelets F/u timing of LHC   Thank you for allowing pharmacy to participate in this patient's care,  Sherron Monday, PharmD, BCCCP Clinical Pharmacist  Phone: [phone] 07/28/2023 7:28 AM  Please check AMION for all [facility] Pharmacy phone numbers After 10:00 PM, call Main Pharmacy [phone]

## 2023-07-29 ENCOUNTER — Other Ambulatory Visit: Payer: Self-pay

## 2023-07-29 ENCOUNTER — Encounter (HOSPITAL_COMMUNITY)
Admission: EM | Disposition: A | Discharge status: Home / Self Care | Payer: Self-pay | Source: Home / Self Care | Attending: Internal Medicine

## 2023-07-29 ENCOUNTER — Other Ambulatory Visit (HOSPITAL_COMMUNITY): Payer: Self-pay

## 2023-07-29 ENCOUNTER — Encounter (HOSPITAL_COMMUNITY): Payer: Self-pay | Admitting: Cardiology

## 2023-07-29 DIAGNOSIS — I2510 Atherosclerotic heart disease of native coronary artery without angina pectoris: Secondary | ICD-10-CM

## 2023-07-29 DIAGNOSIS — I5043 Acute on chronic combined systolic (congestive) and diastolic (congestive) heart failure: Secondary | ICD-10-CM | POA: Diagnosis not present

## 2023-07-29 DIAGNOSIS — I2583 Coronary atherosclerosis due to lipid rich plaque: Secondary | ICD-10-CM

## 2023-07-29 DIAGNOSIS — I420 Dilated cardiomyopathy: Secondary | ICD-10-CM | POA: Diagnosis not present

## 2023-07-29 DIAGNOSIS — I200 Unstable angina: Secondary | ICD-10-CM | POA: Diagnosis not present

## 2023-07-29 DIAGNOSIS — N179 Acute kidney failure, unspecified: Secondary | ICD-10-CM | POA: Diagnosis not present

## 2023-07-29 DIAGNOSIS — I4891 Unspecified atrial fibrillation: Secondary | ICD-10-CM | POA: Diagnosis not present

## 2023-07-29 DIAGNOSIS — I5033 Acute on chronic diastolic (congestive) heart failure: Secondary | ICD-10-CM | POA: Diagnosis not present

## 2023-07-29 HISTORY — PX: RIGHT/LEFT HEART CATH AND CORONARY ANGIOGRAPHY: CATH118266

## 2023-07-29 LAB — CBC
HCT: 41.3 % (ref 39.0–52.0)
HCT: 45.5 % (ref 39.0–52.0)
Hemoglobin: 13 g/dL (ref 13.0–17.0)
Hemoglobin: 14.3 g/dL (ref 13.0–17.0)
MCH: 28.8 pg (ref 26.0–34.0)
MCH: 28.5 pg (ref 26.0–34.0)
MCHC: 31.5 g/dL (ref 30.0–36.0)
MCHC: 31.4 g/dL (ref 30.0–36.0)
MCV: 91.4 fL (ref 80.0–100.0)
MCV: 90.6 fL (ref 80.0–100.0)
Platelets: 202 10*3/uL (ref 150–400)
Platelets: 212 10*3/uL (ref 150–400)
RBC: 4.52 MIL/uL (ref 4.22–5.81)
RBC: 5.02 MIL/uL (ref 4.22–5.81)
RDW: 13.2 % (ref 11.5–15.5)
RDW: 13.2 % (ref 11.5–15.5)
WBC: 12 10*3/uL — ABNORMAL HIGH (ref 4.0–10.5)
WBC: 9.5 10*3/uL (ref 4.0–10.5)
nRBC: 0 % (ref 0.0–0.2)
nRBC: 0 % (ref 0.0–0.2)

## 2023-07-29 LAB — POCT I-STAT 7, (LYTES, BLD GAS, ICA,H+H)
Acid-Base Excess: 5 mmol/L — ABNORMAL HIGH (ref 0.0–2.0)
Acid-Base Excess: 5 mmol/L — ABNORMAL HIGH (ref 0.0–2.0)
Acid-Base Excess: 6 mmol/L — ABNORMAL HIGH (ref 0.0–2.0)
Bicarbonate: 28.8 mmol/L — ABNORMAL HIGH (ref 20.0–28.0)
Bicarbonate: 31.3 mmol/L — ABNORMAL HIGH (ref 20.0–28.0)
Bicarbonate: 32 mmol/L — ABNORMAL HIGH (ref 20.0–28.0)
Calcium, Ion: 1.3 mmol/L (ref 1.15–1.40)
Calcium, Ion: 1.33 mmol/L (ref 1.15–1.40)
Calcium, Ion: 1.36 mmol/L (ref 1.15–1.40)
HCT: 42 % (ref 39.0–52.0)
HCT: 41 % (ref 39.0–52.0)
HCT: 42 % (ref 39.0–52.0)
Hemoglobin: 14.3 g/dL (ref 13.0–17.0)
Hemoglobin: 13.9 g/dL (ref 13.0–17.0)
Hemoglobin: 14.3 g/dL (ref 13.0–17.0)
O2 Saturation: 96 %
O2 Saturation: 49 %
O2 Saturation: 74 %
Potassium: 3.5 mmol/L (ref 3.5–5.1)
Potassium: 3.4 mmol/L — ABNORMAL LOW (ref 3.5–5.1)
Potassium: 3.5 mmol/L (ref 3.5–5.1)
Sodium: 143 mmol/L (ref 135–145)
Sodium: 139 mmol/L (ref 135–145)
Sodium: 144 mmol/L (ref 135–145)
TCO2: 30 mmol/L (ref 22–32)
TCO2: 33 mmol/L — ABNORMAL HIGH (ref 22–32)
TCO2: 34 mmol/L — ABNORMAL HIGH (ref 22–32)
pCO2 arterial: 37.8 mm[Hg] (ref 32–48)
pCO2 arterial: 50.4 mm[Hg] — ABNORMAL HIGH (ref 32–48)
pCO2 arterial: 53 mm[Hg] — ABNORMAL HIGH (ref 32–48)
pH, Arterial: 7.491 — ABNORMAL HIGH (ref 7.35–7.45)
pH, Arterial: 7.379 (ref 7.35–7.45)
pH, Arterial: 7.411 (ref 7.35–7.45)
pO2, Arterial: 73 mm[Hg] — ABNORMAL LOW (ref 83–108)
pO2, Arterial: 28 mm[Hg] — CL (ref 83–108)
pO2, Arterial: 40 mm[Hg] — CL (ref 83–108)

## 2023-07-29 LAB — POCT I-STAT EG7
Acid-Base Excess: 6 mmol/L — ABNORMAL HIGH (ref 0.0–2.0)
Acid-Base Excess: 6 mmol/L — ABNORMAL HIGH (ref 0.0–2.0)
Bicarbonate: 32.7 mmol/L — ABNORMAL HIGH (ref 20.0–28.0)
Bicarbonate: 32.3 mmol/L — ABNORMAL HIGH (ref 20.0–28.0)
Calcium, Ion: 1.36 mmol/L (ref 1.15–1.40)
Calcium, Ion: 1.34 mmol/L (ref 1.15–1.40)
HCT: 43 % (ref 39.0–52.0)
HCT: 42 % (ref 39.0–52.0)
Hemoglobin: 14.6 g/dL (ref 13.0–17.0)
Hemoglobin: 14.3 g/dL (ref 13.0–17.0)
O2 Saturation: 49 %
O2 Saturation: 57 %
Patient temperature: 11
Potassium: 3.6 mmol/L (ref 3.5–5.1)
Potassium: 3.5 mmol/L (ref 3.5–5.1)
Sodium: 144 mmol/L (ref 135–145)
Sodium: 139 mmol/L (ref 135–145)
TCO2: 34 mmol/L — ABNORMAL HIGH (ref 22–32)
TCO2: 34 mmol/L — ABNORMAL HIGH (ref 22–32)
pCO2, Ven: 52 mm[Hg] (ref 44–60)
pCO2, Ven: 16.9 mm[Hg] — CL (ref 44–60)
pH, Ven: 7.407 (ref 7.25–7.43)
pH, Ven: 7.779 (ref 7.25–7.43)
pO2, Ven: 27 mm[Hg] — CL (ref 32–45)
pO2, Ven: <15 mm[Hg] — CL (ref 32–45)

## 2023-07-29 LAB — MAGNESIUM: Magnesium: 1.9 mg/dL (ref 1.7–2.4)

## 2023-07-29 LAB — GLUCOSE, CAPILLARY
Glucose-Capillary: 122 mg/dL — ABNORMAL HIGH (ref 70–99)
Glucose-Capillary: 117 mg/dL — ABNORMAL HIGH (ref 70–99)
Glucose-Capillary: 160 mg/dL — ABNORMAL HIGH (ref 70–99)
Glucose-Capillary: 116 mg/dL — ABNORMAL HIGH (ref 70–99)

## 2023-07-29 LAB — BASIC METABOLIC PANEL
Anion gap: 8 (ref 5–15)
Anion gap: 9 (ref 5–15)
BUN: 19 mg/dL (ref 8–23)
BUN: 20 mg/dL (ref 8–23)
CO2: 28 mmol/L (ref 22–32)
CO2: 27 mmol/L (ref 22–32)
Calcium: 9.7 mg/dL (ref 8.9–10.3)
Calcium: 10 mg/dL (ref 8.9–10.3)
Chloride: 105 mmol/L (ref 98–111)
Chloride: 105 mmol/L (ref 98–111)
Creatinine, Ser: 1.54 mg/dL — ABNORMAL HIGH (ref 0.61–1.24)
Creatinine, Ser: 1.64 mg/dL — ABNORMAL HIGH (ref 0.61–1.24)
GFR, Estimated: 49 mL/min — ABNORMAL LOW (ref >60)
GFR, Estimated: 46 mL/min — ABNORMAL LOW (ref >60)
Glucose, Bld: 115 mg/dL — ABNORMAL HIGH (ref 70–99)
Glucose, Bld: 139 mg/dL — ABNORMAL HIGH (ref 70–99)
Potassium: 3.4 mmol/L — ABNORMAL LOW (ref 3.5–5.1)
Potassium: 3.5 mmol/L (ref 3.5–5.1)
Sodium: 141 mmol/L (ref 135–145)
Sodium: 141 mmol/L (ref 135–145)

## 2023-07-29 LAB — LACTIC ACID, PLASMA: Lactic Acid, Venous: 1.4 mmol/L (ref 0.5–1.9)

## 2023-07-29 LAB — HEPARIN LEVEL (UNFRACTIONATED): Heparin Unfractionated: 0.34 [IU]/mL (ref 0.30–0.70)

## 2023-07-29 SURGERY — RIGHT/LEFT HEART CATH AND CORONARY ANGIOGRAPHY
Anesthesia: LOCAL

## 2023-07-29 MED ORDER — FENTANYL CITRATE (PF) 100 MCG/2ML IJ SOLN
INTRAMUSCULAR | Status: AC
  Filled 2023-07-29: qty 2

## 2023-07-29 MED ORDER — SODIUM CHLORIDE 0.9% FLUSH
3.0000 mL | Freq: Two times a day (BID) | INTRAVENOUS | Status: DC
  Administered 2023-07-31 – 2023-08-02 (×4): 3 mL via INTRAVENOUS

## 2023-07-29 MED ORDER — FUROSEMIDE 10 MG/ML IJ SOLN
INTRAMUSCULAR | Status: AC
  Filled 2023-07-29: qty 4

## 2023-07-29 MED ORDER — LABETALOL HCL 5 MG/ML IV SOLN: 10.0000 mg | INTRAVENOUS | Status: DC | PRN

## 2023-07-29 MED ORDER — FUROSEMIDE 10 MG/ML IJ SOLN
15.0000 mg/h | INTRAVENOUS | Status: DC
  Administered 2023-07-29: 8 mg/h via INTRAVENOUS
  Administered 2023-07-30 – 2023-07-31 (×2): 15 mg/h via INTRAVENOUS
  Filled 2023-07-29 (×3): qty 20

## 2023-07-29 MED ORDER — FUROSEMIDE 10 MG/ML IJ SOLN
40.0000 mg | Freq: Two times a day (BID) | INTRAMUSCULAR | Status: DC
  Administered 2023-07-29: 40 mg via INTRAVENOUS
  Filled 2023-07-29: qty 4

## 2023-07-29 MED ORDER — FENTANYL CITRATE (PF) 100 MCG/2ML IJ SOLN
INTRAMUSCULAR | Status: DC | PRN
  Administered 2023-07-29: 25 ug via INTRAVENOUS

## 2023-07-29 MED ORDER — LIDOCAINE HCL (PF) 1 % IJ SOLN
INTRAMUSCULAR | Status: DC | PRN
  Administered 2023-07-29: 5 mL via INTRADERMAL
  Administered 2023-07-29: 2 mL via INTRADERMAL

## 2023-07-29 MED ORDER — SODIUM CHLORIDE 0.9% FLUSH: 3.0000 mL | INTRAVENOUS | Status: DC | PRN

## 2023-07-29 MED ORDER — AMIODARONE LOAD VIA INFUSION
125.0000 mg | Freq: Once | INTRAVENOUS | Status: DC
  Filled 2023-07-29: qty 69.45

## 2023-07-29 MED ORDER — HEPARIN SODIUM (PORCINE) 5000 UNIT/ML IJ SOLN: 5000.0000 [IU] | Freq: Three times a day (TID) | INTRAMUSCULAR | Status: DC

## 2023-07-29 MED ORDER — MIDAZOLAM HCL 2 MG/2ML IJ SOLN
INTRAMUSCULAR | Status: AC
Start: 2023-07-29 — End: ?
  Filled 2023-07-29: qty 2

## 2023-07-29 MED ORDER — APIXABAN 5 MG PO TABS
5.0000 mg | ORAL_TABLET | Freq: Two times a day (BID) | ORAL | Status: DC
  Administered 2023-07-29 – 2023-08-02 (×8): 5 mg via ORAL
  Filled 2023-07-29 (×8): qty 1

## 2023-07-29 MED ORDER — HEPARIN (PORCINE) IN NACL 1000-0.9 UT/500ML-% IV SOLN
INTRAVENOUS | Status: DC | PRN
  Administered 2023-07-29 (×2): 500 mL

## 2023-07-29 MED ORDER — POTASSIUM CHLORIDE CRYS ER 20 MEQ PO TBCR
60.0000 meq | EXTENDED_RELEASE_TABLET | Freq: Once | ORAL | Status: AC
  Administered 2023-07-29: 60 meq via ORAL
  Filled 2023-07-29: qty 3

## 2023-07-29 MED ORDER — LIDOCAINE HCL (PF) 1 % IJ SOLN
INTRAMUSCULAR | Status: AC
  Filled 2023-07-29: qty 30

## 2023-07-29 MED ORDER — AMIODARONE LOAD VIA INFUSION
150.0000 mg | Freq: Once | INTRAVENOUS | Status: AC
  Administered 2023-07-29: 150 mg via INTRAVENOUS
  Filled 2023-07-29: qty 83.34

## 2023-07-29 MED ORDER — HEPARIN SODIUM (PORCINE) 1000 UNIT/ML IJ SOLN
INTRAMUSCULAR | Status: DC | PRN
  Administered 2023-07-29: 5000 [IU] via INTRAVENOUS

## 2023-07-29 MED ORDER — SODIUM CHLORIDE 0.9 % IV SOLN: INTRAVENOUS | Status: AC

## 2023-07-29 MED ORDER — VERAPAMIL HCL 2.5 MG/ML IV SOLN
INTRAVENOUS | Status: AC
  Filled 2023-07-29: qty 2

## 2023-07-29 MED ORDER — AMIODARONE LOAD VIA INFUSION: 125.0000 mg | Freq: Once | INTRAVENOUS | Status: DC

## 2023-07-29 MED ORDER — HEPARIN (PORCINE) 25000 UT/250ML-% IV SOLN: 1200.0000 [IU]/h | INTRAVENOUS | Status: DC

## 2023-07-29 MED ORDER — VERAPAMIL HCL 2.5 MG/ML IV SOLN
INTRAVENOUS | Status: DC | PRN
  Administered 2023-07-29: 10 mL via INTRA_ARTERIAL

## 2023-07-29 MED ORDER — AMIODARONE HCL IN DEXTROSE 360-4.14 MG/200ML-% IV SOLN
30.0000 mg/h | INTRAVENOUS | Status: DC
  Administered 2023-07-29 – 2023-07-31 (×4): 30 mg/h via INTRAVENOUS
  Filled 2023-07-29 (×4): qty 200

## 2023-07-29 MED ORDER — AMIODARONE HCL IN DEXTROSE 360-4.14 MG/200ML-% IV SOLN: 30.0000 mg/h | INTRAVENOUS | Status: DC

## 2023-07-29 MED ORDER — SODIUM CHLORIDE 0.9 % IV SOLN: 250.0000 mL | INTRAVENOUS | Status: AC | PRN

## 2023-07-29 MED ORDER — MIDAZOLAM HCL 2 MG/2ML IJ SOLN
INTRAMUSCULAR | Status: DC | PRN
  Administered 2023-07-29: 1 mg via INTRAVENOUS

## 2023-07-29 MED ORDER — HEPARIN SODIUM (PORCINE) 1000 UNIT/ML IJ SOLN
INTRAMUSCULAR | Status: AC
  Filled 2023-07-29: qty 10

## 2023-07-29 MED ORDER — POTASSIUM CHLORIDE CRYS ER 20 MEQ PO TBCR
40.0000 meq | EXTENDED_RELEASE_TABLET | Freq: Once | ORAL | Status: AC
  Administered 2023-07-29: 40 meq via ORAL
  Filled 2023-07-29: qty 2

## 2023-07-29 MED ORDER — HYDRALAZINE HCL 10 MG PO TABS
10.0000 mg | ORAL_TABLET | Freq: Three times a day (TID) | ORAL | Status: DC
  Administered 2023-07-29 – 2023-07-30 (×2): 10 mg via ORAL
  Filled 2023-07-29 (×2): qty 1

## 2023-07-29 MED ORDER — MILRINONE LACTATE IN DEXTROSE 20-5 MG/100ML-% IV SOLN
0.1250 ug/kg/min | INTRAVENOUS | Status: DC
Start: 2023-07-29 — End: 2023-08-01
  Administered 2023-07-29: 0.125 ug/kg/min via INTRAVENOUS
  Administered 2023-07-30 – 2023-07-31 (×3): 0.25 ug/kg/min via INTRAVENOUS
  Administered 2023-08-01: 0.125 ug/kg/min via INTRAVENOUS
  Filled 2023-07-29 (×5): qty 100

## 2023-07-29 MED ORDER — MAGNESIUM SULFATE 2 GM/50ML IV SOLN
2.0000 g | Freq: Once | INTRAVENOUS | Status: AC
  Administered 2023-07-29: 2 g via INTRAVENOUS
  Filled 2023-07-29: qty 50

## 2023-07-29 MED ORDER — HYDRALAZINE HCL 20 MG/ML IJ SOLN: 10.0000 mg | INTRAMUSCULAR | Status: AC | PRN

## 2023-07-29 MED ORDER — IOHEXOL 350 MG/ML SOLN
INTRAVENOUS | Status: DC | PRN
  Administered 2023-07-29: 62 mL

## 2023-07-29 MED ORDER — FUROSEMIDE 10 MG/ML IJ SOLN
INTRAMUSCULAR | Status: DC | PRN
  Administered 2023-07-29: 40 mg via INTRAVENOUS

## 2023-07-29 SURGICAL SUPPLY — 12 items
CATH BALLN WEDGE 5F 110CM (CATHETERS) IMPLANT
CATH INFINITI AMBI 5FR TG (CATHETERS) IMPLANT
CATH INFINITI JR4 5F (CATHETERS) IMPLANT
DEVICE RAD COMP TR BAND LRG (VASCULAR PRODUCTS) IMPLANT
GLIDESHEATH SLEND SS 6F .021 (SHEATH) IMPLANT
GUIDEWIRE INQWIRE 1.5J.035X260 (WIRE) IMPLANT
INQWIRE 1.5J .035X260CM (WIRE) ×1
PACK CARDIAC CATHETERIZATION (CUSTOM PROCEDURE TRAY) ×1 IMPLANT
SET ATX-X65L (MISCELLANEOUS) IMPLANT
SHEATH GLIDE SLENDER 4/5FR (SHEATH) IMPLANT
SHEATH PROBE COVER 6X72 (BAG) IMPLANT
WIRE EMERALD 3MM-J .025X260CM (WIRE) IMPLANT

## 2023-07-29 NOTE — Plan of Care (Signed)
  Problem: Education: Goal: Knowledge of General Education information will improve Description: Including pain rating scale, medication(s)/side effects and non-pharmacologic comfort measures Outcome: Progressing   Problem: Health Behavior/Discharge Planning: Goal: Ability to manage health-related needs will improve Outcome: Progressing   Problem: Clinical Measurements: Goal: Ability to maintain clinical measurements within normal limits will improve Outcome: Progressing Goal: Will remain free from infection Outcome: Progressing Goal: Diagnostic test results will improve Outcome: Progressing Goal: Respiratory complications will improve Outcome: Progressing Goal: Cardiovascular complication will be avoided Outcome: Progressing   Problem: Activity: Goal: Risk for activity intolerance will decrease Outcome: Progressing   Problem: Nutrition: Goal: Adequate nutrition will be maintained Outcome: Progressing   Problem: Coping: Goal: Level of anxiety will decrease Outcome: Progressing   Problem: Elimination: Goal: Will not experience complications related to bowel motility Outcome: Progressing Goal: Will not experience complications related to urinary retention Outcome: Progressing   Problem: Pain Management: Goal: General experience of comfort will improve Outcome: Progressing   Problem: Safety: Goal: Ability to remain free from injury will improve Outcome: Progressing   Problem: Skin Integrity: Goal: Risk for impaired skin integrity will decrease Outcome: Progressing   Problem: Education: Goal: Ability to describe self-care measures that may prevent or decrease complications (Diabetes Survival Skills Education) will improve Outcome: Progressing Goal: Individualized Educational Video(s) Outcome: Progressing   Problem: Coping: Goal: Ability to adjust to condition or change in health will improve Outcome: Progressing   Problem: Fluid Volume: Goal: Ability to  maintain a balanced intake and output will improve Outcome: Progressing   Problem: Health Behavior/Discharge Planning: Goal: Ability to identify and utilize available resources and services will improve Outcome: Progressing Goal: Ability to manage health-related needs will improve Outcome: Progressing   Problem: Metabolic: Goal: Ability to maintain appropriate glucose levels will improve Outcome: Progressing   Problem: Nutritional: Goal: Maintenance of adequate nutrition will improve Outcome: Progressing Goal: Progress toward achieving an optimal weight will improve Outcome: Progressing   Problem: Skin Integrity: Goal: Risk for impaired skin integrity will decrease Outcome: Progressing   Problem: Tissue Perfusion: Goal: Adequacy of tissue perfusion will improve Outcome: Progressing   Problem: Education: Goal: Understanding of CV disease, CV risk reduction, and recovery process will improve Outcome: Progressing Goal: Individualized Educational Video(s) Outcome: Progressing   Problem: Activity: Goal: Ability to return to baseline activity level will improve Outcome: Progressing   Problem: Cardiovascular: Goal: Ability to achieve and maintain adequate cardiovascular perfusion will improve Outcome: Progressing Goal: Vascular access site(s) Level 0-1 will be maintained Outcome: Progressing   Problem: Health Behavior/Discharge Planning: Goal: Ability to safely manage health-related needs after discharge will improve Outcome: Progressing

## 2023-07-29 NOTE — Interval H&P Note (Signed)
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  The various methods of treatment have been discussed with the patient and family. After consideration of risks, benefits and other options for treatment, the patient has consented to  Procedure(s): RIGHT/LEFT HEART CATH AND CORONARY ANGIOGRAPHY (N/A)  PERCUTANEOUS CORONARY INTERVENTION  as a surgical intervention.  The patient's history has been reviewed, patient examined, no change in status, stable for surgery.  I have reviewed the patient's chart and labs.  Questions were answered to the patient's satisfaction.    Cath Lab Visit (complete for each Cath Lab visit)  Clinical Evaluation Leading to the Procedure:   ACS: Yes.    Non-ACS:    Anginal Classification: CCS III  Anti-ischemic medical therapy: Maximal Therapy (2 or more classes of medications)  Non-Invasive Test Results: Equivocal test results severely reduced EF, but troponin is minimally elevated  Prior CABG: No previous CABG   Bryan Lemma

## 2023-07-29 NOTE — Plan of Care (Signed)
Problem: Education: Goal: Knowledge of General Education information will improve Description: Including pain rating scale, medication(s)/side effects and non-pharmacologic comfort measures Outcome: Progressing   Problem: Health Behavior/Discharge Planning: Goal: Ability to manage health-related needs will improve Outcome: Progressing   Problem: Clinical Measurements: Goal: Will remain free from infection Outcome: Progressing   Problem: Activity: Goal: Risk for activity intolerance will decrease Outcome: Progressing   Problem: Nutrition: Goal: Adequate nutrition will be maintained Outcome: Progressing   Problem: Coping: Goal: Level of anxiety will decrease Outcome: Progressing   Problem: Elimination: Goal: Will not experience complications related to bowel motility Outcome: Progressing

## 2023-07-29 NOTE — Progress Notes (Signed)
Progress Note   Patient: Kevin Heath ZOX:[id number] DOB: 24-Jan-1956 DOA: 07/26/2023     3 DOS: the patient was seen and examined on 07/29/2023   Brief hospital course: Mr. Abadi was admitted to the hospital with the working diagnosis of heart failure exacerbation   67 yo male with the past medical history of heart failure, COPD, hypertension, hyperlipidemia, CKD, and OSA who presented with progressive dyspnea and chest pain. Reported 2 weeks of angina symptoms with exertional chest pain, improved with rest. Endorsed dietary indiscretions and worsening lower extremity edema.  On his initial physical examination his blood pressure was 113/74, HR 73, RR 32 and 02 saturation 96%, lungs with rales bilaterally and increased work of breathing, heart with S1 and S2 present and tachycardic, irregularly irregular, abdomen with no distention and positive lower extremity edema.   Na 140, K 3,2 Cl 104 bicarbonate 25, glucose 149, bun 15 cr 1,95  AST 43 and ALT 53  BNP 234  High sensitive troponin 20, 23 and 49  Wbc 10,7 hgb 13,9 plt 200  Sars covid 19 negative  Urine analysis SG 1,009, protein 30, negative leukocytes and negative Hgb.  Toxicology screen negative   Chest radiograph with left rotation with bilateral hilar vascular congestion, bilateral central interstitial infiltrates, with right base atelectasis.   EKG 171 bpm, normal axis, normal intervals, atrial fibrillation rhythm with poor R R wave progression with no significant ST segment or T wave changes.   Patient was placed on IV heparin for anticoagulation and IV diltiazem for rate control.  IV furosemide for diuresis.   11/25 improvement in volume status, plan for coronary angiography tomorrow.  11/26 cardiac catheterization with low cardiac output.  Consulted advance heart failure, patient may need inotropic support.   Assessment and Plan: * Acute on chronic diastolic CHF (congestive heart failure) (HCC) Echocardiogram with  reduced LV systolic function with EF 30%, global hypokinesis, RV systolic function preserved, LA with mild dilatation, no pericardial effusion,   Urine output is 3,030 ml Systolic blood pressure 140 to 130 mmHg.  11/26 cardiac catheterization  RA 16 RV 50/6  PA 58/38 mean 43-45 PCWP 38 -35  Cardiac output 3,6 and index 1,72 (Fick).   Plan to continue diuresis with furosemide drip. Consulted heart failure team for possible inotropic therapy. Will request PICC line for vasoactive medications.   Discontinue metoprolol.  Continue isosorbide.    Atrial fibrillation with rapid ventricular response (HCC) Patient on sinus rhythm.    Plan to continue telemetry monitoring.  Considering lowe LV systolic function will avoid calcium channel blocker.  Discontinue metoprolol due to low output heart failure.   Anticoagulation with IV heparin, possible transition to apixaban.   CAD (coronary artery disease) Coronary angiography with minimal coronary artery disease in a right dominant system.  Continue aspirin and statin therapy.  Unstable angina now has been ruled out.   Acute kidney injury superimposed on chronic kidney disease (HCC) CKD stage 3 a. Hypokalemia.   Renal function with serum cr at 1,54 with K at 3,4 and serum bicarbonate at 28  Na 141 Mg 1.9   Plan to continue diuresis with furosemide.  K correction with Kcl Follow up renal function in am.   Obesity, class 2 Calculated BMI is 35.1 Pre diabetes continue insulin sliding scale to cover hyperglycemia..         Subjective: Patient with no chest pain, continue to have dyspnea at rest and fatigue.   Physical Exam: Vitals:   07/29/23 1415  07/29/23 1445 07/29/23 1500 07/29/23 1515  BP: (!) 137/94 (!) 137/96 137/87 (!) 144/102  Pulse: (!) 59 62 72 (!) 59  Resp: 20 (!) 24 (!) 23 (!) 21  Temp:      TempSrc:      SpO2: 93% 94% 93% 97%  Weight:      Height:       Neurology awake and alert ENT with mild  pallor Cardiovascular with S1 and S2 present and regular with no gallops, rubs or murmurs Mild JVD (wide neck) Trace lower extremity edema Abdominal wall edema Respiratory with rales bilaterally with no wheezing or rhonchi  Data Reviewed:    Family Communication: no family at the bedside   Disposition: Status is: Inpatient Remains inpatient appropriate because: heart failure   Planned Discharge Destination: Home     Author: Coralie Keens, MD 07/29/2023 3:52 PM  For on call review [email or web address].

## 2023-07-29 NOTE — Plan of Care (Signed)
Problem: Education: Goal: Knowledge of General Education information will improve Description: Including pain rating scale, medication(s)/side effects and non-pharmacologic comfort measures Outcome: Progressing   Problem: Health Behavior/Discharge Planning: Goal: Ability to manage health-related needs will improve Outcome: Progressing   Problem: Clinical Measurements: Goal: Ability to maintain clinical measurements within normal limits will improve Outcome: Progressing Goal: Will remain free from infection Outcome: Progressing Goal: Diagnostic test results will improve Outcome: Progressing Goal: Respiratory complications will improve Outcome: Progressing Goal: Cardiovascular complication will be avoided Outcome: Progressing   Problem: Activity: Goal: Risk for activity intolerance will decrease Outcome: Progressing   Problem: Nutrition: Goal: Adequate nutrition will be maintained Outcome: Progressing   Problem: Coping: Goal: Level of anxiety will decrease Outcome: Progressing   Problem: Elimination: Goal: Will not experience complications related to bowel motility Outcome: Progressing Goal: Will not experience complications related to urinary retention Outcome: Progressing   Problem: Pain Management: Goal: General experience of comfort will improve Outcome: Progressing   Problem: Safety: Goal: Ability to remain free from injury will improve Outcome: Progressing   Problem: Skin Integrity: Goal: Risk for impaired skin integrity will decrease Outcome: Progressing   Problem: Education: Goal: Ability to describe self-care measures that may prevent or decrease complications (Diabetes Survival Skills Education) will improve Outcome: Progressing Goal: Individualized Educational Video(s) Outcome: Progressing   Problem: Coping: Goal: Ability to adjust to condition or change in health will improve Outcome: Progressing   Problem: Fluid Volume: Goal: Ability to  maintain a balanced intake and output will improve Outcome: Progressing   Problem: Health Behavior/Discharge Planning: Goal: Ability to identify and utilize available resources and services will improve Outcome: Progressing Goal: Ability to manage health-related needs will improve Outcome: Progressing   Problem: Metabolic: Goal: Ability to maintain appropriate glucose levels will improve Outcome: Progressing   Problem: Nutritional: Goal: Maintenance of adequate nutrition will improve Outcome: Progressing Goal: Progress toward achieving an optimal weight will improve Outcome: Progressing   Problem: Skin Integrity: Goal: Risk for impaired skin integrity will decrease Outcome: Progressing   Problem: Tissue Perfusion: Goal: Adequacy of tissue perfusion will improve Outcome: Progressing   Problem: Education: Goal: Understanding of CV disease, CV risk reduction, and recovery process will improve Outcome: Progressing Goal: Individualized Educational Video(s) Outcome: Progressing   Problem: Activity: Goal: Ability to return to baseline activity level will improve Outcome: Progressing   Problem: Cardiovascular: Goal: Ability to achieve and maintain adequate cardiovascular perfusion will improve Outcome: Progressing Goal: Vascular access site(s) Level 0-1 will be maintained Outcome: Progressing   Problem: Health Behavior/Discharge Planning: Goal: Ability to safely manage health-related needs after discharge will improve Outcome: Progressing

## 2023-07-29 NOTE — Progress Notes (Signed)
Progress Note  Patient Name: Kevin Heath Date of Encounter: 07/29/2023  Primary Cardiologist: None   Subjective   Patient seen examined his bedside.  He is awake when I arrived. Plans for RHC/LHC today  Inpatient Medications    Scheduled Meds:  [START ON 07/30/2023] aspirin  81 mg Oral Daily   atorvastatin  40 mg Oral Daily   insulin aspart  0-6 Units Subcutaneous TID WC   isosorbide mononitrate  30 mg Oral Daily   metoprolol tartrate  12.5 mg Oral BID   sodium chloride flush  10 mL Intravenous Q12H   sodium chloride flush  3 mL Intravenous Q12H   Continuous Infusions:  furosemide (LASIX) 200 mg in dextrose 5 % 100 mL (2 mg/mL) infusion 8 mg/hr (07/29/23 0800)   heparin 1,200 Units/hr (07/29/23 0800)   PRN Meds: acetaminophen, ipratropium-albuterol, nitroGLYCERIN   Vital Signs    Vitals:   07/29/23 0436 07/29/23 0550 07/29/23 0750 07/29/23 0752  BP: (!) 141/95   (!) 145/95  Pulse: 63   62  Resp: 20   20  Temp: 97.8 F (36.6 C)  97.7 F (36.5 C) 97.7 F (36.5 C)  TempSrc: Oral  Oral Oral  SpO2: 99%   96%  Weight:  95.8 kg    Height:        Intake/Output Summary (Last 24 hours) at 07/29/2023 0903 Last data filed at 07/29/2023 0800 Gross per 24 hour  Intake 719.28 ml  Output 2830 ml  Net -2110.72 ml   Filed Weights   07/28/23 0406 07/28/23 1749 07/29/23 0550  Weight: 97.7 kg 98 kg 95.8 kg    Telemetry    Sinus rhythm- Personally Reviewed  ECG     - Personally Reviewed  Physical Exam     General: Comfortable, Head: Atraumatic, normal size  Eyes: PEERLA, EOMI  Neck: Supple, normal JVD Cardiac: Normal S1, S2; RRR; no murmurs, rubs, or gallops Lungs: Clear to auscultation bilaterally Abd: Soft, nontender, no hepatomegaly  Ext: warm, no edema Musculoskeletal: No deformities, BUE and BLE strength normal and equal Skin: Warm and dry, no rashes   Neuro: Alert and oriented to person, place, time, and situation, CNII-XII grossly intact, no  focal deficits  Psych: Normal mood and affect   Labs    Chemistry Recent Labs  Lab 07/26/23 1825 07/26/23 1832 07/27/23 0206 07/28/23 0325 07/29/23 0243  NA 140   < > 138 139 141  K 3.2*   < > 3.2* 3.5 3.4*  CL 104   < > 102 108 105  CO2 25  --  19* 26 28  GLUCOSE 149*   < > 281* 135* 115*  BUN 15   < > 17 20 19   CREATININE 1.95*   < > 1.84* 1.66* 1.54*  CALCIUM 9.5  --  9.5 9.4 9.7  PROT 6.6  --   --   --   --   ALBUMIN 3.7  --   --   --   --   AST 43*  --   --   --   --   ALT 53*  --   --   --   --   ALKPHOS 84  --   --   --   --   BILITOT 0.7  --   --   --   --   GFRNONAA 37*  --  40* 45* 49*  ANIONGAP 11  --  17* 5 8   < > = values  in this interval not displayed.     Hematology Recent Labs  Lab 07/26/23 1825 07/26/23 1832 07/27/23 0206 07/29/23 0243  WBC 10.7*  --  9.3 12.0*  RBC 4.90  --  4.71 4.52  HGB 13.9 15.0  15.0 13.3 13.0  HCT 44.6 44.0  44.0 42.3 41.3  MCV 91.0  --  89.8 91.4  MCH 28.4  --  28.2 28.8  MCHC 31.2  --  31.4 31.5  RDW 12.8  --  12.8 13.2  PLT 200  --  210 202    Cardiac EnzymesNo results for input(s): "TROPONINI" in the last 168 hours. No results for input(s): "TROPIPOC" in the last 168 hours.   BNP Recent Labs  Lab 07/26/23 1825  BNP 234.4*     DDimer No results for input(s): "DDIMER" in the last 168 hours.   Radiology    ECHOCARDIOGRAM COMPLETE  Result Date: 07/28/2023    ECHOCARDIOGRAM REPORT   Patient Name:   Kevin Heath Date of Exam: 07/28/2023 Medical Rec #:  [id number]        Height:       66.0 in Accession #:    [id number]       Weight:       215.4 lb Date of Birth:  11-16-1955        BSA:          2.064 m Patient Age:    67 years         BP:           132/99 mmHg Patient Gender: M                HR:           67 bpm. Exam Location:  Inpatient Procedure: 2D Echo, Cardiac Doppler, Color Doppler and Intracardiac            Opacification Agent Indications:    CHF  History:        Patient has no prior history of  Echocardiogram examinations.                 CHF, Arrythmias:Atrial Fibrillation, Signs/Symptoms:Chest Pain;                 Risk Factors:Hypertension.  Sonographer:    Vern Claude Referring Phys: Dolly Rias IMPRESSIONS  1. Left ventricular ejection fraction, by estimation, is 30%. The left ventricle has moderate to severely decreased function. The left ventricle demonstrates global hypokinesis. The left ventricular internal cavity size was mildly dilated. Left ventricular diastolic parameters are consistent with Grade II diastolic dysfunction (pseudonormalization).  2. Right ventricular systolic function is normal. The right ventricular size is normal. Tricuspid regurgitation signal is inadequate for assessing PA pressure.  3. Left atrial size was mildly dilated.  4. The mitral valve is normal in structure. Trivial mitral valve regurgitation. No evidence of mitral stenosis.  5. The aortic valve is tricuspid. Aortic valve regurgitation is not visualized. No aortic stenosis is present.  6. Aortic dilatation noted. There is mild dilatation of the ascending aorta, measuring 41 mm.  7. The inferior vena cava is dilated in size with <50% respiratory variability, suggesting right atrial pressure of 15 mmHg. FINDINGS  Left Ventricle: Left ventricular ejection fraction, by estimation, is 30%. The left ventricle has moderate to severely decreased function. The left ventricle demonstrates global hypokinesis. The left ventricular internal cavity size was mildly dilated. There is no left ventricular hypertrophy. Left ventricular diastolic parameters are consistent with Grade  II diastolic dysfunction (pseudonormalization). Right Ventricle: The right ventricular size is normal. No increase in right ventricular wall thickness. Right ventricular systolic function is normal. Tricuspid regurgitation signal is inadequate for assessing PA pressure. Left Atrium: Left atrial size was mildly dilated. Right Atrium: Right atrial size  was normal in size. Pericardium: There is no evidence of pericardial effusion. Mitral Valve: The mitral valve is normal in structure. Trivial mitral valve regurgitation. No evidence of mitral valve stenosis. MV peak gradient, 4.7 mmHg. The mean mitral valve gradient is 1.0 mmHg. Tricuspid Valve: The tricuspid valve is normal in structure. Tricuspid valve regurgitation is not demonstrated. Aortic Valve: The aortic valve is tricuspid. Aortic valve regurgitation is not visualized. No aortic stenosis is present. Aortic valve mean gradient measures 2.0 mmHg. Aortic valve peak gradient measures 3.7 mmHg. Aortic valve area, by VTI measures 2.01 cm. Pulmonic Valve: The pulmonic valve was normal in structure. Pulmonic valve regurgitation is trivial. Aorta: The aortic root is normal in size and structure and aortic dilatation noted. There is mild dilatation of the ascending aorta, measuring 41 mm. Venous: The inferior vena cava is dilated in size with less than 50% respiratory variability, suggesting right atrial pressure of 15 mmHg. IAS/Shunts: No atrial level shunt detected by color flow Doppler.  LEFT VENTRICLE PLAX 2D LVIDd:         5.80 cm LVIDs:         4.40 cm LV PW:         0.90 cm LV IVS:        0.70 cm LVOT diam:     2.10 cm LV SV:         33 LV SV Index:   16 LVOT Area:     3.46 cm  LV Volumes (MOD) LV vol d, MOD A4C: 149.0 ml LV vol s, MOD A4C: 94.0 ml LV SV MOD A4C:     149.0 ml RIGHT VENTRICLE             IVC RV Basal diam:  3.90 cm     IVC diam: 2.20 cm RV Mid diam:    2.90 cm RV S prime:     13.10 cm/s TAPSE (M-mode): 2.5 cm LEFT ATRIUM              Index        RIGHT ATRIUM           Index LA Vol (A2C):   108.0 ml 52.33 ml/m  RA Area:     14.60 cm LA Vol (A4C):   55.1 ml  26.70 ml/m  RA Volume:   35.90 ml  17.39 ml/m LA Biplane Vol: 79.0 ml  38.28 ml/m  AORTIC VALVE                    PULMONIC VALVE AV Area (Vmax):    2.19 cm     PV Vmax:          0.54 m/s AV Area (Vmean):   1.72 cm     PV Peak grad:      1.1 mmHg AV Area (VTI):     2.01 cm     PR End Diast Vel: 6.25 msec AV Vmax:           96.10 cm/s AV Vmean:          68.000 cm/s AV VTI:            0.164 m AV Peak Grad:  3.7 mmHg AV Mean Grad:      2.0 mmHg LVOT Vmax:         60.70 cm/s LVOT Vmean:        33.800 cm/s LVOT VTI:          0.095 m LVOT/AV VTI ratio: 0.58  AORTA Ao Root diam: 3.50 cm Ao Asc diam:  4.07 cm MITRAL VALVE MV Area (PHT): 5.75 cm    SHUNTS MV Area VTI:   1.34 cm    Systemic VTI:  0.10 m MV Peak grad:  4.7 mmHg    Systemic Diam: 2.10 cm MV Mean grad:  1.0 mmHg MV Vmax:       1.08 m/s MV Vmean:      42.6 cm/s MV Decel Time: 132 msec MV E velocity: 94.30 cm/s Dalton McleanMD Electronically signed by Wilfred Lacy Signature Date/Time: 07/28/2023/11:15:43 AM    Final     Cardiac Studies   Echo pending  Patient Profile     67 y.o. 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 He feels significantly better he tells me. Still agreeable for RHC/LHC today.   Cr still continues to improve - suspect his AKI was prerenal.     Continue heparin gtt , Asa 81 mg daily and statin.    Informed Consent   Shared Decision Making/Informed Consent The risks [stroke (1 in 1000), death (1 in 1000), kidney failure [usually temporary] (1 in 500), bleeding (1 in 200), allergic reaction [possibly serious] (1 in 200)], benefits (diagnostic support and management of coronary artery disease) and alternatives of a cardiac catheterization were discussed in detail with Mr. Bolash and he is willing to proceed.      He is back in sinus rhythm, chadsvasc score is 42 (age greater than 25, hypertension, history of CHF)-post catheterization he will need to be transition to oral  anticoagulant like Eliquis 5 mg twice daily.  Echo showed EF  30%, not completely unexpected - will start GDMT post cath. Now will transition lopressor toprol xl. Has allergies to lisinopril which is swelling need to understand to make sure this is not angioedema which would complicate optimizing his GDMT, Also is allergic to aldactone.   Aortic dilatation - need outpatient following for repeat imaging      For questions or updates, please contact [facility] Please consult [email or web address] for contact info under Cardiology/STEMI.      Signed, Thomasene Ripple, DO  07/29/2023, 9:03 AM

## 2023-07-29 NOTE — Progress Notes (Signed)
Discontinued BB. I have reviewed RHC with Dr. Servando Salina and will consult AHF team.

## 2023-07-29 NOTE — H&P (View-Only) (Signed)
Progress Note  Patient Name: Kevin Heath Date of Encounter: 07/29/2023  Primary Cardiologist: None   Subjective   Patient seen examined his bedside.  He is awake when I arrived. Plans for RHC/LHC today  Inpatient Medications    Scheduled Meds:  [START ON 07/30/2023] aspirin  81 mg Oral Daily   atorvastatin  40 mg Oral Daily   insulin aspart  0-6 Units Subcutaneous TID WC   isosorbide mononitrate  30 mg Oral Daily   metoprolol tartrate  12.5 mg Oral BID   sodium chloride flush  10 mL Intravenous Q12H   sodium chloride flush  3 mL Intravenous Q12H   Continuous Infusions:  furosemide (LASIX) 200 mg in dextrose 5 % 100 mL (2 mg/mL) infusion 8 mg/hr (07/29/23 0800)   heparin 1,200 Units/hr (07/29/23 0800)   PRN Meds: acetaminophen, ipratropium-albuterol, nitroGLYCERIN   Vital Signs    Vitals:   07/29/23 0436 07/29/23 0550 07/29/23 0750 07/29/23 0752  BP: (!) 141/95   (!) 145/95  Pulse: 63   62  Resp: 20   20  Temp: 97.8 F (36.6 C)  97.7 F (36.5 C) 97.7 F (36.5 C)  TempSrc: Oral  Oral Oral  SpO2: 99%   96%  Weight:  95.8 kg    Height:        Intake/Output Summary (Last 24 hours) at 07/29/2023 0903 Last data filed at 07/29/2023 0800 Gross per 24 hour  Intake 719.28 ml  Output 2830 ml  Net -2110.72 ml   Filed Weights   07/28/23 0406 07/28/23 1749 07/29/23 0550  Weight: 97.7 kg 98 kg 95.8 kg    Telemetry    Sinus rhythm- Personally Reviewed  ECG     - Personally Reviewed  Physical Exam     General: Comfortable, Head: Atraumatic, normal size  Eyes: PEERLA, EOMI  Neck: Supple, normal JVD Cardiac: Normal S1, S2; RRR; no murmurs, rubs, or gallops Lungs: Clear to auscultation bilaterally Abd: Soft, nontender, no hepatomegaly  Ext: warm, no edema Musculoskeletal: No deformities, BUE and BLE strength normal and equal Skin: Warm and dry, no rashes   Neuro: Alert and oriented to person, place, time, and situation, CNII-XII grossly intact, no  focal deficits  Psych: Normal mood and affect   Labs    Chemistry Recent Labs  Lab 07/26/23 1825 07/26/23 1832 07/27/23 0206 07/28/23 0325 07/29/23 0243  NA 140   < > 138 139 141  K 3.2*   < > 3.2* 3.5 3.4*  CL 104   < > 102 108 105  CO2 25  --  19* 26 28  GLUCOSE 149*   < > 281* 135* 115*  BUN 15   < > 17 20 19   CREATININE 1.95*   < > 1.84* 1.66* 1.54*  CALCIUM 9.5  --  9.5 9.4 9.7  PROT 6.6  --   --   --   --   ALBUMIN 3.7  --   --   --   --   AST 43*  --   --   --   --   ALT 53*  --   --   --   --   ALKPHOS 84  --   --   --   --   BILITOT 0.7  --   --   --   --   GFRNONAA 37*  --  40* 45* 49*  ANIONGAP 11  --  17* 5 8   < > = values  in this interval not displayed.     Hematology Recent Labs  Lab 07/26/23 1825 07/26/23 1832 07/27/23 0206 07/29/23 0243  WBC 10.7*  --  9.3 12.0*  RBC 4.90  --  4.71 4.52  HGB 13.9 15.0  15.0 13.3 13.0  HCT 44.6 44.0  44.0 42.3 41.3  MCV 91.0  --  89.8 91.4  MCH 28.4  --  28.2 28.8  MCHC 31.2  --  31.4 31.5  RDW 12.8  --  12.8 13.2  PLT 200  --  210 202    Cardiac EnzymesNo results for input(s): "TROPONINI" in the last 168 hours. No results for input(s): "TROPIPOC" in the last 168 hours.   BNP Recent Labs  Lab 07/26/23 1825  BNP 234.4*     DDimer No results for input(s): "DDIMER" in the last 168 hours.   Radiology    ECHOCARDIOGRAM COMPLETE  Result Date: 07/28/2023    ECHOCARDIOGRAM REPORT   Patient Name:   Kevin Heath Date of Exam: 07/28/2023 Medical Rec #:  [id number]        Height:       66.0 in Accession #:    [id number]       Weight:       215.4 lb Date of Birth:  08/24/1956        BSA:          2.064 m Patient Age:    67 years         BP:           132/99 mmHg Patient Gender: M                HR:           67 bpm. Exam Location:  Inpatient Procedure: 2D Echo, Cardiac Doppler, Color Doppler and Intracardiac            Opacification Agent Indications:    CHF  History:        Patient has no prior history of  Echocardiogram examinations.                 CHF, Arrythmias:Atrial Fibrillation, Signs/Symptoms:Chest Pain;                 Risk Factors:Hypertension.  Sonographer:    Vern Claude Referring Phys: Dolly Rias IMPRESSIONS  1. Left ventricular ejection fraction, by estimation, is 30%. The left ventricle has moderate to severely decreased function. The left ventricle demonstrates global hypokinesis. The left ventricular internal cavity size was mildly dilated. Left ventricular diastolic parameters are consistent with Grade II diastolic dysfunction (pseudonormalization).  2. Right ventricular systolic function is normal. The right ventricular size is normal. Tricuspid regurgitation signal is inadequate for assessing PA pressure.  3. Left atrial size was mildly dilated.  4. The mitral valve is normal in structure. Trivial mitral valve regurgitation. No evidence of mitral stenosis.  5. The aortic valve is tricuspid. Aortic valve regurgitation is not visualized. No aortic stenosis is present.  6. Aortic dilatation noted. There is mild dilatation of the ascending aorta, measuring 41 mm.  7. The inferior vena cava is dilated in size with <50% respiratory variability, suggesting right atrial pressure of 15 mmHg. FINDINGS  Left Ventricle: Left ventricular ejection fraction, by estimation, is 30%. The left ventricle has moderate to severely decreased function. The left ventricle demonstrates global hypokinesis. The left ventricular internal cavity size was mildly dilated. There is no left ventricular hypertrophy. Left ventricular diastolic parameters are consistent with Grade  II diastolic dysfunction (pseudonormalization). Right Ventricle: The right ventricular size is normal. No increase in right ventricular wall thickness. Right ventricular systolic function is normal. Tricuspid regurgitation signal is inadequate for assessing PA pressure. Left Atrium: Left atrial size was mildly dilated. Right Atrium: Right atrial size  was normal in size. Pericardium: There is no evidence of pericardial effusion. Mitral Valve: The mitral valve is normal in structure. Trivial mitral valve regurgitation. No evidence of mitral valve stenosis. MV peak gradient, 4.7 mmHg. The mean mitral valve gradient is 1.0 mmHg. Tricuspid Valve: The tricuspid valve is normal in structure. Tricuspid valve regurgitation is not demonstrated. Aortic Valve: The aortic valve is tricuspid. Aortic valve regurgitation is not visualized. No aortic stenosis is present. Aortic valve mean gradient measures 2.0 mmHg. Aortic valve peak gradient measures 3.7 mmHg. Aortic valve area, by VTI measures 2.01 cm. Pulmonic Valve: The pulmonic valve was normal in structure. Pulmonic valve regurgitation is trivial. Aorta: The aortic root is normal in size and structure and aortic dilatation noted. There is mild dilatation of the ascending aorta, measuring 41 mm. Venous: The inferior vena cava is dilated in size with less than 50% respiratory variability, suggesting right atrial pressure of 15 mmHg. IAS/Shunts: No atrial level shunt detected by color flow Doppler.  LEFT VENTRICLE PLAX 2D LVIDd:         5.80 cm LVIDs:         4.40 cm LV PW:         0.90 cm LV IVS:        0.70 cm LVOT diam:     2.10 cm LV SV:         33 LV SV Index:   16 LVOT Area:     3.46 cm  LV Volumes (MOD) LV vol d, MOD A4C: 149.0 ml LV vol s, MOD A4C: 94.0 ml LV SV MOD A4C:     149.0 ml RIGHT VENTRICLE             IVC RV Basal diam:  3.90 cm     IVC diam: 2.20 cm RV Mid diam:    2.90 cm RV S prime:     13.10 cm/s TAPSE (M-mode): 2.5 cm LEFT ATRIUM              Index        RIGHT ATRIUM           Index LA Vol (A2C):   108.0 ml 52.33 ml/m  RA Area:     14.60 cm LA Vol (A4C):   55.1 ml  26.70 ml/m  RA Volume:   35.90 ml  17.39 ml/m LA Biplane Vol: 79.0 ml  38.28 ml/m  AORTIC VALVE                    PULMONIC VALVE AV Area (Vmax):    2.19 cm     PV Vmax:          0.54 m/s AV Area (Vmean):   1.72 cm     PV Peak grad:      1.1 mmHg AV Area (VTI):     2.01 cm     PR End Diast Vel: 6.25 msec AV Vmax:           96.10 cm/s AV Vmean:          68.000 cm/s AV VTI:            0.164 m AV Peak Grad:  3.7 mmHg AV Mean Grad:      2.0 mmHg LVOT Vmax:         60.70 cm/s LVOT Vmean:        33.800 cm/s LVOT VTI:          0.095 m LVOT/AV VTI ratio: 0.58  AORTA Ao Root diam: 3.50 cm Ao Asc diam:  4.07 cm MITRAL VALVE MV Area (PHT): 5.75 cm    SHUNTS MV Area VTI:   1.34 cm    Systemic VTI:  0.10 m MV Peak grad:  4.7 mmHg    Systemic Diam: 2.10 cm MV Mean grad:  1.0 mmHg MV Vmax:       1.08 m/s MV Vmean:      42.6 cm/s MV Decel Time: 132 msec MV E velocity: 94.30 cm/s Dalton McleanMD Electronically signed by Wilfred Lacy Signature Date/Time: 07/28/2023/11:15:43 AM    Final     Cardiac Studies   Echo pending  Patient Profile     67 y.o. 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 He feels significantly better he tells me. Still agreeable for RHC/LHC today.   Cr still continues to improve - suspect his AKI was prerenal.     Continue heparin gtt , Asa 81 mg daily and statin.    Informed Consent   Shared Decision Making/Informed Consent The risks [stroke (1 in 1000), death (1 in 1000), kidney failure [usually temporary] (1 in 500), bleeding (1 in 200), allergic reaction [possibly serious] (1 in 200)], benefits (diagnostic support and management of coronary artery disease) and alternatives of a cardiac catheterization were discussed in detail with Mr. Cerbone and he is willing to proceed.      He is back in sinus rhythm, chadsvasc score is 2 (age greater than 6, hypertension, history of CHF)-post catheterization he will need to be transition to oral  anticoagulant like Eliquis 5 mg twice daily.  Echo showed EF  30%, not completely unexpected - will start GDMT post cath. Now will transition lopressor toprol xl. Has allergies to lisinopril which is swelling need to understand to make sure this is not angioedema which would complicate optimizing his GDMT, Also is allergic to aldactone.   Aortic dilatation - need outpatient following for repeat imaging      For questions or updates, please contact [facility] Please consult [email or web address] for contact info under Cardiology/STEMI.      Signed, Thomasene Ripple, DO  07/29/2023, 9:03 AM

## 2023-07-29 NOTE — Progress Notes (Signed)
PICC ordered for the administration of Milrinone. IV team asked when PICC would be placed. Unknown when PICC will get placed. Milrinone started through a PIV by MD Arrien verbal order.

## 2023-07-29 NOTE — Consult Note (Signed)
Advanced Heart Failure Team Consult Note   Primary Physician: Clinic, [facility] PCP-Cardiologist:  None  Reason for Consultation: Acute on chronic systolic and diastolic heart failure  HPI:    Kevin Heath is seen today for evaluation of acute on chronic systolic and diastolic heart failure at the request of Dr. Ella Jubilee.   He is a 67 y.o. male with a hx of HTN, HLD, CKD, OSA, HF with previously improved EF, CAD, and COPD.  He is followed by [facility] and last seen 01/29/23, at that time had been doing well and stable on Lasix, Imdur, Hydralazine, and Eplerenone.   Echo 6/24: EF 40-45% nl RV, prolapse of posterior MV leaflet w mild MR.  Recently seen 8/24 at the [facility] ED for chest pain and SOB. CTA negative for PE. He was treated for mild CHF exacerbation with IV lasix in ED and discharged home. Since being home has been feeling fine up until 2 weeks ago when he developed severe CP that has remained intermittent with increased SOB and edema. Compliant with medications at home.   Presented 11/23 to [facility] by EMS for with respiratory distress after complaining of chest pain and shortness of breath. Labs notable for K 3.2, sCr 1.95, BNP 234, Trop 20>23. He was placed on BiPAP and continuous albuterol for 1hr, at which point he developed atrial fibrillation RVR in the 170s. He was started on a diltiazem and heparin drip and converted to NSR, however developed bradycardia to 40s overnight, and Diltiazem was subsequently stopped. Cardiology was consulted and he was started on Lasix 80 IV BID for volume overload. However given significance of volume was transitioned to a lasix gtt 8/hr with adequate response. Echo and cardiac cath were ordered.   Echo 11/26: EF 30% gHK, G2DD, RV nl, triv MR.   R/LHC and was scheduled for today for ischemic evaluation which showed minimal CAD in a R dominant system, severe pulmonary hypertension, elevated filling pressure and reduced cardiac output. Mean  PAP 43 to 45, PCWP 38, RA 14, CO/CI (fick) 3.62/1.75.  On exam now having NSVT with 17 and 25 beat episodes. Advanced Heart Failure to follow for management of low output heart failure and volume overload.   Home Medications Prior to Admission medications   Medication Sig Start Date End Date Taking? Authorizing Provider  albuterol (VENTOLIN HFA) 108 (90 Base) MCG/ACT inhaler Inhale 2 puffs into the lungs every 6 (six) hours as needed for shortness of breath. 03/21/23  Yes [provider]  amLODipine (NORVASC) 10 MG tablet Take 10 mg by mouth daily. 06/20/10  Yes [provider]  aspirin 81 MG chewable tablet Chew 81 mg by mouth daily. 11/24/08  Yes [provider]  cetirizine (ZYRTEC) 10 MG tablet Take 10 mg by mouth daily as needed for allergies. 12/19/22  Yes [provider]  eplerenone (INSPRA) 50 MG tablet Take 50 mg by mouth daily. 12/19/22  Yes [provider]  furosemide (LASIX) 40 MG tablet Take 80 mg by mouth daily. 01/22/23  Yes [provider]  hydrALAZINE (APRESOLINE) 25 MG tablet Take 25 mg by mouth daily. 12/19/22  Yes [provider]  isosorbide mononitrate (IMDUR) 30 MG 24 hr tablet Take 30 mg by mouth daily. 12/19/22  Yes [provider]  magnesium oxide (MAG-OX) 400 MG tablet Take 400 mg by mouth daily. 12/19/22  Yes [provider]  potassium chloride (KLOR-CON) 10 MEQ tablet Take 30 mEq by mouth 2 (two) times daily.  12/19/22  Yes [provider]  simvastatin (ZOCOR) 10 MG tablet Take 10 mg by mouth daily. 12/19/22  Yes [provider]    Past Medical History: Past Medical History:  Diagnosis Date   Asthma    CHF (congestive heart failure) (HCC)    CKD (chronic kidney disease)    COPD (chronic obstructive pulmonary disease) (HCC)    HLD (hyperlipidemia)    HTN (hypertension)    MI (myocardial infarction) (HCC)    Sleep apnea    Past Surgical History: Past Surgical History:   Procedure Laterality Date   NO PAST SURGERIES     RIGHT/LEFT HEART CATH AND CORONARY ANGIOGRAPHY N/A 07/29/2023   Procedure: RIGHT/LEFT HEART CATH AND CORONARY ANGIOGRAPHY;  Surgeon: Marykay Lex, MD;  Location: [facility] INVASIVE CV LAB;  Service: Cardiovascular;  Laterality: N/A;   Family History: History reviewed. No pertinent family history.  Social History: Social History   Socioeconomic History   Marital status: Significant Other    Spouse name: Not on file   Number of children: Not on file   Years of education: Not on file   Highest education level: Not on file  Occupational History   Not on file  Tobacco Use   Smoking status: Unknown   Smokeless tobacco: Not on file  Substance and Sexual Activity   Alcohol use: Not Currently   Drug use: Not on file   Sexual activity: Not on file  Other Topics Concern   Not on file  Social History Narrative   Not on file   Social Determinants of Health   Financial Resource Strain: Not on file  Food Insecurity: No Food Insecurity (07/26/2023)   Hunger Vital Sign    Worried About Running Out of Food in the Last Year: Never true    Ran Out of Food in the Last Year: Never true  Transportation Needs: No Transportation Needs (07/26/2023)   PRAPARE - Administrator, Civil Service (Medical): No    Lack of Transportation (Non-Medical): No  Physical Activity: Not on file  Stress: Not on file  Social Connections: Unknown (04/29/2023)   Received from [facility]   Social Network    Social Network: Not on file    Allergies:  Allergies  Allergen Reactions   Angiotensin Anaphylaxis   Ace Inhibitors Swelling   Lisinopril Swelling    Respiratory arrest   Sertraline Hcl     Lightheaded   Spironolactone     Engorgement of breasts    Objective:    Vital Signs:   Temp:  [97.7 F (36.5 C)-98.9 F (37.2 C)] 97.7 F (36.5 C) (11/26 0752) Pulse Rate:  [0-70] 62 (11/26 1445) Resp:  [13-27] 24 (11/26 1445) BP:  (132-156)/(88-122) 137/96 (11/26 1445) SpO2:  [81 %-99 %] 94 % (11/26 1445) Weight:  [95.8 kg-98 kg] 95.8 kg (11/26 0550) Last BM Date : 07/28/23  Weight change: Filed Weights   07/28/23 0406 07/28/23 1749 07/29/23 0550  Weight: 97.7 kg 98 kg 95.8 kg    Intake/Output:   Intake/Output Summary (Last 24 hours) at 07/29/2023 1456 Last data filed at 07/29/2023 1425 Gross per 24 hour  Intake 741.28 ml  Output 3300 ml  Net -2558.72 ml      Physical Exam    General:  Weak appearing. Labored breathing HEENT: normal Neck: supple. JVP 12cm. Carotids 2+ bilat; no bruits. No lymphadenopathy or thyromegaly appreciated. Cor: PMI nondisplaced. S3 Murmur 2/6 Lungs: clear, diminished in bases Abdomen: taut, nontender, distended.  No hepatosplenomegaly. No bruits or masses. Good bowel sounds. Extremities: no cyanosis, clubbing, rash. 2+ edema Neuro: alert & orientedx3, cranial nerves grossly intact. moves all 4 extremities w/o difficulty. Affect pleasant  Telemetry   SR in 60-70s. 2 runs of NSVT (17 and 25 beats). (personally reviewed)  EKG    11/24 0800 Sinus brady with 1st deg AVB  Labs   Basic Metabolic Panel: Recent Labs  Lab 07/26/23 1825 07/26/23 1832 07/27/23 0206 07/28/23 0325 07/29/23 0243 07/29/23 1231 07/29/23 1240 07/29/23 1241 07/29/23 1247 07/29/23 1248  NA 140 142  142 138 139 141 144 139 139 144 143  K 3.2* 3.2*  3.2* 3.2* 3.5 3.4* 3.5 3.5 3.4* 3.6 3.5  CL 104 106 102 108 105  --   --   --   --   --   CO2 25  --  19* 26 28  --   --   --   --   --   GLUCOSE 149* 147* 281* 135* 115*  --   --   --   --   --   BUN 15 17 17 20 19   --   --   --   --   --   CREATININE 1.95* 1.50* 1.84* 1.66* 1.54*  --   --   --   --   --   CALCIUM 9.5  --  9.5 9.4 9.7  --   --   --   --   --   MG  --   --  2.1 2.1 1.9  --   --   --   --   --   PHOS  --   --  3.0  --   --   --   --   --   --   --     Liver Function Tests: Recent Labs  Lab 07/26/23 1825  AST 43*  ALT 53*   ALKPHOS 84  BILITOT 0.7  PROT 6.6  ALBUMIN 3.7   Recent Labs  Lab 07/27/23 0206  LIPASE 25   No results for input(s): "AMMONIA" in the last 168 hours.  CBC: Recent Labs  Lab 07/26/23 1825 07/26/23 1832 07/27/23 0206 07/29/23 0243 07/29/23 1231 07/29/23 1240 07/29/23 1241 07/29/23 1247 07/29/23 1248  WBC 10.7*  --  9.3 12.0*  --   --   --   --   --   NEUTROABS 5.3  --   --   --   --   --   --   --   --   HGB 13.9   < > 13.3 13.0 13.9 14.3 14.3 14.6 14.3  HCT 44.6   < > 42.3 41.3 41.0 42.0 42.0 43.0 42.0  MCV 91.0  --  89.8 91.4  --   --   --   --   --   PLT 200  --  210 202  --   --   --   --   --    < > = values in this interval not displayed.    Cardiac Enzymes: No results for input(s): "CKTOTAL", "CKMB", "CKMBINDEX", "TROPONINI" in the last 168 hours.  BNP: BNP (last 3 results) Recent Labs    01/11/23 1705 07/26/23 1825  BNP 237.1* 234.4*    ProBNP (last 3 results) No results for input(s): "PROBNP" in the last 8760 hours.   CBG: Recent Labs  Lab 07/28/23 1131 07/28/23 1559 07/28/23 2129 07/29/23 0603 07/29/23 1113  GLUCAP 92  98 150* 122* 117*    Coagulation Studies: Recent Labs    07/27/23 0206  LABPROT 15.1  INR 1.2     Imaging   CARDIAC CATHETERIZATION  Addendum Date: 07/29/2023   POST CATH FINDINGS Angiographically minimal CAD in a Right dominant system Borderline Severe Pulmonary Hypertension-Pulmonary Venous Congestion from Combined Systolic and Diastolic Heart Failure Mean PAP 43 to 45 mmHg with PCWP of roughly 38 mmHg and LVEDP of 40 mmHg. Cardiac output-index by Fick 3.62-1.75 RECOMMENDATIONS   Hemodynamic findings consistent with severe pulmonary hypertension.   There is no aortic valve stenosis.   Anticipated discharge date to be determined.   I have given 40 mg IV Lasix in the Cath Lab and will reinitiate Lasix drip. Gentle post cath hydration with 30 mL an hour for 6 hours.   No indication for antiplatelet therapy at this time  . Bryan Lemma, MD   Result Date: 07/29/2023 POST CATH FINDINGS Angiographically minimal CAD in a Right dominant system Borderline Severe Pulmonary Hypertension-Pulmonary Venous Congestion from Combined Systolic and Diastolic Heart Failure Mean PAP 43 to 45 mmHg with PCWP of roughly 38 mmHg and LVEDP of 40 mmHg. Cardiac output-index by Fick 3.62-1.75 RECOMMENDATIONS   Hemodynamic findings consistent with severe pulmonary hypertension.   There is no aortic valve stenosis.   Anticipated discharge date to be determined.   I have given 40 mg IV Lasix in the Cath Lab and will reinitiate Lasix drip. Gentle post cath hydration with 30 mL an hour for 6 hours.   No indication for antiplatelet therapy at this time . Bryan Lemma, MD    Medications:     Current Medications:  [START ON 07/30/2023] aspirin  81 mg Oral Daily   atorvastatin  40 mg Oral Daily   heparin  5,000 Units Subcutaneous Q8H   insulin aspart  0-6 Units Subcutaneous TID WC   isosorbide mononitrate  30 mg Oral Daily   metoprolol tartrate  12.5 mg Oral BID   sodium chloride flush  3 mL Intravenous Q12H   sodium chloride flush  3 mL Intravenous Q12H    Infusions:  sodium chloride 30 mL/hr at 07/29/23 1419   sodium chloride     furosemide (LASIX) 200 mg in dextrose 5 % 100 mL (2 mg/mL) infusion 8 mg/hr (07/29/23 1447)    Patient Profile   Kevin Heath is a 67 y.o. male with a hx of HTN, HLD, CKD, OSA, HFimpEF, CAD, COPD. Now admitted for CHF exacerbation and atrial fibrillation (new diagnosis)  Assessment/Plan   Acute on Chronic Systolic and Diastolic Heart Failure - Echo 1/61: EF 40-45% nl RV, prolapse of posterior MV leaflet w mild MR - Echo 11/26: EF 30% gHK, G2DD, RV nl, triv MR.  - R/LHC: Minimal CAD in R dominant. mean PAP 43 to 45, PCWP 38, RA 14, CO/CI (fick) 3.62/1.75. - Place PICC line for CVP monitoring. Check Co-ox. Lactic Acid pending - Severely elevated filling pressure. Lasix gtt 8/hr, increase once  started on Milrinone.  - Milrinone 0.125 mcg/kg/min - Continue Imdur 30 daily - No beta blocker with low output - Start eplerenone tomorrow if renal function stable - Consider cMRI this week once better diuresed  2. NSVT PVCs - 17 and 25 beats of NSVT today - Amio gtt 30/hr while starting/on Milrinone - Give 2g Mg - K 3.5, supp  3. Afib RVR (new onset) - in there setting of continuous albuterol and Bipap and volume overload - diltiazem gtt and heparin gtt  started in ED, converted to SR. Now off for bradycardia. - in NSR on tele - amio as above  4. AKI on CKD3a - cardio-renal. Last known sCr 1.38. - sCr on admit 1.95 - Improving with diuresis 1.54 today  5. CAD - presented with chest pain/pressure. Improved today. - minimal CAD noted on R dominate system - Continue aspirin and statin  6. Pulmonary Hypertension - Who group II, ?some component of III  - Diuresis as above - Start milrinone  7. HTN - Continue Imdur 30 daily - Start Milrinone - Consider addition Hydral 25 TID if remains elevated  8. COPD/OSA - on CPAP at home  Length of Stay: 3  [place] Kyah Buesing, NP  07/29/2023, 2:56 PM  Advanced Heart Failure Team Pager [phone] (M-F; 7a - 5p)  Please contact [facility] for night-coverage after hours (4p -7a ) and weekends on [email or web address]

## 2023-07-29 NOTE — Progress Notes (Addendum)
ANTICOAGULATION CONSULT NOTE  Pharmacy Consult for Heparin Indication: atrial fibrillation  Allergies  Allergen Reactions   Angiotensin Anaphylaxis   Ace Inhibitors Swelling   Lisinopril Swelling    Respiratory arrest   Sertraline Hcl     Lightheaded   Spironolactone     Engorgement of breasts    Patient Measurements: Height: 5\' 6"  (167.6 cm) Weight: 95.8 kg (211 lb 3.2 oz) IBW/kg (Calculated) : 63.8 Heparin Dosing Weight: 86 kg  Vital Signs: Temp: 97.7 F (36.5 C) (11/26 0752) Temp Source: Oral (11/26 0752) BP: 145/95 (11/26 0752) Pulse Rate: 62 (11/26 0752)  Labs: Recent Labs    07/26/23 1825 07/26/23 1832 07/26/23 2030 07/27/23 0206 07/27/23 0810 07/27/23 1609 07/28/23 0325 07/29/23 0243  HGB 13.9 15.0  15.0  --  13.3  --   --   --  13.0  HCT 44.6 44.0  44.0  --  42.3  --   --   --  41.3  PLT 200  --   --  210  --   --   --  202  LABPROT  --   --   --  15.1  --   --   --   --   INR  --   --   --  1.2  --   --   --   --   HEPARINUNFRC  --   --   --   --    < > 0.56 0.48 0.34  CREATININE 1.95* 1.50*  --  1.84*  --   --  1.66* 1.54*  TROPONINIHS 20*  --  23* 49*  --   --   --   --    < > = values in this interval not displayed.    Estimated Creatinine Clearance: 51.1 mL/min (A) (by C-G formula based on SCr of 1.54 mg/dL (H)).   Medical History: Past Medical History:  Diagnosis Date   Asthma    CHF (congestive heart failure) (HCC)    CKD (chronic kidney disease)    COPD (chronic obstructive pulmonary disease) (HCC)    HLD (hyperlipidemia)    HTN (hypertension)    MI (myocardial infarction) (HCC)    Sleep apnea    Assessment: 2 yom with a history of HTN, COPD, HF. Patient is presenting with SOB. Heparin per pharmacy consult placed for atrial fibrillation. Patient started on BiPAP and continuous albuterol on arrival to ED. While on BiPAP experienced AF w/ HR in the 170s. Patient is not on anticoagulation prior to arrival.  Heparin level is  therapeutic at 0.34 on 1200 units/hr. Hgb 13, PLTc 202. No s/sx of bleeding or infusion issues.   Goal of Therapy:  Heparin level 0.3-0.7 units/ml Monitor platelets by anticoagulation protocol: Yes   Plan:  Continue heparin infusion at 1200 units/hr Check heparin level with AM labs and daily while on heparin Continue to monitor H&H and platelets F/u after cath today   Thank you for allowing pharmacy to participate in this patient's care,  Sherron Monday, PharmD, BCCCP Clinical Pharmacist  Phone: [phone] 07/29/2023 9:40 AM  Please check AMION for all [facility] phone numbers After 10:00 PM, call Main Pharmacy [phone]   ADDENDUM Underwent cardiac cath finding minimal CAD - no intervention. Had been on heparin prior to cath for Afib - discussed with team and will restart heparin 2 hours after TR band removed. Will restart at previously therapeutic rate of 1200 units/hr. No s/sx of bleeding.   Thank you for  allowing pharmacy to participate in this patient's care,  Sherron Monday, PharmD, BCCCP Clinical Pharmacist   ADDENDUM Now transitioning to Eliquis 5mg  PO BID. Will provide education and co-pay check prior to discharge.  Loralee Pacas, PharmD, BCPS 07/29/2023 4:21 PM

## 2023-07-30 ENCOUNTER — Encounter (HOSPITAL_COMMUNITY): Payer: Self-pay | Admitting: Cardiology

## 2023-07-30 DIAGNOSIS — N179 Acute kidney failure, unspecified: Secondary | ICD-10-CM | POA: Diagnosis not present

## 2023-07-30 DIAGNOSIS — I2510 Atherosclerotic heart disease of native coronary artery without angina pectoris: Secondary | ICD-10-CM | POA: Diagnosis not present

## 2023-07-30 DIAGNOSIS — I5033 Acute on chronic diastolic (congestive) heart failure: Secondary | ICD-10-CM | POA: Diagnosis not present

## 2023-07-30 DIAGNOSIS — I4891 Unspecified atrial fibrillation: Secondary | ICD-10-CM | POA: Diagnosis not present

## 2023-07-30 LAB — GLUCOSE, CAPILLARY
Glucose-Capillary: 138 mg/dL — ABNORMAL HIGH (ref 70–99)
Glucose-Capillary: 102 mg/dL — ABNORMAL HIGH (ref 70–99)
Glucose-Capillary: 113 mg/dL — ABNORMAL HIGH (ref 70–99)
Glucose-Capillary: 136 mg/dL — ABNORMAL HIGH (ref 70–99)

## 2023-07-30 LAB — CBC
HCT: 43.3 % (ref 39.0–52.0)
Hemoglobin: 13.6 g/dL (ref 13.0–17.0)
MCH: 28 pg (ref 26.0–34.0)
MCHC: 31.4 g/dL (ref 30.0–36.0)
MCV: 89.3 fL (ref 80.0–100.0)
Platelets: 196 10*3/uL (ref 150–400)
RBC: 4.85 MIL/uL (ref 4.22–5.81)
RDW: 13 % (ref 11.5–15.5)
WBC: 9.8 10*3/uL (ref 4.0–10.5)
nRBC: 0 % (ref 0.0–0.2)

## 2023-07-30 LAB — BASIC METABOLIC PANEL
Anion gap: 8 (ref 5–15)
Anion gap: 9 (ref 5–15)
BUN: 22 mg/dL (ref 8–23)
BUN: 23 mg/dL (ref 8–23)
CO2: 29 mmol/L (ref 22–32)
CO2: 28 mmol/L (ref 22–32)
Calcium: 10 mg/dL (ref 8.9–10.3)
Calcium: 9.9 mg/dL (ref 8.9–10.3)
Chloride: 104 mmol/L (ref 98–111)
Chloride: 98 mmol/L (ref 98–111)
Creatinine, Ser: 1.67 mg/dL — ABNORMAL HIGH (ref 0.61–1.24)
Creatinine, Ser: 1.68 mg/dL — ABNORMAL HIGH (ref 0.61–1.24)
GFR, Estimated: 45 mL/min — ABNORMAL LOW (ref >60)
GFR, Estimated: 45 mL/min — ABNORMAL LOW (ref >60)
Glucose, Bld: 131 mg/dL — ABNORMAL HIGH (ref 70–99)
Glucose, Bld: 267 mg/dL — ABNORMAL HIGH (ref 70–99)
Potassium: 3.7 mmol/L (ref 3.5–5.1)
Potassium: 3.2 mmol/L — ABNORMAL LOW (ref 3.5–5.1)
Sodium: 141 mmol/L (ref 135–145)
Sodium: 135 mmol/L (ref 135–145)

## 2023-07-30 LAB — MAGNESIUM: Magnesium: 2.2 mg/dL (ref 1.7–2.4)

## 2023-07-30 LAB — COOXEMETRY PANEL
Carboxyhemoglobin: 2.1 % — ABNORMAL HIGH (ref 0.5–1.5)
Methemoglobin: 0.9 % (ref 0.0–1.5)
O2 Saturation: 67.5 %
Total hemoglobin: 14.5 g/dL (ref 12.0–16.0)

## 2023-07-30 MED ORDER — CHLORHEXIDINE GLUCONATE CLOTH 2 % EX PADS
6.0000 | MEDICATED_PAD | Freq: Every day | CUTANEOUS | Status: DC
  Administered 2023-07-30 – 2023-08-02 (×4): 6 via TOPICAL

## 2023-07-30 MED ORDER — ISOSORB DINITRATE-HYDRALAZINE 20-37.5 MG PO TABS
1.5000 | ORAL_TABLET | Freq: Three times a day (TID) | ORAL | Status: DC
  Administered 2023-07-31 – 2023-08-01 (×4): 1.5 via ORAL
  Filled 2023-07-30 (×4): qty 2

## 2023-07-30 MED ORDER — POTASSIUM CHLORIDE CRYS ER 20 MEQ PO TBCR
40.0000 meq | EXTENDED_RELEASE_TABLET | Freq: Once | ORAL | Status: AC
  Administered 2023-07-30: 40 meq via ORAL
  Filled 2023-07-30: qty 2

## 2023-07-30 MED ORDER — SODIUM CHLORIDE 0.9% FLUSH
10.0000 mL | Freq: Two times a day (BID) | INTRAVENOUS | Status: DC
  Administered 2023-07-30: 40 mL
  Administered 2023-07-30: 10 mL
  Administered 2023-07-31: 40 mL
  Administered 2023-08-01 – 2023-08-02 (×3): 10 mL

## 2023-07-30 MED ORDER — HYDRALAZINE HCL 25 MG PO TABS: 25.0000 mg | ORAL_TABLET | Freq: Three times a day (TID) | ORAL | Status: DC

## 2023-07-30 MED ORDER — SODIUM CHLORIDE 0.9% FLUSH
10.0000 mL | INTRAVENOUS | Status: DC | PRN
  Administered 2023-07-30: 10 mL

## 2023-07-30 MED ORDER — ISOSORB DINITRATE-HYDRALAZINE 20-37.5 MG PO TABS
1.0000 | ORAL_TABLET | Freq: Three times a day (TID) | ORAL | Status: DC
  Administered 2023-07-30 (×2): 1 via ORAL
  Filled 2023-07-30 (×2): qty 1

## 2023-07-30 NOTE — Progress Notes (Signed)
ECG tracing for PICC confirmation with deflect noted the from the top of the SVC to low SVC.  Observed p wave elevation with significant changes increasing with maximum elevation at "0" mark.  Decrease noted at hub position.  PICC left at "0".  PICC nurses Arlina Robes, RN and Gasper Lloyd, RN at bedside during this phase of confirmation.

## 2023-07-30 NOTE — Progress Notes (Addendum)
Advanced Heart Failure Rounding Note  PCP-Cardiologist: None   Subjective:    On Milrinone 0.125 + Lasix 8. Hypertensive overnight 130-140/90s  Sitting up in bed. States that his breathing is better and abdomen is less tight. [name]  Minimal SOB.   Objective:   Weight Range: 94.1 kg Body mass index is 33.48 kg/m.   Vital Signs:   Temp:  [97.6 F (36.4 C)-99 F (37.2 C)] 97.6 F (36.4 C) (11/27 0752) Pulse Rate:  [0-72] 64 (11/27 0752) Resp:  [13-27] 18 (11/27 0752) BP: (132-156)/(84-122) 140/97 (11/27 0752) SpO2:  [81 %-100 %] 97 % (11/27 0752) Weight:  [94.1 kg] 94.1 kg (11/27 0451) Last BM Date : 07/28/23  Weight change: Filed Weights   07/28/23 1749 07/29/23 0550 07/30/23 0451  Weight: 98 kg 95.8 kg 94.1 kg    Intake/Output:   Intake/Output Summary (Last 24 hours) at 07/30/2023 0836 Last data filed at 07/30/2023 0754 Gross per 24 hour  Intake 1686.14 ml  Output 3125 ml  Net -1438.86 ml    Physical Exam    General: No resp distress on RA. HEENT: neck supple.   Cardiac: JVP to jaw. S1 and S2 present. S3 gallop. Resp: Lung sounds clear, bases diminished Abdomen: Taut, non-tender, distended. + BS. Extremities: Warm and dry. No rash, cyanosis.  1+ peripherial edema.  Neuro: Alert and oriented x3.  Moves all extremities without difficulty.  Telemetry   SB/SR 50-60. 8 beats NSVT this am (personally reviewed)  EKG    N/A  Labs    CBC Recent Labs    07/29/23 1641 07/30/23 0234  WBC 9.5 9.8  HGB 14.3 13.6  HCT 45.5 43.3  MCV 90.6 89.3  PLT 212 196   Basic Metabolic Panel Recent Labs    16/10/96 0243 07/29/23 1231 07/29/23 1641 07/30/23 0234  NA 141   < > 141 141  K 3.4*   < > 3.5 3.7  CL 105  --  105 104  CO2 28  --  27 29  GLUCOSE 115*  --  139* 131*  BUN 19  --  20 22  CREATININE 1.54*  --  1.64* 1.67*  CALCIUM 9.7  --  10.0 10.0  MG 1.9  --   --  2.2   < > = values in this interval not displayed.   Liver Function Tests No  results for input(s): "AST", "ALT", "ALKPHOS", "BILITOT", "PROT", "ALBUMIN" in the last 72 hours. No results for input(s): "LIPASE", "AMYLASE" in the last 72 hours. Cardiac Enzymes No results for input(s): "CKTOTAL", "CKMB", "CKMBINDEX", "TROPONINI" in the last 72 hours.  BNP: BNP (last 3 results) Recent Labs    01/11/23 1705 07/26/23 1825  BNP 237.1* 234.4*    ProBNP (last 3 results) No results for input(s): "PROBNP" in the last 8760 hours.   D-Dimer No results for input(s): "DDIMER" in the last 72 hours. Hemoglobin A1C No results for input(s): "HGBA1C" in the last 72 hours. Fasting Lipid Panel No results for input(s): "CHOL", "HDL", "LDLCALC", "TRIG", "CHOLHDL", "LDLDIRECT" in the last 72 hours. Thyroid Function Tests No results for input(s): "TSH", "T4TOTAL", "T3FREE", "THYROIDAB" in the last 72 hours.  Invalid input(s): "FREET3"  Other results:   Imaging    [place] EKG SITE RITE  Result Date: 07/29/2023 If Site Rite image not attached, placement could not be confirmed due to current cardiac rhythm.  CARDIAC CATHETERIZATION  Addendum Date: 07/29/2023   POST CATH FINDINGS Angiographically minimal CAD in a Right dominant  system Borderline Severe Pulmonary Hypertension-Pulmonary Venous Congestion from Combined Systolic and Diastolic Heart Failure Mean PAP 43 to 45 mmHg with PCWP of roughly 38 mmHg and LVEDP of 40 mmHg. Cardiac output-index by Fick 3.62-1.75 RECOMMENDATIONS   Hemodynamic findings consistent with severe pulmonary hypertension.   There is no aortic valve stenosis.   Anticipated discharge date to be determined.   I have given 40 mg IV Lasix in the Cath Lab and will reinitiate Lasix drip. Gentle post cath hydration with 30 mL an hour for 6 hours.   No indication for antiplatelet therapy at this time . Bryan Lemma, MD   Result Date: 07/29/2023 POST CATH FINDINGS Angiographically minimal CAD in a Right dominant system Borderline Severe Pulmonary  Hypertension-Pulmonary Venous Congestion from Combined Systolic and Diastolic Heart Failure Mean PAP 43 to 45 mmHg with PCWP of roughly 38 mmHg and LVEDP of 40 mmHg. Cardiac output-index by Fick 3.62-1.75 RECOMMENDATIONS   Hemodynamic findings consistent with severe pulmonary hypertension.   There is no aortic valve stenosis.   Anticipated discharge date to be determined.   I have given 40 mg IV Lasix in the Cath Lab and will reinitiate Lasix drip. Gentle post cath hydration with 30 mL an hour for 6 hours.   No indication for antiplatelet therapy at this time . Bryan Lemma, MD    Medications:     Scheduled Medications:  apixaban  5 mg Oral BID   aspirin  81 mg Oral Daily   atorvastatin  40 mg Oral Daily   hydrALAZINE  10 mg Oral Q8H   insulin aspart  0-6 Units Subcutaneous TID WC   isosorbide mononitrate  30 mg Oral Daily   sodium chloride flush  3 mL Intravenous Q12H   sodium chloride flush  3 mL Intravenous Q12H    Infusions:  sodium chloride     amiodarone 30 mg/hr (07/29/23 2332)   furosemide (LASIX) 200 mg in dextrose 5 % 100 mL (2 mg/mL) infusion 8 mg/hr (07/29/23 1900)   milrinone 0.125 mcg/kg/min (07/29/23 1900)    PRN Medications: sodium chloride, acetaminophen, ipratropium-albuterol, nitroGLYCERIN, sodium chloride flush  Patient Profile   Kevin Heath is a 67 y.o. male with a hx of HTN, HLD, CKD, OSA, HFimpEF, CAD, COPD. Now admitted for CHF exacerbation and atrial fibrillation (new diagnosis)  Assessment/Plan   Acute on Chronic Systolic and Diastolic Heart Failure - Echo 9/56: EF 40-45% nl RV, prolapse of posterior MV leaflet w mild MR - Echo 11/26: EF 30% gHK, G2DD, RV nl, triv MR.  - R/LHC 11/26: Minimal CAD in R dominant. mean PAP 43 to 45, PCWP 38, RA 14, CO/CI (fick) 3.62/1.75. - Lactic Acid 1.4. - Needs PICC line placed for CVP monitoring and Co-ox. - Increase Lasix gtt to 15/hr - Increase Milrinone 0.25 mcg/kg/min. Awaiting PICC and Co-ox - Start  Bidil 1 tab tid - No beta blocker with low output - Start eplerenone tomorrow if renal function stable - Consider cMRI this week once better diuresed   2. NSVT PVCs - 8 beats of NSVT this am (17 and 27 beats yesterday) - Amio gtt 30/hr while starting/on Milrinone - Mg 2.2 - K 3.7, supp   3. Afib RVR (new onset) - in there setting of continuous albuterol and Bipap and volume overload - diltiazem gtt and heparin gtt started in ED, converted to SR. Now off for bradycardia. - in NSR on tele - amio as above   4. AKI on CKD3a - cardio-renal. Last known  sCr 1.38. - sCr on admit 1.95 - Improving with diuresis 1.67 today   5. CAD - presented with chest pain/pressure. Improved today. - minimal CAD noted on R dominate system - Continue aspirin and statin   6. Pulmonary Hypertension - Who group II, ?some component of III  - Diuresis as above - On milrinone   7. HTN - 130-140/90s overnight - On milrinone - Start Bidil this afternoon   8. COPD/OSA - on CPAP  Length of Stay: 4  [place] Anwar Crill, NP  07/30/2023, 8:36 AM  Advanced Heart Failure Team Pager [phone] (M-F; 7a - 5p)  Please contact [facility] for night-coverage after hours (5p -7a ) and weekends on [email or web address]

## 2023-07-30 NOTE — Progress Notes (Signed)
Peripherally Inserted Central Catheter Placement  The IV Nurse has discussed with the patient and/or persons authorized to consent for the patient, the purpose of this procedure and the potential benefits and risks involved with this procedure.  The benefits include less needle sticks, lab draws from the catheter, and the patient may be discharged home with the catheter. Risks include, but not limited to, infection, bleeding, blood clot (thrombus formation), and puncture of an artery; nerve damage and irregular heartbeat and possibility to perform a PICC exchange if needed/ordered by physician.  Alternatives to this procedure were also discussed.  Bard Power PICC patient education guide, fact sheet on infection prevention and patient information card has been provided to patient /or left at bedside.  PICC inserted by Arlina Robes, RN  PICC Placement Documentation  PICC Double Lumen 07/30/23 Right Brachial 40 cm 0 cm (Active)  Indication for Insertion or Continuance of Line Vasoactive infusions;Chronic illness with exacerbations (CF, Sickle Cell, etc.) 07/30/23 1408  Exposed Catheter (cm) 0 cm 07/30/23 1408  Site Assessment Clean, Dry, Intact 07/30/23 1408  Lumen #1 Status Flushed;Saline locked;Blood return noted 07/30/23 1408  Lumen #2 Status Flushed;Saline locked;Blood return noted 07/30/23 1408  Dressing Type Transparent;Securing device 07/30/23 1408  Dressing Status Antimicrobial disc in place;Clean, Dry, Intact 07/30/23 1408  Line Care Connections checked and tightened 07/30/23 1408  Line Adjustment (NICU/IV Team Only) No 07/30/23 1408  Dressing Intervention New dressing;Adhesive placed at insertion site (IV team only) 07/30/23 1408  Dressing Change Due 08/06/23 07/30/23 1408       Rayah Fines, Lajean Manes 07/30/2023, 2:09 PM

## 2023-07-30 NOTE — Progress Notes (Signed)
Progress Note   Patient: Kevin Heath JXB:[id number] DOB: 13-Dec-1955 DOA: 07/26/2023     4 DOS: the patient was seen and examined on 07/30/2023   Brief hospital course: Kevin Heath was admitted to the hospital with the working diagnosis of heart failure exacerbation   67 yo male with the past medical history of heart failure, COPD, hypertension, hyperlipidemia, CKD, and OSA who presented with progressive dyspnea and chest pain. Reported 2 weeks of angina symptoms with exertional chest pain, improved with rest. Endorsed dietary indiscretions and worsening lower extremity edema.  On his initial physical examination his blood pressure was 113/74, HR 73, RR 32 and 02 saturation 96%, lungs with rales bilaterally and increased work of breathing, heart with S1 and S2 present and tachycardic, irregularly irregular, abdomen with no distention and positive lower extremity edema.   Na 140, K 3,2 Cl 104 bicarbonate 25, glucose 149, bun 15 cr 1,95  AST 43 and ALT 53  BNP 234  High sensitive troponin 20, 23 and 49  Wbc 10,7 hgb 13,9 plt 200  Sars covid 19 negative  Urine analysis SG 1,009, protein 30, negative leukocytes and negative Hgb.  Toxicology screen negative   Chest radiograph with left rotation with bilateral hilar vascular congestion, bilateral central interstitial infiltrates, with right base atelectasis.   EKG 171 bpm, normal axis, normal intervals, atrial fibrillation rhythm with poor R R wave progression with no significant ST segment or T wave changes.   Patient was placed on IV heparin for anticoagulation and IV diltiazem for rate control.  IV furosemide for diuresis.   11/25 improvement in volume status, plan for coronary angiography tomorrow.  11/26 cardiac catheterization with low cardiac output.  Consulted advance heart failure, patient may need inotropic support.  11/27 patient on milrinone, amiodarone and furosemide drips with good toleration.   Assessment and Plan: *  Acute on chronic diastolic CHF (congestive heart failure) (HCC) Echocardiogram with reduced LV systolic function with EF 30%, global hypokinesis, RV systolic function preserved, LA with mild dilatation, no pericardial effusion,   11/26 cardiac catheterization  RA 16 RV 50/6  PA 58/38 mean 43-45 PCWP 38 -35  Cardiac output 3,6 and index 1,72 (Fick).   Urine output is 3000 ml  Systolic blood pressure 130 to 140 mmHg.   Pending placement on PICC line Plan to continue with milrinone 0,25 mcg/ kg/ hr.  After load reduction with hydralazine and isosorbide.  Furosemide drip 15 mg/ hr.  Atrial fibrillation with rapid ventricular response (HCC) Patient on sinus rhythm.  Telemetry with NSVT and PVC, rate in the 50's     Continue amiodarone drip   Anticoagulation with apixaban.   CAD (coronary artery disease) Coronary angiography with minimal coronary artery disease in a right dominant system.  Continue statin therapy, because patient on apixaban will dc aspirin to decrease risk of bleeding.   Unstable angina now has been ruled out.   Acute kidney injury superimposed on chronic kidney disease (HCC) CKD stage 3 a. Hypokalemia.   Renal function with serum cr at 1,67 with K at 3,7 and serum bicarbonate at 29  Na 141 Mg 2.2    Plan to continue diuresis with furosemide.  K correction with Kcl 40 meq.  Follow up renal function in am.   Obesity, class 2 Calculated BMI is 35.1 Pre diabetes continue insulin sliding scale to cover hyperglycemia..         Subjective: Patient with feeling better with improved dyspnea and abdominal wall distention, no  chest pain,   Physical Exam: Vitals:   07/30/23 0044 07/30/23 0451 07/30/23 0752 07/30/23 1052  BP: (!) 132/90 (!) 134/93 (!) 140/97   Pulse: (!) 55 (!) 59 64   Resp: 15 18 18 20   Temp: 97.7 F (36.5 C) 97.8 F (36.6 C) 97.6 F (36.4 C)   TempSrc: Oral Oral Oral Oral  SpO2: 98% 97% 97%   Weight:  94.1 kg    Height:        Neurology awake and alert, deconditioned ENT with mild pallor Cardiovascular with S1 and S2 present and regular with no gallops, rubs or murmurs No JVD No lower extremity edema Respiratory with no rales or wheezing,  Abdomen with no distention  Data Reviewed:    Family Communication: family at the bedside   Disposition: Status is: Inpatient Remains inpatient appropriate because: IV amiodarone, milrinone and furosemide   Planned Discharge Destination: Home    Author: Coralie Keens, MD 07/30/2023 11:41 AM  For on call review [email or web address].

## 2023-07-30 NOTE — Progress Notes (Signed)
Heart Failure Navigator Progress Note  Assessed for Heart & Vascular TOC clinic readiness.  Patient does not meet criteria due to Advanced Heart Failure Team consulted. .   Navigator will sign off at this time.   Rhae Hammock, BSN, Scientist, clinical (histocompatibility and immunogenetics) Only

## 2023-07-30 NOTE — Progress Notes (Signed)
Progress Note  Patient Name: DEMITRIS NIHILL Date of Encounter: 07/30/2023  Primary Cardiologist: None   Subjective   Patient seen examined his bedside.  Breathing continues to improve. Status post right and left heart cath  Inpatient Medications    Scheduled Meds:  apixaban  5 mg Oral BID   aspirin  81 mg Oral Daily   atorvastatin  40 mg Oral Daily   hydrALAZINE  25 mg Oral Q8H   insulin aspart  0-6 Units Subcutaneous TID WC   isosorbide mononitrate  30 mg Oral Daily   potassium chloride  40 mEq Oral Once   sodium chloride flush  3 mL Intravenous Q12H   sodium chloride flush  3 mL Intravenous Q12H   Continuous Infusions:  sodium chloride     amiodarone 30 mg/hr (07/29/23 2332)   furosemide (LASIX) 200 mg in dextrose 5 % 100 mL (2 mg/mL) infusion 8 mg/hr (07/29/23 1900)   milrinone 0.125 mcg/kg/min (07/29/23 1900)   PRN Meds: sodium chloride, acetaminophen, ipratropium-albuterol, nitroGLYCERIN, sodium chloride flush   Vital Signs    Vitals:   07/29/23 2027 07/30/23 0044 07/30/23 0451 07/30/23 0752  BP: (!) 138/95 (!) 132/90 (!) 134/93 (!) 140/97  Pulse: 67 (!) 55 (!) 59 64  Resp: 17 15 18 18   Temp: 99 F (37.2 C) 97.7 F (36.5 C) 97.8 F (36.6 C) 97.6 F (36.4 C)  TempSrc: Oral Oral Oral Oral  SpO2: 100% 98% 97% 97%  Weight:   94.1 kg   Height:        Intake/Output Summary (Last 24 hours) at 07/30/2023 0947 Last data filed at 07/30/2023 0754 Gross per 24 hour  Intake 1686.14 ml  Output 3125 ml  Net -1438.86 ml   Filed Weights   07/28/23 1749 07/29/23 0550 07/30/23 0451  Weight: 98 kg 95.8 kg 94.1 kg    Telemetry    Sinus rhythm- Personally Reviewed  ECG     - Personally Reviewed  Physical Exam     General: Comfortable, Head: Atraumatic, normal size  Eyes: PEERLA, EOMI  Neck: Supple, normal JVD Cardiac: Normal S1, S2; RRR; no murmurs, rubs, or gallops Lungs: Clear to auscultation bilaterally Abd: Soft, nontender, no hepatomegaly   Ext: warm, no edema Musculoskeletal: No deformities, BUE and BLE strength normal and equal Skin: Warm and dry, no rashes   Neuro: Alert and oriented to person, place, time, and situation, CNII-XII grossly intact, no focal deficits  Psych: Normal mood and affect   Labs    Chemistry Recent Labs  Lab 07/26/23 1825 07/26/23 1832 07/29/23 0243 07/29/23 1231 07/29/23 1248 07/29/23 1641 07/30/23 0234  NA 140   < > 141   < > 143 141 141  K 3.2*   < > 3.4*   < > 3.5 3.5 3.7  CL 104   < > 105  --   --  105 104  CO2 25   < > 28  --   --  27 29  GLUCOSE 149*   < > 115*  --   --  139* 131*  BUN 15   < > 19  --   --  20 22  CREATININE 1.95*   < > 1.54*  --   --  1.64* 1.67*  CALCIUM 9.5   < > 9.7  --   --  10.0 10.0  PROT 6.6  --   --   --   --   --   --   ALBUMIN  3.7  --   --   --   --   --   --   AST 43*  --   --   --   --   --   --   ALT 53*  --   --   --   --   --   --   ALKPHOS 84  --   --   --   --   --   --   BILITOT 0.7  --   --   --   --   --   --   GFRNONAA 37*   < > 49*  --   --  46* 45*  ANIONGAP 11   < > 8  --   --  9 8   < > = values in this interval not displayed.     Hematology Recent Labs  Lab 07/29/23 0243 07/29/23 1231 07/29/23 1248 07/29/23 1641 07/30/23 0234  WBC 12.0*  --   --  9.5 9.8  RBC 4.52  --   --  5.02 4.85  HGB 13.0   < > 14.3 14.3 13.6  HCT 41.3   < > 42.0 45.5 43.3  MCV 91.4  --   --  90.6 89.3  MCH 28.8  --   --  28.5 28.0  MCHC 31.5  --   --  31.4 31.4  RDW 13.2  --   --  13.2 13.0  PLT 202  --   --  212 196   < > = values in this interval not displayed.    Cardiac EnzymesNo results for input(s): "TROPONINI" in the last 168 hours. No results for input(s): "TROPIPOC" in the last 168 hours.   BNP Recent Labs  Lab 07/26/23 1825  BNP 234.4*     DDimer No results for input(s): "DDIMER" in the last 168 hours.   Radiology    [place] EKG SITE RITE  Result Date: 07/29/2023 If Site Rite image not attached, placement could not be  confirmed due to current cardiac rhythm.  CARDIAC CATHETERIZATION  Addendum Date: 07/29/2023   POST CATH FINDINGS Angiographically minimal CAD in a Right dominant system Borderline Severe Pulmonary Hypertension-Pulmonary Venous Congestion from Combined Systolic and Diastolic Heart Failure Mean PAP 43 to 45 mmHg with PCWP of roughly 38 mmHg and LVEDP of 40 mmHg. Cardiac output-index by Fick 3.62-1.75 RECOMMENDATIONS   Hemodynamic findings consistent with severe pulmonary hypertension.   There is no aortic valve stenosis.   Anticipated discharge date to be determined.   I have given 40 mg IV Lasix in the Cath Lab and will reinitiate Lasix drip. Gentle post cath hydration with 30 mL an hour for 6 hours.   No indication for antiplatelet therapy at this time . Bryan Lemma, MD   Result Date: 07/29/2023 POST CATH FINDINGS Angiographically minimal CAD in a Right dominant system Borderline Severe Pulmonary Hypertension-Pulmonary Venous Congestion from Combined Systolic and Diastolic Heart Failure Mean PAP 43 to 45 mmHg with PCWP of roughly 38 mmHg and LVEDP of 40 mmHg. Cardiac output-index by Fick 3.62-1.75 RECOMMENDATIONS   Hemodynamic findings consistent with severe pulmonary hypertension.   There is no aortic valve stenosis.   Anticipated discharge date to be determined.   I have given 40 mg IV Lasix in the Cath Lab and will reinitiate Lasix drip. Gentle post cath hydration with 30 mL an hour for 6 hours.   No indication for antiplatelet therapy at this time . Bryan Lemma,  MD  ECHOCARDIOGRAM COMPLETE  Result Date: 07/28/2023    ECHOCARDIOGRAM REPORT   Patient Name:   SREERAM CORSINO Date of Exam: 07/28/2023 Medical Rec #:  [id number]        Height:       66.0 in Accession #:    [id number]       Weight:       215.4 lb Date of Birth:  [birth year]        BSA:          2.064 m Patient Age:    67 years         BP:           132/99 mmHg Patient Gender: M                HR:           67 bpm. Exam Location:   Inpatient Procedure: 2D Echo, Cardiac Doppler, Color Doppler and Intracardiac            Opacification Agent Indications:    CHF  History:        Patient has no prior history of Echocardiogram examinations.                 CHF, Arrythmias:Atrial Fibrillation, Signs/Symptoms:Chest Pain;                 Risk Factors:Hypertension.  Sonographer:    Vern Claude Referring Phys: Dolly Rias IMPRESSIONS  1. Left ventricular ejection fraction, by estimation, is 30%. The left ventricle has moderate to severely decreased function. The left ventricle demonstrates global hypokinesis. The left ventricular internal cavity size was mildly dilated. Left ventricular diastolic parameters are consistent with Grade II diastolic dysfunction (pseudonormalization).  2. Right ventricular systolic function is normal. The right ventricular size is normal. Tricuspid regurgitation signal is inadequate for assessing PA pressure.  3. Left atrial size was mildly dilated.  4. The mitral valve is normal in structure. Trivial mitral valve regurgitation. No evidence of mitral stenosis.  5. The aortic valve is tricuspid. Aortic valve regurgitation is not visualized. No aortic stenosis is present.  6. Aortic dilatation noted. There is mild dilatation of the ascending aorta, measuring 41 mm.  7. The inferior vena cava is dilated in size with <50% respiratory variability, suggesting right atrial pressure of 15 mmHg. FINDINGS  Left Ventricle: Left ventricular ejection fraction, by estimation, is 30%. The left ventricle has moderate to severely decreased function. The left ventricle demonstrates global hypokinesis. The left ventricular internal cavity size was mildly dilated. There is no left ventricular hypertrophy. Left ventricular diastolic parameters are consistent with Grade II diastolic dysfunction (pseudonormalization). Right Ventricle: The right ventricular size is normal. No increase in right ventricular wall thickness. Right ventricular  systolic function is normal. Tricuspid regurgitation signal is inadequate for assessing PA pressure. Left Atrium: Left atrial size was mildly dilated. Right Atrium: Right atrial size was normal in size. Pericardium: There is no evidence of pericardial effusion. Mitral Valve: The mitral valve is normal in structure. Trivial mitral valve regurgitation. No evidence of mitral valve stenosis. MV peak gradient, 4.7 mmHg. The mean mitral valve gradient is 1.0 mmHg. Tricuspid Valve: The tricuspid valve is normal in structure. Tricuspid valve regurgitation is not demonstrated. Aortic Valve: The aortic valve is tricuspid. Aortic valve regurgitation is not visualized. No aortic stenosis is present. Aortic valve mean gradient measures 2.0 mmHg. Aortic valve peak gradient measures 3.7 mmHg. Aortic valve area, by VTI measures 2.01 cm. Pulmonic Valve:  The pulmonic valve was normal in structure. Pulmonic valve regurgitation is trivial. Aorta: The aortic root is normal in size and structure and aortic dilatation noted. There is mild dilatation of the ascending aorta, measuring 41 mm. Venous: The inferior vena cava is dilated in size with less than 50% respiratory variability, suggesting right atrial pressure of 15 mmHg. IAS/Shunts: No atrial level shunt detected by color flow Doppler.  LEFT VENTRICLE PLAX 2D LVIDd:         5.80 cm LVIDs:         4.40 cm LV PW:         0.90 cm LV IVS:        0.70 cm LVOT diam:     2.10 cm LV SV:         33 LV SV Index:   16 LVOT Area:     3.46 cm  LV Volumes (MOD) LV vol d, MOD A4C: 149.0 ml LV vol s, MOD A4C: 94.0 ml LV SV MOD A4C:     149.0 ml RIGHT VENTRICLE             IVC RV Basal diam:  3.90 cm     IVC diam: 2.20 cm RV Mid diam:    2.90 cm RV S prime:     13.10 cm/s TAPSE (M-mode): 2.5 cm LEFT ATRIUM              Index        RIGHT ATRIUM           Index LA Vol (A2C):   108.0 ml 52.33 ml/m  RA Area:     14.60 cm LA Vol (A4C):   55.1 ml  26.70 ml/m  RA Volume:   35.90 ml  17.39 ml/m LA  Biplane Vol: 79.0 ml  38.28 ml/m  AORTIC VALVE                    PULMONIC VALVE AV Area (Vmax):    2.19 cm     PV Vmax:          0.54 m/s AV Area (Vmean):   1.72 cm     PV Peak grad:     1.1 mmHg AV Area (VTI):     2.01 cm     PR End Diast Vel: 6.25 msec AV Vmax:           96.10 cm/s AV Vmean:          68.000 cm/s AV VTI:            0.164 m AV Peak Grad:      3.7 mmHg AV Mean Grad:      2.0 mmHg LVOT Vmax:         60.70 cm/s LVOT Vmean:        33.800 cm/s LVOT VTI:          0.095 m LVOT/AV VTI ratio: 0.58  AORTA Ao Root diam: 3.50 cm Ao Asc diam:  4.07 cm MITRAL VALVE MV Area (PHT): 5.75 cm    SHUNTS MV Area VTI:   1.34 cm    Systemic VTI:  0.10 m MV Peak grad:  4.7 mmHg    Systemic Diam: 2.10 cm MV Mean grad:  1.0 mmHg MV Vmax:       1.08 m/s MV Vmean:      42.6 cm/s MV Decel Time: 132 msec MV E velocity: 94.30 cm/s Dalton McleanMD Electronically signed by Wilfred Lacy Signature Date/Time: 07/28/2023/11:15:43  AM    Final     Cardiac Studies   Echo pending  Patient Profile     67 y.o. male with heart failure and improved ejection fraction new onset atrial fibrillation and unstable angina  Assessment & Plan    Acute on Chronic HFrEF NICM  Unstable angina - no CAD on LHC New onset atrial fibrillation  NSVT   Cardiac cath yesterday, no CAD but elevated LVEDP in the setting of his hear failure with reduced EF. - we have consulted our heart failure.  He has been started on milrinone - agree with this. Continue the lasix gtt.  Some NSVT on tele - on amio gtt.   No CAD on LHC - stop aspirin   HLN - cont lipitor   He is back in sinus rhythm, chadsvasc score is 73 (age greater than 78, hypertension, history of CHF)-post catheterization he will need to be transition to oral anticoagulant like Eliquis 5 mg twice daily.  Has allergies to lisinopril which is swelling need to understand to make sure this is not angioedema which would complicate optimizing his GDMT, Also is allergic to  aldactone.   Aortic dilatation - need outpatient following for repeat imaging      For questions or updates, please contact [facility] Please consult [email or web address] for contact info under Cardiology/STEMI.      Osvaldo Shipper, DO  07/30/2023, 9:47 AM

## 2023-07-30 NOTE — Plan of Care (Signed)
Problem: Education: Goal: Knowledge of General Education information will improve Description: Including pain rating scale, medication(s)/side effects and non-pharmacologic comfort measures Outcome: Progressing   Problem: Health Behavior/Discharge Planning: Goal: Ability to manage health-related needs will improve Outcome: Progressing   Problem: Clinical Measurements: Goal: Ability to maintain clinical measurements within normal limits will improve Outcome: Progressing   Problem: Clinical Measurements: Goal: Diagnostic test results will improve Outcome: Progressing   Problem: Clinical Measurements: Goal: Respiratory complications will improve Outcome: Progressing   Problem: Clinical Measurements: Goal: Cardiovascular complication will be avoided Outcome: Progressing

## 2023-07-30 NOTE — TOC Initial Note (Signed)
Transition of Care [facility]) - Initial/Assessment Note    Patient Details  Name: Kevin Heath MRN: [id number] Date of Birth: March 10, 1956  Transition [facility]) CM/SW Contact:    Nicanor Bake Phone Number: [phone] 07/30/2023, 1:01 PM  Clinical Narrative:   [facility] CSW met with pt at bedside. Pt stated that he lives at home alone. 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 Pt stated that he has no history of HH services. Pt stated that he uses a CPAP machine.  CSW explained that his hospital follow up PCP appt. Will be scheduled closer to dc. Pt agrees. Pt stated that he is leaving [place] and transitioning to [place], [place].  CSW called the the [employer] and notified them of the pt who is a vet hospitalization.  Notification id: [id number].     TOC will continue following.                Expected Discharge Plan: Home/Self Care Barriers to Discharge: Continued Medical Work up   Patient Goals and CMS Choice Patient states their goals for this hospitalization and ongoing recovery are:: return home   Choice offered to / list presented to : NA      Expected Discharge Plan and Services In-house Referral: NA Discharge Planning Services: CM Consult Post Acute Care Choice: NA Living arrangements for the past 2 months: Apartment                 DME Arranged: N/A DME Agency: NA       HH Arranged: NA          Prior Living Arrangements/Services Living arrangements for the past 2 months: Apartment Lives with:: Self Patient language and need for interpreter reviewed:: Yes Do you feel safe going back to the place where you live?: Yes      Need for Family Participation in Patient Care: Yes (Comment) Care giver support system in place?: Yes (comment) Current home services: DME (cpap) Criminal Activity/Legal Involvement Pertinent to Current Situation/Hospitalization: No - Comment as needed  Activities of Daily Living   ADL Screening  (condition at time of admission) Independently performs ADLs?: Yes (appropriate for developmental age) Is the patient deaf or have difficulty hearing?: No Does the patient have difficulty seeing, even when wearing glasses/contacts?: No Does the patient have difficulty concentrating, remembering, or making decisions?: No  Permission Sought/Granted Permission sought to share information with : Case Manager Permission granted to share information with : Yes, Verbal Permission Granted              Emotional Assessment Appearance:: Appears stated age Attitude/Demeanor/Rapport: Engaged Affect (typically observed): Appropriate Orientation: : Oriented to Self, Oriented to Place, Oriented to  Time, Oriented to Situation Alcohol / Substance Use: Not Applicable Psych Involvement: No (comment)  Admission diagnosis:  COPD exacerbation (HCC) [J44.1] Acute exacerbation of CHF (congestive heart failure) (HCC) [I50.9] Patient Active Problem List   Diagnosis Date Noted   Chest pain 07/27/2023   Atrial fibrillation with rapid ventricular response (HCC) 07/27/2023   Respiratory distress 07/27/2023   Acute kidney injury superimposed on chronic kidney disease (HCC) 07/27/2023   CAD (coronary artery disease) 07/27/2023   Obesity, class 2 07/27/2023   Acute on chronic diastolic CHF (congestive heart failure) (HCC) 07/26/2023   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 [place] [address] Phone: [phone] Fax: [phone]     Social Determinants of Health (SDOH) Social History: SDOH Screenings   Food Insecurity: No Food Insecurity (07/26/2023)  Housing: Low Risk  (07/26/2023)  Transportation Needs: No Transportation Needs (07/26/2023)  Utilities: Not At Risk  (07/26/2023)  Social Connections: Unknown (04/29/2023)   Received from [facility]   SDOH Interventions:     Readmission Risk Interventions     No data to display

## 2023-07-31 DIAGNOSIS — I5021 Acute systolic (congestive) heart failure: Secondary | ICD-10-CM

## 2023-07-31 DIAGNOSIS — I4891 Unspecified atrial fibrillation: Secondary | ICD-10-CM | POA: Diagnosis not present

## 2023-07-31 DIAGNOSIS — E876 Hypokalemia: Secondary | ICD-10-CM | POA: Diagnosis not present

## 2023-07-31 DIAGNOSIS — I5033 Acute on chronic diastolic (congestive) heart failure: Secondary | ICD-10-CM | POA: Diagnosis not present

## 2023-07-31 DIAGNOSIS — I2510 Atherosclerotic heart disease of native coronary artery without angina pectoris: Secondary | ICD-10-CM | POA: Diagnosis not present

## 2023-07-31 DIAGNOSIS — N179 Acute kidney failure, unspecified: Secondary | ICD-10-CM | POA: Diagnosis not present

## 2023-07-31 DIAGNOSIS — R570 Cardiogenic shock: Secondary | ICD-10-CM

## 2023-07-31 LAB — COOXEMETRY PANEL
Carboxyhemoglobin: 1.7 % — ABNORMAL HIGH (ref 0.5–1.5)
Methemoglobin: 0.7 % (ref 0.0–1.5)
O2 Saturation: 66.3 %
Total hemoglobin: 14 g/dL (ref 12.0–16.0)

## 2023-07-31 LAB — GLUCOSE, CAPILLARY
Glucose-Capillary: 134 mg/dL — ABNORMAL HIGH (ref 70–99)
Glucose-Capillary: 104 mg/dL — ABNORMAL HIGH (ref 70–99)
Glucose-Capillary: 112 mg/dL — ABNORMAL HIGH (ref 70–99)
Glucose-Capillary: 149 mg/dL — ABNORMAL HIGH (ref 70–99)

## 2023-07-31 LAB — LIPOPROTEIN A (LPA): Lipoprotein (a): 177 nmol/L — ABNORMAL HIGH (ref <75.0)

## 2023-07-31 LAB — BASIC METABOLIC PANEL
Anion gap: 7 (ref 5–15)
Anion gap: 7 (ref 5–15)
BUN: 25 mg/dL — ABNORMAL HIGH (ref 8–23)
BUN: 26 mg/dL — ABNORMAL HIGH (ref 8–23)
CO2: 28 mmol/L (ref 22–32)
CO2: 29 mmol/L (ref 22–32)
Calcium: 9.1 mg/dL (ref 8.9–10.3)
Calcium: 10.3 mg/dL (ref 8.9–10.3)
Chloride: 98 mmol/L (ref 98–111)
Chloride: 103 mmol/L (ref 98–111)
Creatinine, Ser: 1.8 mg/dL — ABNORMAL HIGH (ref 0.61–1.24)
Creatinine, Ser: 2 mg/dL — ABNORMAL HIGH (ref 0.61–1.24)
GFR, Estimated: 41 mL/min — ABNORMAL LOW (ref >60)
GFR, Estimated: 36 mL/min — ABNORMAL LOW (ref >60)
Glucose, Bld: 343 mg/dL — ABNORMAL HIGH (ref 70–99)
Glucose, Bld: 125 mg/dL — ABNORMAL HIGH (ref 70–99)
Potassium: 2.9 mmol/L — ABNORMAL LOW (ref 3.5–5.1)
Potassium: 3.7 mmol/L (ref 3.5–5.1)
Sodium: 133 mmol/L — ABNORMAL LOW (ref 135–145)
Sodium: 139 mmol/L (ref 135–145)

## 2023-07-31 LAB — MAGNESIUM: Magnesium: 1.9 mg/dL (ref 1.7–2.4)

## 2023-07-31 MED ORDER — POTASSIUM CHLORIDE CRYS ER 20 MEQ PO TBCR
40.0000 meq | EXTENDED_RELEASE_TABLET | Freq: Once | ORAL | Status: AC
  Administered 2023-07-31: 40 meq via ORAL
  Filled 2023-07-31: qty 2

## 2023-07-31 MED ORDER — AMIODARONE HCL 200 MG PO TABS
400.0000 mg | ORAL_TABLET | Freq: Two times a day (BID) | ORAL | Status: DC
  Administered 2023-07-31 – 2023-08-02 (×5): 400 mg via ORAL
  Filled 2023-07-31 (×5): qty 2

## 2023-07-31 MED ORDER — POTASSIUM CHLORIDE CRYS ER 20 MEQ PO TBCR
40.0000 meq | EXTENDED_RELEASE_TABLET | ORAL | Status: AC
  Administered 2023-07-31 (×2): 40 meq via ORAL
  Filled 2023-07-31 (×2): qty 2

## 2023-07-31 MED ORDER — AMIODARONE HCL 200 MG PO TABS: 200.0000 mg | ORAL_TABLET | Freq: Every day | ORAL | Status: DC

## 2023-07-31 MED ORDER — FUROSEMIDE 10 MG/ML IJ SOLN
8.0000 mg/h | INTRAVENOUS | Status: DC
  Administered 2023-07-31: 8 mg/h via INTRAVENOUS
  Filled 2023-07-31: qty 20

## 2023-07-31 NOTE — Progress Notes (Signed)
Placed patient on CPAP for the night via auto-mode with oxygen set at 2lpm

## 2023-07-31 NOTE — Plan of Care (Signed)

## 2023-07-31 NOTE — Progress Notes (Signed)
Progress Note   Patient: Kevin Heath VWU:[id number] DOB: 1956-03-11 DOA: 07/26/2023     5 DOS: the patient was seen and examined on 07/31/2023   Brief hospital course: Mr. Glessner was admitted to the hospital with the working diagnosis of heart failure exacerbation   67 yo male with the past medical history of heart failure, COPD, hypertension, hyperlipidemia, CKD, and OSA who presented with progressive dyspnea and chest pain. Reported 2 weeks of angina symptoms with exertional chest pain, improved with rest. Endorsed dietary indiscretions and worsening lower extremity edema.  On his initial physical examination his blood pressure was 113/74, HR 73, RR 32 and 02 saturation 96%, lungs with rales bilaterally and increased work of breathing, heart with S1 and S2 present and tachycardic, irregularly irregular, abdomen with no distention and positive lower extremity edema.   Na 140, K 3,2 Cl 104 bicarbonate 25, glucose 149, bun 15 cr 1,95  AST 43 and ALT 53  BNP 234  High sensitive troponin 20, 23 and 49  Wbc 10,7 hgb 13,9 plt 200  Sars covid 19 negative  Urine analysis SG 1,009, protein 30, negative leukocytes and negative Hgb.  Toxicology screen negative   Chest radiograph with left rotation with bilateral hilar vascular congestion, bilateral central interstitial infiltrates, with right base atelectasis.   EKG 171 bpm, normal axis, normal intervals, atrial fibrillation rhythm with poor R R wave progression with no significant ST segment or T wave changes.   Patient was placed on IV heparin for anticoagulation and IV diltiazem for rate control.  IV furosemide for diuresis.   11/25 improvement in volume status, plan for coronary angiography tomorrow.  11/26 cardiac catheterization with low cardiac output.  Consulted advance heart failure, patient may need inotropic support.  11/27 patient on milrinone, amiodarone and furosemide drips with good toleration.  11/28 improved hemodynamics  with inotropic support.   Assessment and Plan: * Acute on chronic diastolic CHF (congestive heart failure) (HCC) Echocardiogram with reduced LV systolic function with EF 30%, global hypokinesis, RV systolic function preserved, LA with mild dilatation, no pericardial effusion,   11/26 cardiac catheterization  RA 16 RV 50/6  PA 58/38 mean 43-45 PCWP 38 -35  Cardiac output 3,6 and index 1,72 (Fick).   Urine output is 2,915 ml  Systolic blood pressure 120 to 117 mmHg.  SV02 66.3   Plan to continue with milrinone 0,25 mcg/ kg/ hr.  After load reduction with hydralazine and isosorbide.  Transition to bolus furosemide or decrease furosemide drip.   Atrial fibrillation with rapid ventricular response [facility]) Telemetry patient continue with sinus rhythm with occasional PVC (triplet).  Rate in the 60 bpm.      Transition from IV to po amiodarone per protocol.   Anticoagulation with apixaban.   CAD (coronary artery disease) Coronary angiography with minimal coronary artery disease in a right dominant system.  Continue statin therapy, because patient on apixaban will hold on antiplatelet therapy to decrease risk of bleeding.   Unstable angina now has been ruled out.   Acute kidney injury superimposed on chronic kidney disease (HCC) CKD stage 3 a. Hypokalemia.   Renal function with serum cr at 1,80 with K at 2,9 and serum bicarbonate at 28  K is 2,9 and serum bicarbonate at 28   Plan for Kcl 40 meq x 3 doses this am. Follow up BMP this pm 15:00 Continue diuresis with furosemide. Follow up renal function and electrolytes in am.    Obesity, class 2 Calculated BMI is  35.1 Pre diabetes continue insulin sliding scale to cover hyperglycemia..         Subjective: Patient with improvement in dyspnea, and abdominal distention. No chest pain.   Physical Exam: Vitals:   07/31/23 0433 07/31/23 0523 07/31/23 0732 07/31/23 1144  BP: 126/86  (!) 147/92 117/75  Pulse: (!) 55  (!) 56 62   Resp: 18 20 17  (!) 22  Temp: 98 F (36.7 C)  98.3 F (36.8 C) 98 F (36.7 C)  TempSrc: Oral  Oral Oral  SpO2: 96%  99% 95%  Weight:  93.7 kg    Height:       Neurology awake and alert ENT with mild pallor Cardiovascular with S1 and S2 present and regular with no gallops, rubs or murmurs Mild JVD No lower extremity edema Respiratory with no rales or wheezing, no rhonchi Abdomen with no distention  Data Reviewed:    Family Communication: no family at the bedside   Disposition: Status is: Inpatient Remains inpatient appropriate because: inotropic support   Planned Discharge Destination: Home      Author: Coralie Keens, MD 07/31/2023 1:44 PM  For on call review [email or web address].

## 2023-07-31 NOTE — Progress Notes (Signed)
TRH night cross cover note:   I was notified by RN of the patient's potassium level of 2.9 this morning.  I subsequently ordered potassium chloride 40 meq po x 1 dose now.      Newton Pigg, DO Hospitalist

## 2023-07-31 NOTE — Progress Notes (Addendum)
Cardiology Progress Note  Patient ID: Kevin Heath MRN: [id number] DOB: 1956/06/18 Date of Encounter: 07/31/2023 Primary Cardiologist: None  Subjective   Chief Complaint: SOB  HPI: Potassium 2.9.  Lasix drip held.  Potassium must be replaced prior to restarting.  Reports she is short of breath.  ROS:  All other ROS reviewed and negative. Pertinent positives noted in the HPI.     Telemetry  Overnight telemetry shows sinus bradycardia 50 to 60 bpm, which I personally reviewed.    Physical Exam   Vitals:   07/30/23 2005 07/30/23 2351 07/31/23 0433 07/31/23 0523  BP: (!) 142/89 106/75 126/86   Pulse: 64 62 (!) 55   Resp: 18 20 18 20   Temp: 98.9 F (37.2 C) 97.9 F (36.6 C) 98 F (36.7 C)   TempSrc: Oral Oral Oral   SpO2: 97% 95% 96%   Weight:    93.7 kg  Height:        Intake/Output Summary (Last 24 hours) at 07/31/2023 0714 Last data filed at 07/31/2023 5784 Gross per 24 hour  Intake 1028.59 ml  Output 1965 ml  Net -936.41 ml       07/31/2023    5:23 AM 07/30/2023    4:51 AM 07/29/2023    5:50 AM  Last 3 Weights  Weight (lbs) 206 lb 9.1 oz 207 lb 7.3 oz 211 lb 3.2 oz  Weight (kg) 93.7 kg 94.1 kg 95.8 kg    Body mass index is 33.34 kg/m.  General: Well nourished, well developed, in no acute distress Head: Atraumatic, normal size  Eyes: PEERLA, EOMI  Neck: Supple, JVD 10 to 12 cm water Endocrine: No thryomegaly Cardiac: Normal S1, S2; RRR; no murmurs, rubs, or gallops Lungs: Crackles at the lung bases Abd: Soft, nontender, no hepatomegaly  Ext: Trace edema, pulses 2+ Musculoskeletal: No deformities, BUE and BLE strength normal and equal Skin: Warm and dry, no rashes   Neuro: Alert and oriented to person, place, time, and situation, CNII-XII grossly intact, no focal deficits  Psych: Normal mood and affect   Cardiac Studies  RHC/LHC 07/29/2023 POST CATH FINDINGS Angiographically minimal CAD in a Right dominant system Borderline Severe Pulmonary  Hypertension-Pulmonary Venous Congestion from Combined Systolic and Diastolic Heart Failure Mean PAP 43 to 45 mmHg with PCWP of roughly 38 mmHg and LVEDP of 40 mmHg. Cardiac output-index by Fick 3.62-1.75  TTE 07/28/2023  1. Left ventricular ejection fraction, by estimation, is 30%. The left  ventricle has moderate to severely decreased function. The left ventricle  demonstrates global hypokinesis. The left ventricular internal cavity size  was mildly dilated. Left  ventricular diastolic parameters are consistent with Grade II diastolic  dysfunction (pseudonormalization).   2. Right ventricular systolic function is normal. The right ventricular  size is normal. Tricuspid regurgitation signal is inadequate for assessing  PA pressure.   3. Left atrial size was mildly dilated.   4. The mitral valve is normal in structure. Trivial mitral valve  regurgitation. No evidence of mitral stenosis.   5. The aortic valve is tricuspid. Aortic valve regurgitation is not  visualized. No aortic stenosis is present.   6. Aortic dilatation noted. There is mild dilatation of the ascending  aorta, measuring 41 mm.   7. The inferior vena cava is dilated in size with <50% respiratory  variability, suggesting right atrial pressure of 15 mmHg.   Patient Profile  KIMBALL GALATI is a 67 y.o. male with systolic heart failure, hypertension, hyperlipidemia, COPD minimal CAD, CKD  stage IIIa admitted on 07/26/2023 with acute on chronic systolic heart failure and atrial fibrillation with RVR.  Course complicated by cardiogenic shock.  Now on milrinone.  Assessment & Plan   # Acute on chronic systolic heart failure, EF 30% # Nonischemic cardiomyopathy # Cardiogenic shock # CKD stage IIIa with AKI # Hypokalemia -Remains grossly volume overloaded.  Potassium is severely depleted.  Would hold Lasix drip for now.  Have ordered 40 mill equivalents of potassium every 4 hours.  Will recheck BMP at 1500.  As long as this  is stable can restart Lasix drip. -Kidney function is stable.  Allergy to Aldactone.  We do not have eplerenone on formulary.  Would help with potassium but for now we will hold this.  Could consider short course of Aldactone just to replete potassium and optimize GDMT. -Continue milrinone.  Co. ox stable.  Advanced heart failure is following. -No beta-blocker as he is in cardiogenic shock.  Continue BiDil 20-37.5 mg 3 times daily for afterload reduction -Continue to optimize GDMT.  Needs further diuresis.  Consider cardiac MRI.  This is a nonischemic cardiomyopathy.  # Atrial fibrillation -Secondary to heart failure. -Converted to sinus rhythm during this hospitalization.  Maintaining sinus rhythm. -Rhythm control strategy recommended.  Has been stable on amiodarone drip.  Transition to 400 mg amiodarone twice daily for 5 more days.  Then 200 mg daily.  Outpatient consideration for cardiac ablation. -Continue Eliquis 5 mg twice daily.  # Minimal CAD -Continue statin.  # CKD stage IIIa # AKI -Slight rise in creatinine likely due to diuresis.  And heart failure.  Continue above treatment.      For questions or updates, please contact [facility] Please consult [email or web address] for contact info under     Signed, Gerri Spore T. Flora Lipps, MD, [facility]  07/31/2023 7:14 AM

## 2023-08-01 ENCOUNTER — Inpatient Hospital Stay (HOSPITAL_COMMUNITY): Payer: No Typology Code available for payment source

## 2023-08-01 DIAGNOSIS — I428 Other cardiomyopathies: Secondary | ICD-10-CM | POA: Diagnosis not present

## 2023-08-01 DIAGNOSIS — R7303 Prediabetes: Secondary | ICD-10-CM

## 2023-08-01 DIAGNOSIS — N179 Acute kidney failure, unspecified: Secondary | ICD-10-CM | POA: Diagnosis not present

## 2023-08-01 DIAGNOSIS — I2510 Atherosclerotic heart disease of native coronary artery without angina pectoris: Secondary | ICD-10-CM | POA: Diagnosis not present

## 2023-08-01 DIAGNOSIS — I5033 Acute on chronic diastolic (congestive) heart failure: Secondary | ICD-10-CM | POA: Diagnosis not present

## 2023-08-01 DIAGNOSIS — I4891 Unspecified atrial fibrillation: Secondary | ICD-10-CM | POA: Diagnosis not present

## 2023-08-01 LAB — COOXEMETRY PANEL
Carboxyhemoglobin: 1.6 % — ABNORMAL HIGH (ref 0.5–1.5)
Carboxyhemoglobin: 1.5 % (ref 0.5–1.5)
Carboxyhemoglobin: 1.5 % (ref 0.5–1.5)
Methemoglobin: <0.7 % (ref 0.0–1.5)
Methemoglobin: <0.7 % (ref 0.0–1.5)
Methemoglobin: <0.7 % (ref 0.0–1.5)
O2 Saturation: 74.1 %
O2 Saturation: 69.4 %
O2 Saturation: 67.4 %
Total hemoglobin: 14.4 g/dL (ref 12.0–16.0)
Total hemoglobin: 14.5 g/dL (ref 12.0–16.0)
Total hemoglobin: 14.6 g/dL (ref 12.0–16.0)

## 2023-08-01 LAB — BASIC METABOLIC PANEL
Anion gap: 7 (ref 5–15)
BUN: 23 mg/dL (ref 8–23)
CO2: 27 mmol/L (ref 22–32)
Calcium: 10.1 mg/dL (ref 8.9–10.3)
Chloride: 103 mmol/L (ref 98–111)
Creatinine, Ser: 1.79 mg/dL — ABNORMAL HIGH (ref 0.61–1.24)
GFR, Estimated: 41 mL/min — ABNORMAL LOW (ref >60)
Glucose, Bld: 183 mg/dL — ABNORMAL HIGH (ref 70–99)
Potassium: 3.6 mmol/L (ref 3.5–5.1)
Sodium: 137 mmol/L (ref 135–145)

## 2023-08-01 LAB — MAGNESIUM: Magnesium: 2 mg/dL (ref 1.7–2.4)

## 2023-08-01 LAB — GLUCOSE, CAPILLARY
Glucose-Capillary: 154 mg/dL — ABNORMAL HIGH (ref 70–99)
Glucose-Capillary: 114 mg/dL — ABNORMAL HIGH (ref 70–99)
Glucose-Capillary: 92 mg/dL (ref 70–99)
Glucose-Capillary: 118 mg/dL — ABNORMAL HIGH (ref 70–99)
Glucose-Capillary: 157 mg/dL — ABNORMAL HIGH (ref 70–99)

## 2023-08-01 MED ORDER — GADOBUTROL 1 MMOL/ML IV SOLN
10.0000 mL | Freq: Once | INTRAVENOUS | Status: AC | PRN
  Administered 2023-08-01: 10 mL via INTRAVENOUS

## 2023-08-01 MED ORDER — FUROSEMIDE 10 MG/ML IJ SOLN: 4.0000 mg/h | INTRAVENOUS | Status: DC

## 2023-08-01 MED ORDER — EPLERENONE 25 MG PO TABS
50.0000 mg | ORAL_TABLET | Freq: Every day | ORAL | Status: DC
  Administered 2023-08-02: 50 mg via ORAL
  Filled 2023-08-01: qty 2

## 2023-08-01 MED ORDER — ISOSORB DINITRATE-HYDRALAZINE 20-37.5 MG PO TABS
2.0000 | ORAL_TABLET | Freq: Three times a day (TID) | ORAL | Status: DC
  Administered 2023-08-01 – 2023-08-02 (×3): 2 via ORAL
  Filled 2023-08-01 (×3): qty 2

## 2023-08-01 MED ORDER — ATORVASTATIN CALCIUM 80 MG PO TABS
80.0000 mg | ORAL_TABLET | Freq: Every day | ORAL | Status: DC
  Administered 2023-08-02: 80 mg via ORAL
  Filled 2023-08-01: qty 1

## 2023-08-01 MED ORDER — POTASSIUM CHLORIDE CRYS ER 20 MEQ PO TBCR
40.0000 meq | EXTENDED_RELEASE_TABLET | Freq: Once | ORAL | Status: AC
  Administered 2023-08-01: 40 meq via ORAL
  Filled 2023-08-01: qty 2

## 2023-08-01 MED ORDER — POTASSIUM CHLORIDE CRYS ER 20 MEQ PO TBCR
20.0000 meq | EXTENDED_RELEASE_TABLET | Freq: Two times a day (BID) | ORAL | Status: DC
  Administered 2023-08-01 – 2023-08-02 (×3): 20 meq via ORAL
  Filled 2023-08-01 (×3): qty 1

## 2023-08-01 MED ORDER — EPLERENONE 25 MG PO TABS
50.0000 mg | ORAL_TABLET | Freq: Every day | ORAL | Status: DC
  Filled 2023-08-01: qty 2

## 2023-08-01 NOTE — Discharge Instructions (Signed)
Information on my medicine - ELIQUIS® (apixaban) ° °This medication education was reviewed with me or my healthcare representative as part of my discharge preparation.  The pharmacist that spoke with me during my hospital stay was:  Sheranda Seabrooks C, RPH ° °Why was Eliquis® prescribed for you? °Eliquis® was prescribed for you to reduce the risk of a blood clot forming that can cause a stroke if you have a medical condition called atrial fibrillation (a type of irregular heartbeat). ° °What do You need to know about Eliquis® ? °Take your Eliquis® TWICE DAILY - one tablet in the morning and one tablet in the evening with or without food. If you have difficulty swallowing the tablet whole please discuss with your pharmacist how to take the medication safely. ° °Take Eliquis® exactly as prescribed by your doctor and DO NOT stop taking Eliquis® without talking to the doctor who prescribed the medication.  Stopping may increase your risk of developing a stroke.  Refill your prescription before you run out. ° °After discharge, you should have regular check-up appointments with your healthcare provider that is prescribing your Eliquis®.  In the future your dose may need to be changed if your kidney function or weight changes by a significant amount or as you get older. ° °What do you do if you miss a dose? °If you miss a dose, take it as soon as you remember on the same day and resume taking twice daily.  Do not take more than one dose of ELIQUIS at the same time to make up a missed dose. ° °Important Safety Information °A possible side effect of Eliquis® is bleeding. You should call your healthcare provider right away if you experience any of the following: °? Bleeding from an injury or your nose that does not stop. °? Unusual colored urine (red or dark brown) or unusual colored stools (red or black). °? Unusual bruising for unknown reasons. °? A serious fall or if you hit your head (even if there is no bleeding). ° °Some  medicines may interact with Eliquis® and might increase your risk of bleeding or clotting while on Eliquis®. To help avoid this, consult your healthcare provider or pharmacist prior to using any new prescription or non-prescription medications, including herbals, vitamins, non-steroidal anti-inflammatory drugs (NSAIDs) and supplements. ° °This website has more information on Eliquis® (apixaban): http://www.eliquis.com/eliquis/home °

## 2023-08-01 NOTE — Progress Notes (Signed)
Progress Note  Patient Name: Kevin Heath Date of Encounter: 08/01/2023  Primary Cardiologist: None   Subjective   Patient seen examined his bedside.  He was awake when I arrived.    Inpatient Medications    Scheduled Meds:  amiodarone  400 mg Oral BID   Followed by   Melene Muller ON 08/05/2023] amiodarone  200 mg Oral Daily   apixaban  5 mg Oral BID   [START ON 08/02/2023] atorvastatin  80 mg Oral Daily   Chlorhexidine Gluconate Cloth  6 each Topical Daily   [START ON 08/02/2023] eplerenone  50 mg Oral Daily   insulin aspart  0-6 Units Subcutaneous TID WC   isosorbide-hydrALAZINE  2 tablet Oral TID   sodium chloride flush  10-40 mL Intracatheter Q12H   sodium chloride flush  3 mL Intravenous Q12H   sodium chloride flush  3 mL Intravenous Q12H   Continuous Infusions:  milrinone 0.125 mcg/kg/min (08/01/23 0840)   PRN Meds: acetaminophen, ipratropium-albuterol, nitroGLYCERIN, sodium chloride flush, sodium chloride flush   Vital Signs    Vitals:   08/01/23 0051 08/01/23 0624 08/01/23 0807 08/01/23 1033  BP: 132/82 (!) 132/90 131/77 118/74  Pulse: 64 67 64 62  Resp: 15 17 18 19   Temp: 98 F (36.7 C) 98 F (36.7 C) (!) 97.4 F (36.3 C) 97.7 F (36.5 C)  TempSrc: Axillary Axillary Oral Oral  SpO2: 98% 98% 98% 96%  Weight:  94.3 kg    Height:        Intake/Output Summary (Last 24 hours) at 08/01/2023 1039 Last data filed at 08/01/2023 1033 Gross per 24 hour  Intake 1144.02 ml  Output 1950 ml  Net -805.98 ml   Filed Weights   07/30/23 0451 07/31/23 0523 08/01/23 0624  Weight: 94.1 kg 93.7 kg 94.3 kg    Telemetry    Sinus rhythm- Personally Reviewed  ECG    None today- Personally Reviewed  Physical Exam    General: Comfortable lying in bed Head: Atraumatic, normal size  Eyes: PEERLA, EOMI  Neck: Supple, normal JVD Cardiac: Normal S1, S2; RRR; no murmurs, rubs, or gallops Lungs: Clear to auscultation bilaterally Abd: Soft, nontender, no  hepatomegaly  Ext: warm, no edema Musculoskeletal: No deformities, BUE and BLE strength normal and equal Skin: Warm and dry, no rashes   Neuro: Alert and oriented to person, place, time, and situation, CNII-XII grossly intact, no focal deficits  Psych: Normal mood and affect   Labs    Chemistry Recent Labs  Lab 07/26/23 1825 07/26/23 1832 07/31/23 0508 07/31/23 1500 08/01/23 0520  NA 140   < > 133* 139 137  K 3.2*   < > 2.9* 3.7 3.6  CL 104   < > 98 103 103  CO2 25   < > 28 29 27   GLUCOSE 149*   < > 343* 125* 183*  BUN 15   < > 25* 26* 23  CREATININE 1.95*   < > 1.80* 2.00* 1.79*  CALCIUM 9.5   < > 9.1 10.3 10.1  PROT 6.6  --   --   --   --   ALBUMIN 3.7  --   --   --   --   AST 43*  --   --   --   --   ALT 53*  --   --   --   --   ALKPHOS 84  --   --   --   --   BILITOT 0.7  --   --   --   --  GFRNONAA 37*   < > 41* 36* 41*  ANIONGAP 11   < > 7 7 7    < > = values in this interval not displayed.     Hematology Recent Labs  Lab 07/29/23 0243 07/29/23 1231 07/29/23 1248 07/29/23 1641 07/30/23 0234  WBC 12.0*  --   --  9.5 9.8  RBC 4.52  --   --  5.02 4.85  HGB 13.0   < > 14.3 14.3 13.6  HCT 41.3   < > 42.0 45.5 43.3  MCV 91.4  --   --  90.6 89.3  MCH 28.8  --   --  28.5 28.0  MCHC 31.5  --   --  31.4 31.4  RDW 13.2  --   --  13.2 13.0  PLT 202  --   --  212 196   < > = values in this interval not displayed.    Cardiac EnzymesNo results for input(s): "TROPONINI" in the last 168 hours. No results for input(s): "TROPIPOC" in the last 168 hours.   BNP Recent Labs  Lab 07/26/23 1825  BNP 234.4*     DDimer No results for input(s): "DDIMER" in the last 168 hours.   Radiology    No results found.  Cardiac Studies   Reviewed recent echocardiogram and  cardiac catheterization  Patient Profile     67 y.o. male heart failure reduced ejection fraction with new onset atrial fibrillation  Assessment & Plan    Acute on Chronic HFrEF NICM with  cardiogenic shock Minimal CAD New onset atrial fibrillation  NSVT Electrolyte abnormalities AKI on CKD  Lasix drip stopped yesterday due to had significant hypokalemia, he has been repleted potassium back 3.6 today.  I am going to restart the Lasix drip and add potassium on a daily basis 20 mEq twice daily times while on a drip.  He is allergic to Aldactone and is not on a potassium sparing medication.   Some NSVT on tele -continue amiodarone    HLN - cont lipitor    He is back in sinus rhythm, chadsvasc score is 45 (age greater than 49, hypertension, history of CHF)-post catheterization he will need to be transition to oral anticoagulant like Eliquis 5 mg twice daily.  Most he was transition to amiodarone p.o. yesterday.   Has allergies to lisinopril which is swelling need to understand to make sure this is not angioedema which would complicate optimizing his GDMT, Also is allergic to aldactone.  In the meantime he has been started on BiDil, in outpatient setting he will benefit from eplerenone given the fact that he is allergic to Aldactone.  Once improved from a cardiogenic shock standpoint can be restarted on his beta-blocker.   Aortic dilatation - need outpatient following for repeat imaging      For questions or updates, please contact [facility] Please consult [email or web address] for contact info under Cardiology/STEMI.      Signed, Thomasene Ripple, DO  08/01/2023, 10:39 AM

## 2023-08-01 NOTE — Progress Notes (Addendum)
Advanced Heart Failure Rounding Note  PCP-Cardiologist: None   Subjective:    On Milrinone 0.25 + Lasix 8.  Co-ox 74. CVP 2. sCr improving. 2>1.79  Feels well this morning. No SOB on RA. No chest pain. Sitting up in bed.  Objective:   Weight Range: 94.3 kg Body mass index is 33.55 kg/m.   Vital Signs:   Temp:  [97.4 F (36.3 C)-98 F (36.7 C)] 98 F (36.7 C) (11/29 0624) Pulse Rate:  [62-75] 67 (11/29 0624) Resp:  [15-22] 17 (11/29 0624) BP: (117-132)/(75-90) 132/90 (11/29 0624) SpO2:  [93 %-98 %] 98 % (11/29 0624) Weight:  [94.3 kg] 94.3 kg (11/29 0624) Last BM Date : 07/28/23  Weight change: Filed Weights   07/30/23 0451 07/31/23 0523 08/01/23 0624  Weight: 94.1 kg 93.7 kg 94.3 kg    Intake/Output:   Intake/Output Summary (Last 24 hours) at 08/01/2023 0742 Last data filed at 08/01/2023 6578 Gross per 24 hour  Intake 664.02 ml  Output 2100 ml  Net -1435.98 ml    Physical Exam    CVP 2 General: Well appearing. No distress on RA HEENT: neck supple.   Cardiac: JVP 7cm. S1 and S2 present. No murmurs or rub. Resp: Lung sounds clear and equal B/L Abdomen: Soft, non-tender, non-distended. + BS. Extremities: Warm and dry. No rash, cyanosis.  No edema.  Neuro: Alert and oriented x3. Affect pleasant. Moves all extremities without difficulty. Lines/Devices:  RUE PICC  Telemetry   SR 60s. 12 beat NSVT on tele this morning (personally reviewed)  EKG    N/A  Labs    CBC Recent Labs    07/29/23 1641 07/30/23 0234  WBC 9.5 9.8  HGB 14.3 13.6  HCT 45.5 43.3  MCV 90.6 89.3  PLT 212 196   Basic Metabolic Panel Recent Labs    46/96/29 0508 07/31/23 1500 08/01/23 0520  NA 133* 139 137  K 2.9* 3.7 3.6  CL 98 103 103  CO2 28 29 27   GLUCOSE 343* 125* 183*  BUN 25* 26* 23  CREATININE 1.80* 2.00* 1.79*  CALCIUM 9.1 10.3 10.1  MG 1.9  --  2.0   Liver Function Tests No results for input(s): "AST", "ALT", "ALKPHOS", "BILITOT", "PROT", "ALBUMIN"  in the last 72 hours. No results for input(s): "LIPASE", "AMYLASE" in the last 72 hours. Cardiac Enzymes No results for input(s): "CKTOTAL", "CKMB", "CKMBINDEX", "TROPONINI" in the last 72 hours.  BNP: BNP (last 3 results) Recent Labs    01/11/23 1705 07/26/23 1825  BNP 237.1* 234.4*    ProBNP (last 3 results) No results for input(s): "PROBNP" in the last 8760 hours.   D-Dimer No results for input(s): "DDIMER" in the last 72 hours. Hemoglobin A1C No results for input(s): "HGBA1C" in the last 72 hours. Fasting Lipid Panel No results for input(s): "CHOL", "HDL", "LDLCALC", "TRIG", "CHOLHDL", "LDLDIRECT" in the last 72 hours. Thyroid Function Tests No results for input(s): "TSH", "T4TOTAL", "T3FREE", "THYROIDAB" in the last 72 hours.  Invalid input(s): "FREET3"  Other results:  Imaging   No results found.  Medications:     Scheduled Medications:  amiodarone  400 mg Oral BID   Followed by   Melene Muller ON 08/05/2023] amiodarone  200 mg Oral Daily   apixaban  5 mg Oral BID   atorvastatin  40 mg Oral Daily   Chlorhexidine Gluconate Cloth  6 each Topical Daily   insulin aspart  0-6 Units Subcutaneous TID WC   isosorbide-hydrALAZINE  1.5 tablet Oral TID  potassium chloride  40 mEq Oral Once   sodium chloride flush  10-40 mL Intracatheter Q12H   sodium chloride flush  3 mL Intravenous Q12H   sodium chloride flush  3 mL Intravenous Q12H    Infusions:  furosemide (LASIX) 200 mg in dextrose 5 % 100 mL (2 mg/mL) infusion 8 mg/hr (08/01/23 0506)   milrinone 0.25 mcg/kg/min (08/01/23 0506)    PRN Medications: acetaminophen, ipratropium-albuterol, nitroGLYCERIN, sodium chloride flush, sodium chloride flush  Patient Profile   Kevin Heath is a 67 y.o. male with a hx of HTN, HLD, CKD, OSA, HFimpEF, CAD, COPD. Now admitted for CHF exacerbation and atrial fibrillation (new diagnosis)  Assessment/Plan   Acute on Chronic Systolic and Diastolic Heart Failure - Non  ischemic CM. ?HTN ?tachyarrythmia - Echo 6/24: EF 40-45% nl RV, prolapse of posterior MV leaflet w mild MR - Echo 11/26: EF 30% gHK, G2DD, RV nl, triv MR.  - R/LHC 11/26: Minimal CAD in R dominant. mean PAP 43 to 45, PCWP 38, RA 14, CO/CI (fick) 3.62/1.75. - CVP 2. Stop Lasix gtt. Resume PO tomorrow. - On Milrinone 0.25 mcg/kg/min. Co-ox 74. Weaning today.  - Increase Bidil to 2 tab TID - No beta blocker with low output - Start home Eplerenone tomorrow - cMRI today   2. NSVT PVCs - 12 beats of NSVT this am - Amio now off, on PO amio - K 3.6, supp   3. Afib RVR (new onset) - in there setting of continuous albuterol/Bipap and volume overload - diltiazem gtt and heparin gtt started in ED, converted to SR. Now off for bradycardia. - in NSR on tele - Amio transitioned to PO yesterday   4. AKI on CKD3a - cardio-renal. Baseline uncertain, last known sCr 1.38. - sCr on admit 1.95 - Improving with diuresis 1.79 today   5. CAD - presented with chest pain/pressure. Improved. - minimal CAD noted on R dominate system - Continue aspirin and statin   6. Pulmonary Hypertension - Who group II, ?some component of III  - On milrinone   7. HTN - 130-140/90s overnight - Increase BiDil 2 tab tid   8. COPD/OSA - on CPAP  Length of Stay: 6  [place] Manuelito Poage, NP  08/01/2023, 7:42 AM  Advanced Heart Failure Team Pager [phone] (M-F; 7a - 5p)  Please contact [facility] for night-coverage after hours (5p -7a ) and weekends on [email or web address]

## 2023-08-01 NOTE — TOC Progression Note (Signed)
Transition of Care [facility]) - Progression Note    Patient Details  Name: Kevin Heath MRN: [id number] Date of Birth: 1955-10-21  Transition of Care [facility]) CM/SW Contact  Nicanor Bake Phone Number: [phone] 08/01/2023, 2:45 PM  Clinical Narrative:  HF CSW met with pt at bedside. Pt appeared too be in a good mood. He had music playing and smiling. Pt stated that his niece came to visit him yesterday, but he chose not to eat the food she brought him. Pt stated that made him happy Pt stated that he is hoping he can dc tomorrow. CSW encouraged the pt to continue to make healthy food choices. Pt stated that his family will provide transportation at dc.   TOC will continue following.      Expected Discharge Plan: Home/Self Care Barriers to Discharge: Continued Medical Work up  Expected Discharge Plan and Services In-house Referral: NA Discharge Planning Services: CM Consult Post Acute Care Choice: NA Living arrangements for the past 2 months: Apartment                 DME Arranged: N/A DME Agency: NA       HH Arranged: NA           Social Determinants of Health (SDOH) Interventions SDOH Screenings   Food Insecurity: No Food Insecurity (07/26/2023)  Housing: Low Risk  (07/26/2023)  Transportation Needs: No Transportation Needs (07/26/2023)  Utilities: Not At Risk (07/26/2023)  Social Connections: Unknown (04/29/2023)   Received from [facility]    Readmission Risk Interventions     No data to display

## 2023-08-01 NOTE — Assessment & Plan Note (Signed)
Hgb A1c is 6.1  During his hospitalization he was placed on insulin sliding scale for glucose cover and monitoring.  His fasting glucose on the day of his discharge was 137 mg/dl.

## 2023-08-01 NOTE — Progress Notes (Addendum)
Progress Note   Patient: Kevin Heath WGN:[id number] DOB: 05/30/1956 DOA: 07/26/2023     6 DOS: the patient was seen and examined on 08/01/2023   Brief hospital course: Mr. Stork was admitted to the hospital with the working diagnosis of heart failure exacerbation   67 yo male with the past medical history of heart failure, COPD, hypertension, hyperlipidemia, CKD, and OSA who presented with progressive dyspnea and chest pain. Reported 2 weeks of angina symptoms with exertional chest pain, improved with rest. Endorsed dietary indiscretions and worsening lower extremity edema.  On his initial physical examination his blood pressure was 113/74, HR 73, RR 32 and 02 saturation 96%, lungs with rales bilaterally and increased work of breathing, heart with S1 and S2 present and tachycardic, irregularly irregular, abdomen with no distention and positive lower extremity edema.   Na 140, K 3,2 Cl 104 bicarbonate 25, glucose 149, bun 15 cr 1,95  AST 43 and ALT 53  BNP 234  High sensitive troponin 20, 23 and 49  Wbc 10,7 hgb 13,9 plt 200  Sars covid 19 negative  Urine analysis SG 1,009, protein 30, negative leukocytes and negative Hgb.  Toxicology screen negative   Chest radiograph with left rotation with bilateral hilar vascular congestion, bilateral central interstitial infiltrates, with right base atelectasis.   EKG 171 bpm, normal axis, normal intervals, atrial fibrillation rhythm with poor R R wave progression with no significant ST segment or T wave changes.   Patient was placed on IV heparin for anticoagulation and IV diltiazem for rate control.  IV furosemide for diuresis.   11/25 improvement in volume status, plan for coronary angiography tomorrow.  11/26 cardiac catheterization with low cardiac output.  Consulted advance heart failure, patient may need inotropic support.  11/27 patient on milrinone, amiodarone and furosemide drips with good toleration.  11/28 improved hemodynamics  with inotropic support.  11/28 continue to improve volume status, off milrinone today.   Assessment and Plan: * Acute on chronic diastolic CHF (congestive heart failure) (HCC) Echocardiogram with reduced LV systolic function with EF 30%, global hypokinesis, RV systolic function preserved, LA with mild dilatation, no pericardial effusion,   11/26 cardiac catheterization  RA 16 RV 50/6  PA 58/38 mean 43-45 PCWP 38 -35  Cardiac output 3,6 and index 1,72 (Fick).   Urine output is 2,100 ml  Systolic blood pressure 130 mmHg.  SV02 69.4   11/29 off milrinone.  After load reduction with hydralazine and isosorbide.  Hold on loop diuretic for today.  Started on eplerenone   Atrial fibrillation with rapid ventricular response (HCC) Telemetry patient continue with sinus rhythm with occasional PVC  Rate in the 60 bpm.      Continue oral amiodarone load.  Anticoagulation with apixaban.   CAD (coronary artery disease) Coronary angiography with minimal coronary artery disease in a right dominant system.  Continue statin therapy, because patient on apixaban will hold on antiplatelet therapy to decrease risk of bleeding.   Unstable angina now has been ruled out.   Acute kidney injury superimposed on chronic kidney disease (HCC) CKD stage 3 a. Hypokalemia.   Improved volume status, renal function today with serum cr at 1,79 with K at 3,6 and serum bicarbonate at 27  Na 137 and Mg 2.   Add 40 meq Kcl today.  Follow up renal function in am.   Obesity, class 2 Calculated BMI is 35.1 Pre diabetes continue insulin sliding scale to cover hyperglycemia..   Pre-diabetes Continue insulin sliding scale for  glucose cover and monitoring Hgb A1c is 6.1         Subjective: Patient is feeling better, dyspnea and orthopnea have resolved, no PND or lower extremity edema. Abdominal distention improved   Physical Exam: Vitals:   08/01/23 0051 08/01/23 0624 08/01/23 0807 08/01/23 1033  BP:  132/82 (!) 132/90 131/77 118/74  Pulse: 64 67 64 62  Resp: 15 17 18 19   Temp: 98 F (36.7 C) 98 F (36.7 C) (!) 97.4 F (36.3 C) 97.7 F (36.5 C)  TempSrc: Axillary Axillary Oral Oral  SpO2: 98% 98% 98% 96%  Weight:  94.3 kg    Height:       Neurology awake and alert ENT with mild pallor Cardiovascular with S1 and S2 present and regular with no gallops, rubs or murmurs Mild JVD No lower extremity edema Respiratory with mild rales at bases with no wheezing or rhonchi Abdomen with no distention  Data Reviewed:    Family Communication: no family at the bedside   Disposition: Status is: Inpatient Remains inpatient appropriate because: possible discharge home tomorrow   Planned Discharge Destination: Home     Author: Coralie Keens, MD 08/01/2023 4:03 PM  For on call review [email or web address].

## 2023-08-01 NOTE — Plan of Care (Signed)
  Problem: Education: Goal: Knowledge of General Education information will improve Description Including pain rating scale, medication(s)/side effects and non-pharmacologic comfort measures Outcome: Progressing   Problem: Clinical Measurements: Goal: Ability to maintain clinical measurements within normal limits will improve Outcome: Progressing   Problem: Clinical Measurements: Goal: Will remain free from infection Outcome: Progressing   Problem: Activity: Goal: Risk for activity intolerance will decrease Outcome: Progressing   Problem: Nutrition: Goal: Adequate nutrition will be maintained Outcome: Progressing   Problem: Coping: Goal: Level of anxiety will decrease Outcome: Progressing

## 2023-08-01 NOTE — Progress Notes (Signed)
ANTICOAGULATION CONSULT NOTE  Pharmacy Consult for Eliquis Indication: atrial fibrillation  Allergies  Allergen Reactions   Angiotensin Anaphylaxis   Ace Inhibitors Swelling   Lisinopril Swelling    Respiratory arrest   Sertraline Hcl     Lightheaded   Spironolactone     Engorgement of breasts    Patient Measurements: Height: 5\' 6"  (167.6 cm) Weight: 94.3 kg (207 lb 14.3 oz) IBW/kg (Calculated) : 63.8 Heparin Dosing Weight: 86 kg  Vital Signs: Temp: 97.4 F (36.3 C) (11/29 0807) Temp Source: Oral (11/29 0807) BP: 131/77 (11/29 0807) Pulse Rate: 64 (11/29 0807)  Labs: Recent Labs    07/29/23 1248 07/29/23 1248 07/29/23 1641 07/30/23 0234 07/30/23 1728 07/31/23 0508 07/31/23 1500 08/01/23 0520  HGB 14.3  --  14.3 13.6  --   --   --   --   HCT 42.0  --  45.5 43.3  --   --   --   --   PLT  --   --  212 196  --   --   --   --   CREATININE  --    < > 1.64* 1.67*   < > 1.80* 2.00* 1.79*   < > = values in this interval not displayed.    Estimated Creatinine Clearance: 43.6 mL/min (A) (by C-G formula based on SCr of 1.79 mg/dL (H)).   Medical History: Past Medical History:  Diagnosis Date   Asthma    CHF (congestive heart failure) (HCC)    CKD (chronic kidney disease)    COPD (chronic obstructive pulmonary disease) (HCC)    HLD (hyperlipidemia)    HTN (hypertension)    MI (myocardial infarction) (HCC)    Sleep apnea    Assessment: 48 yom with a history of HTN, COPD, HF. Patient is presenting with SOB. Heparin per pharmacy consult placed for atrial fibrillation. Patient started on BiPAP and continuous albuterol on arrival to ED. While on BiPAP experienced AF w/ HR in the 170s. Patient is not on anticoagulation prior to arrival.  CBC stable on apixaban 5 mg BID.  Goal of Therapy:  Monitor platelets by anticoagulation protocol: Yes   Plan:  Continue apixaban 5 mg bid Gets meds from [place] so unable to check copay - send initial 30d free supply to  TOC.  Reece Leader, Colon Flattery, BCCP Clinical Pharmacist  08/01/2023 9:48 AM   [facility] pharmacy phone numbers are listed on [email or web address]

## 2023-08-02 ENCOUNTER — Other Ambulatory Visit: Payer: Self-pay | Admitting: Physician Assistant

## 2023-08-02 ENCOUNTER — Other Ambulatory Visit (HOSPITAL_COMMUNITY): Payer: Self-pay

## 2023-08-02 DIAGNOSIS — I5021 Acute systolic (congestive) heart failure: Secondary | ICD-10-CM | POA: Diagnosis not present

## 2023-08-02 DIAGNOSIS — I2510 Atherosclerotic heart disease of native coronary artery without angina pectoris: Secondary | ICD-10-CM | POA: Diagnosis not present

## 2023-08-02 DIAGNOSIS — I5033 Acute on chronic diastolic (congestive) heart failure: Secondary | ICD-10-CM | POA: Diagnosis not present

## 2023-08-02 DIAGNOSIS — N179 Acute kidney failure, unspecified: Secondary | ICD-10-CM | POA: Diagnosis not present

## 2023-08-02 DIAGNOSIS — R7303 Prediabetes: Secondary | ICD-10-CM

## 2023-08-02 DIAGNOSIS — R570 Cardiogenic shock: Secondary | ICD-10-CM | POA: Diagnosis not present

## 2023-08-02 DIAGNOSIS — I4891 Unspecified atrial fibrillation: Secondary | ICD-10-CM | POA: Diagnosis not present

## 2023-08-02 LAB — COOXEMETRY PANEL
Carboxyhemoglobin: 2.1 % — ABNORMAL HIGH (ref 0.5–1.5)
Methemoglobin: <0.7 % (ref 0.0–1.5)
O2 Saturation: 72.9 %
Total hemoglobin: 14.3 g/dL (ref 12.0–16.0)

## 2023-08-02 LAB — BASIC METABOLIC PANEL
Anion gap: 5 (ref 5–15)
BUN: 21 mg/dL (ref 8–23)
CO2: 26 mmol/L (ref 22–32)
Calcium: 9.9 mg/dL (ref 8.9–10.3)
Chloride: 107 mmol/L (ref 98–111)
Creatinine, Ser: 1.68 mg/dL — ABNORMAL HIGH (ref 0.61–1.24)
GFR, Estimated: 45 mL/min — ABNORMAL LOW (ref >60)
Glucose, Bld: 137 mg/dL — ABNORMAL HIGH (ref 70–99)
Potassium: 3.9 mmol/L (ref 3.5–5.1)
Sodium: 138 mmol/L (ref 135–145)

## 2023-08-02 LAB — GLUCOSE, CAPILLARY
Glucose-Capillary: 158 mg/dL — ABNORMAL HIGH (ref 70–99)
Glucose-Capillary: 113 mg/dL — ABNORMAL HIGH (ref 70–99)

## 2023-08-02 LAB — MAGNESIUM: Magnesium: 1.9 mg/dL (ref 1.7–2.4)

## 2023-08-02 MED ORDER — FUROSEMIDE 40 MG PO TABS
80.0000 mg | ORAL_TABLET | Freq: Every day | ORAL | Status: DC
  Administered 2023-08-02: 80 mg via ORAL
  Filled 2023-08-02: qty 2

## 2023-08-02 MED ORDER — HYDRALAZINE HCL 25 MG PO TABS
25.0000 mg | ORAL_TABLET | Freq: Three times a day (TID) | ORAL | 0 refills | Status: DC
  Filled 2023-08-02: qty 90, 30d supply, fill #0

## 2023-08-02 MED ORDER — APIXABAN 5 MG PO TABS
5.0000 mg | ORAL_TABLET | Freq: Two times a day (BID) | ORAL | 0 refills | Status: DC
  Filled 2023-08-02: qty 60, 30d supply, fill #0

## 2023-08-02 MED ORDER — AMIODARONE HCL 200 MG PO TABS
ORAL_TABLET | ORAL | 0 refills | Status: DC
  Filled 2023-08-02: qty 36, 27d supply, fill #0
  Filled 2023-08-02: qty 6, 6d supply, fill #0
  Filled 2023-08-02: qty 42, 33d supply, fill #0

## 2023-08-02 MED ORDER — ATORVASTATIN CALCIUM 80 MG PO TABS
80.0000 mg | ORAL_TABLET | Freq: Every day | ORAL | 0 refills | Status: DC
  Filled 2023-08-02: qty 30, 30d supply, fill #0

## 2023-08-02 NOTE — Progress Notes (Signed)
Cardiology Progress Note  Patient ID: Kevin Heath MRN: [id number] DOB: 1956-04-19 Date of Encounter: 08/02/2023 Primary Cardiologist: None  Subjective   Chief Complaint: None.   HPI: Reports he feels well.  Wanting to go home.  ROS:  All other ROS reviewed and negative. Pertinent positives noted in the HPI.     Telemetry  Overnight telemetry shows sinus rhythm 60s, which I personally reviewed.    Physical Exam   Vitals:   08/02/23 0053 08/02/23 0517 08/02/23 0710 08/02/23 0740  BP: 119/72 131/87  124/75  Pulse: 66 63  62  Resp: 19 20  17   Temp: 98.6 F (37 C) 98.3 F (36.8 C)  98.8 F (37.1 C)  TempSrc: Oral Axillary  Oral  SpO2: 96% 99%  95%  Weight:   94.3 kg   Height:        Intake/Output Summary (Last 24 hours) at 08/02/2023 1021 Last data filed at 08/02/2023 0810 Gross per 24 hour  Intake 1126 ml  Output 500 ml  Net 626 ml       08/02/2023    7:10 AM 08/01/2023    6:24 AM 07/31/2023    5:23 AM  Last 3 Weights  Weight (lbs) 207 lb 14.4 oz 207 lb 14.3 oz 206 lb 9.1 oz  Weight (kg) 94.303 kg 94.3 kg 93.7 kg    Body mass index is 33.56 kg/m.  General: Well nourished, well developed, in no acute distress Head: Atraumatic, normal size  Eyes: PEERLA, EOMI  Neck: Supple, JVD 5 to 7 cm of water Endocrine: No thryomegaly Cardiac: Normal S1, S2; RRR; no murmurs, rubs, or gallops Lungs: Clear to auscultation bilaterally, no wheezing, rhonchi or rales  Abd: Soft, nontender, no hepatomegaly  Ext: No edema, pulses 2+ Musculoskeletal: No deformities, BUE and BLE strength normal and equal Skin: Warm and dry, no rashes   Neuro: Alert and oriented to person, place, time, and situation, CNII-XII grossly intact, no focal deficits  Psych: Normal mood and affect   Cardiac Studies  TTE 07/28/2023  1. Left ventricular ejection fraction, by estimation, is 30%. The left  ventricle has moderate to severely decreased function. The left ventricle  demonstrates  global hypokinesis. The left ventricular internal cavity size  was mildly dilated. Left  ventricular diastolic parameters are consistent with Grade II diastolic  dysfunction (pseudonormalization).   2. Right ventricular systolic function is normal. The right ventricular  size is normal. Tricuspid regurgitation signal is inadequate for assessing  PA pressure.   3. Left atrial size was mildly dilated.   4. The mitral valve is normal in structure. Trivial mitral valve  regurgitation. No evidence of mitral stenosis.   5. The aortic valve is tricuspid. Aortic valve regurgitation is not  visualized. No aortic stenosis is present.   6. Aortic dilatation noted. There is mild dilatation of the ascending  aorta, measuring 41 mm.   7. The inferior vena cava is dilated in size with <50% respiratory  variability, suggesting right atrial pressure of 15 mmHg.   Patient Profile  Kevin Heath is a 67 y.o. male with systolic heart failure, hypertension, hyperlipidemia, COPD, minimal CAD, CKD stage IIIa admitted on 07/26/2023 for acute on chronic systolic heart failure and A-fib with RVR.  Course complicated by cardiogenic shock.  Assessment & Plan   # Acute on chronic systolic heart failure, EF 30% # Nonischemic cardiomyopathy # Cardiogenic shock # CKD stage IIIa -Has been effectively diuresed.  Euvolemic on examination.  Transition to oral  Lasix 80 mg daily. -Plan to continue BiDil at current dose at discharge. -On eplerenone 50 mg daily. -Would hold beta-blocker.  Has been weaned off milrinone.  Overall doing well.  Cardiac MRI has been performed but not read.  He does not need to wait for this prior to discharge.  This can be followed up in the heart failure clinic. -We will arrange outpatient follow-up.  # Paroxysmal A-fib with RVR -Would continue with oral amiodarone load.  400 mg twice daily for total of 5 days followed by 200 mg daily.  Continue Eliquis.  # Minimal CAD -Statin.  # CKD  stage IIIb -Stable  [facility] will sign off.   Medication Recommendations: As above Other recommendations (labs, testing, etc): None Follow up as an outpatient: We will arrange a 1 week outpatient heart failure follow-up clinic  For questions or updates, please contact [facility] Please consult [email or web address] for contact info under      Signed, Gerri Spore T. Flora Lipps, MD, [employer] [place]  [facility]  08/02/2023 10:21 AM

## 2023-08-02 NOTE — Plan of Care (Signed)
  Problem: Clinical Measurements: Goal: Ability to maintain clinical measurements within normal limits will improve Outcome: Progressing   Problem: Clinical Measurements: Goal: Cardiovascular complication will be avoided Outcome: Progressing   

## 2023-08-02 NOTE — Discharge Summary (Signed)
Physician Discharge Summary   Patient: Kevin Heath MRN: [id number] DOB: April 26, 1956  Admit date:     07/26/2023  Discharge date: 08/02/23  Discharge Physician: Coralie Keens   PCP: [facility]   Recommendations at discharge:    Patient has been placed on amiodarone 400 mg bid for 5 days then continue with 200 mg daily. Anticoagulation with apixaban.  Heart failure medical therapy with eplerenone, and hydralazine/ isosorbide.  Follow up cardiac MRI report as outpatient.  Evaluate as outpatient to start ARNI, holding on B blocker for now due to recovering from low output heart failure.   Discharge Diagnoses: Principal Problem:   Acute on chronic diastolic CHF (congestive heart failure) (HCC) Active Problems:   Atrial fibrillation with rapid ventricular response (HCC)   CAD (coronary artery disease)   Acute kidney injury superimposed on chronic kidney disease (HCC)   Obesity, class 2   Pre-diabetes  Resolved Problems:   * No resolved hospital problems. [facility] Course: Kevin Heath was admitted to the hospital with the working diagnosis of heart failure exacerbation   67 yo male with the past medical history of heart failure, COPD, hypertension, hyperlipidemia, CKD, and OSA who presented with progressive dyspnea and chest pain. Reported 2 weeks of angina symptoms with exertional chest pain, improved with rest. Endorsed dietary indiscretions and worsening lower extremity edema.  On his initial physical examination his blood pressure was 113/74, HR 73, RR 32 and 02 saturation 96%, lungs with rales bilaterally and increased work of breathing, heart with S1 and S2 present and tachycardic, irregularly irregular, abdomen with no distention and positive lower extremity edema.   Na 140, K 3,2 Cl 104 bicarbonate 25, glucose 149, bun 15 cr 1,95  AST 43 and ALT 53  BNP 234  High sensitive troponin 20, 23 and 49  Wbc 10,7 hgb 13,9 plt 200  Sars covid 19 negative   Urine analysis SG 1,009, protein 30, negative leukocytes and negative Hgb.  Toxicology screen negative   Chest radiograph with left rotation with bilateral hilar vascular congestion, bilateral central interstitial infiltrates, with right base atelectasis.   EKG 171 bpm, normal axis, normal intervals, atrial fibrillation rhythm with poor R R wave progression with no significant ST segment or T wave changes.   Patient was placed on IV heparin for anticoagulation and IV diltiazem for rate control.  IV furosemide for diuresis.   11/25 improvement in volume status, plan for coronary angiography tomorrow.  11/26 cardiac catheterization with low cardiac output.  Consulted advance heart failure, patient may need inotropic support.  11/27 patient on milrinone, amiodarone and furosemide drips with good toleration.  11/28 improved hemodynamics with inotropic support.  11/29 continue to improve volume status, off milrinone today.  11/30 plan for discharge home today and close follow up as outpatient.   Assessment and Plan: * Acute on chronic diastolic CHF (congestive heart failure) (HCC) Echocardiogram with reduced LV systolic function with EF 30%, global hypokinesis, RV systolic function preserved, LA with mild dilatation, no pericardial effusion,   11/26 cardiac catheterization  RA 16 RV 50/6  PA 58/38 mean 43-45 PCWP 38 -35  Cardiac output 3,6 and index 1,72 (Fick).   Patient had IV furosemide for diuresis, negative fluid balance was achieved - 7,661 ml with significant improvement in his symptoms.  Discharge SV02 is 72.9   Patient required inotropic support for low output heart failure.  He was placed on hydralazine and isosorbide for after load reduction.  Plan to  continue diuretic therapy with eplerenone and furosemide.   Plan to follow up as outpatient, he has been advised to be adherent with medical therapy and salt/ fluid restricted diet.    Atrial fibrillation with rapid  ventricular response (HCC) Patient has remained on sinus rhythm, he will continue amiodarone for rate and rhythm control. Anticoagulation with apixaban.    CAD (coronary artery disease) Coronary angiography with minimal coronary artery disease in a right dominant system.  Continue statin therapy, no antiplatelet therapy to avoid bleeding.    Unstable angina now has been ruled out.   Acute kidney injury superimposed on chronic kidney disease (HCC) CKD stage 3 a. Hypokalemia.   At the time of his discharge his renal function has a serum cr of 1,68 with K at 3,9 and serum bicarbonate at 26  Na 138 and Mg 1.9   Plan to continue diuresis with furosemide and eplerenone.  Follow up renal function and electrolytes as outpatient in 7 days.   Obesity, class 2 Calculated BMI is 35.1  Pre-diabetes Hgb A1c is 6.1  During his hospitalization he was placed on insulin sliding scale for glucose cover and monitoring.  His fasting glucose on the day of his discharge was 137 mg/dl.          Consultants: cardiology  Procedures performed:  left and right heart catheterization   Disposition: Home Diet recommendation:  Cardiac and Carb modified diet DISCHARGE MEDICATION: Allergies as of 08/02/2023       Reactions   Angiotensin Anaphylaxis   Ace Inhibitors Swelling   Lisinopril Swelling   Respiratory arrest   Sertraline Hcl    Lightheaded   Spironolactone    Engorgement of breasts        Medication List     STOP taking these medications    amLODipine 10 MG tablet Commonly known as: NORVASC   aspirin 81 MG chewable tablet   simvastatin 10 MG tablet Commonly known as: ZOCOR       TAKE these medications    albuterol 108 (90 Base) MCG/ACT inhaler Commonly known as: VENTOLIN HFA Inhale 2 puffs into the lungs every 6 (six) hours as needed for shortness of breath.   amiodarone 200 MG tablet Commonly known as: PACERONE Take 2 tablets (400 mg total) by mouth 2 (two) times  daily for 3 days, THEN 1 tablet (200 mg total) daily. Start taking on: August 02, 2023   apixaban 5 MG Tabs tablet Commonly known as: ELIQUIS Take 1 tablet (5 mg total) by mouth 2 (two) times daily.   atorvastatin 80 MG tablet Commonly known as: LIPITOR Take 1 tablet (80 mg total) by mouth daily. Start taking on: August 03, 2023   cetirizine 10 MG tablet Commonly known as: ZYRTEC Take 10 mg by mouth daily as needed for allergies.   eplerenone 50 MG tablet Commonly known as: INSPRA Take 50 mg by mouth daily.   furosemide 40 MG tablet Commonly known as: LASIX Take 80 mg by mouth daily.   hydrALAZINE 25 MG tablet Commonly known as: APRESOLINE Take 1 tablet (25 mg total) by mouth 3 (three) times daily. What changed: when to take this   isosorbide mononitrate 30 MG 24 hr tablet Commonly known as: IMDUR Take 30 mg by mouth daily.   magnesium oxide 400 MG tablet Commonly known as: MAG-OX Take 400 mg by mouth daily.   potassium chloride 10 MEQ tablet Commonly known as: KLOR-CON Take 30 mEq by mouth 2 (two) times daily.  Follow-up Information     [facility] Follow up.   Specialty: Cardiology Why: heart failure scheduler will contact you to arrange follow up, please give [place] a call if you do not hear from our scheduler in 3 bussiness days. Contact information: [address] [place] [address] [phone]               Discharge Exam: Ceasar Mons Weights   07/31/23 0523 08/01/23 0624 08/02/23 0710  Weight: 93.7 kg 94.3 kg 94.3 kg   BP 124/75 (BP Location: Left Arm)   Pulse 62   Temp 98.8 F (37.1 C) (Oral)   Resp 17   Ht 5\' 6"  (1.676 m)   Wt 94.3 kg   SpO2 95%   BMI 33.56 kg/m   Patient is feeling better, with no chest pain, no dyspnea, no PND or orthopnea. Lower extremity edema has resolved.   Neurology awake and alert ENT with no pallor Cardiovascular with S1 and S2 present and  regular with no gallops, rubs or murmurs No JVD No lower extremity edema Respiratory with no rales or wheezing, no rhonchi Abdomen with no distention   Condition at discharge: stable  The results of significant diagnostics from this hospitalization (including imaging, microbiology, ancillary and laboratory) are listed below for reference.   Imaging Studies: [place] EKG SITE RITE  Result Date: 07/29/2023 If Site Rite image not attached, placement could not be confirmed due to current cardiac rhythm.  CARDIAC CATHETERIZATION  Addendum Date: 07/29/2023   POST CATH FINDINGS Angiographically minimal CAD in a Right dominant system Borderline Severe Pulmonary Hypertension-Pulmonary Venous Congestion from Combined Systolic and Diastolic Heart Failure Mean PAP 43 to 45 mmHg with PCWP of roughly 38 mmHg and LVEDP of 40 mmHg. Cardiac output-index by Fick 3.62-1.75 RECOMMENDATIONS   Hemodynamic findings consistent with severe pulmonary hypertension.   There is no aortic valve stenosis.   Anticipated discharge date to be determined.   I have given 40 mg IV Lasix in the Cath Lab and will reinitiate Lasix drip. Gentle post cath hydration with 30 mL an hour for 6 hours.   No indication for antiplatelet therapy at this time . Bryan Lemma, MD   Result Date: 07/29/2023 POST CATH FINDINGS Angiographically minimal CAD in a Right dominant system Borderline Severe Pulmonary Hypertension-Pulmonary Venous Congestion from Combined Systolic and Diastolic Heart Failure Mean PAP 43 to 45 mmHg with PCWP of roughly 38 mmHg and LVEDP of 40 mmHg. Cardiac output-index by Fick 3.62-1.75 RECOMMENDATIONS   Hemodynamic findings consistent with severe pulmonary hypertension.   There is no aortic valve stenosis.   Anticipated discharge date to be determined.   I have given 40 mg IV Lasix in the Cath Lab and will reinitiate Lasix drip. Gentle post cath hydration with 30 mL an hour for 6 hours.   No indication for antiplatelet therapy at  this time . Bryan Lemma, MD  ECHOCARDIOGRAM COMPLETE  Result Date: 07/28/2023    ECHOCARDIOGRAM REPORT   Patient Name:   YOSHIMI CUTTLER Date of Exam: 07/28/2023 Medical Rec #:  [id number]        Height:       66.0 in Accession #:    [id number]       Weight:       215.4 lb Date of Birth:  1956/07/20        BSA:          2.064 m Patient Age:    37  years         BP:           132/99 mmHg Patient Gender: M                HR:           67 bpm. Exam Location:  Inpatient Procedure: 2D Echo, Cardiac Doppler, Color Doppler and Intracardiac            Opacification Agent Indications:    CHF  History:        Patient has no prior history of Echocardiogram examinations.                 CHF, Arrythmias:Atrial Fibrillation, Signs/Symptoms:Chest Pain;                 Risk Factors:Hypertension.  Sonographer:    Vern Claude Referring Phys: Dolly Rias IMPRESSIONS  1. Left ventricular ejection fraction, by estimation, is 30%. The left ventricle has moderate to severely decreased function. The left ventricle demonstrates global hypokinesis. The left ventricular internal cavity size was mildly dilated. Left ventricular diastolic parameters are consistent with Grade II diastolic dysfunction (pseudonormalization).  2. Right ventricular systolic function is normal. The right ventricular size is normal. Tricuspid regurgitation signal is inadequate for assessing PA pressure.  3. Left atrial size was mildly dilated.  4. The mitral valve is normal in structure. Trivial mitral valve regurgitation. No evidence of mitral stenosis.  5. The aortic valve is tricuspid. Aortic valve regurgitation is not visualized. No aortic stenosis is present.  6. Aortic dilatation noted. There is mild dilatation of the ascending aorta, measuring 41 mm.  7. The inferior vena cava is dilated in size with <50% respiratory variability, suggesting right atrial pressure of 15 mmHg. FINDINGS  Left Ventricle: Left ventricular ejection fraction, by estimation,  is 30%. The left ventricle has moderate to severely decreased function. The left ventricle demonstrates global hypokinesis. The left ventricular internal cavity size was mildly dilated. There is no left ventricular hypertrophy. Left ventricular diastolic parameters are consistent with Grade II diastolic dysfunction (pseudonormalization). Right Ventricle: The right ventricular size is normal. No increase in right ventricular wall thickness. Right ventricular systolic function is normal. Tricuspid regurgitation signal is inadequate for assessing PA pressure. Left Atrium: Left atrial size was mildly dilated. Right Atrium: Right atrial size was normal in size. Pericardium: There is no evidence of pericardial effusion. Mitral Valve: The mitral valve is normal in structure. Trivial mitral valve regurgitation. No evidence of mitral valve stenosis. MV peak gradient, 4.7 mmHg. The mean mitral valve gradient is 1.0 mmHg. Tricuspid Valve: The tricuspid valve is normal in structure. Tricuspid valve regurgitation is not demonstrated. Aortic Valve: The aortic valve is tricuspid. Aortic valve regurgitation is not visualized. No aortic stenosis is present. Aortic valve mean gradient measures 2.0 mmHg. Aortic valve peak gradient measures 3.7 mmHg. Aortic valve area, by VTI measures 2.01 cm. Pulmonic Valve: The pulmonic valve was normal in structure. Pulmonic valve regurgitation is trivial. Aorta: The aortic root is normal in size and structure and aortic dilatation noted. There is mild dilatation of the ascending aorta, measuring 41 mm. Venous: The inferior vena cava is dilated in size with less than 50% respiratory variability, suggesting right atrial pressure of 15 mmHg. IAS/Shunts: No atrial level shunt detected by color flow Doppler.  LEFT VENTRICLE PLAX 2D LVIDd:         5.80 cm LVIDs:         4.40 cm LV PW:  0.90 cm LV IVS:        0.70 cm LVOT diam:     2.10 cm LV SV:         33 LV SV Index:   16 LVOT Area:     3.46  cm  LV Volumes (MOD) LV vol d, MOD A4C: 149.0 ml LV vol s, MOD A4C: 94.0 ml LV SV MOD A4C:     149.0 ml RIGHT VENTRICLE             IVC RV Basal diam:  3.90 cm     IVC diam: 2.20 cm RV Mid diam:    2.90 cm RV S prime:     13.10 cm/s TAPSE (M-mode): 2.5 cm LEFT ATRIUM              Index        RIGHT ATRIUM           Index LA Vol (A2C):   108.0 ml 52.33 ml/m  RA Area:     14.60 cm LA Vol (A4C):   55.1 ml  26.70 ml/m  RA Volume:   35.90 ml  17.39 ml/m LA Biplane Vol: 79.0 ml  38.28 ml/m  AORTIC VALVE                    PULMONIC VALVE AV Area (Vmax):    2.19 cm     PV Vmax:          0.54 m/s AV Area (Vmean):   1.72 cm     PV Peak grad:     1.1 mmHg AV Area (VTI):     2.01 cm     PR End Diast Vel: 6.25 msec AV Vmax:           96.10 cm/s AV Vmean:          68.000 cm/s AV VTI:            0.164 m AV Peak Grad:      3.7 mmHg AV Mean Grad:      2.0 mmHg LVOT Vmax:         60.70 cm/s LVOT Vmean:        33.800 cm/s LVOT VTI:          0.095 m LVOT/AV VTI ratio: 0.58  AORTA Ao Root diam: 3.50 cm Ao Asc diam:  4.07 cm MITRAL VALVE MV Area (PHT): 5.75 cm    SHUNTS MV Area VTI:   1.34 cm    Systemic VTI:  0.10 m MV Peak grad:  4.7 mmHg    Systemic Diam: 2.10 cm MV Mean grad:  1.0 mmHg MV Vmax:       1.08 m/s MV Vmean:      42.6 cm/s MV Decel Time: 132 msec MV E velocity: 94.30 cm/s Dalton McleanMD Electronically signed by Wilfred Lacy Signature Date/Time: 07/28/2023/11:15:43 AM    Final    DG Abd 1 View  Result Date: 07/26/2023 CLINICAL DATA:  Abdominal pain EXAM: ABDOMEN - 1 VIEW COMPARISON:  None Available. FINDINGS: No dilated loops of bowel to suggest obstruction. No radio-opaque calculi or other significant radiographic abnormality are seen. IMPRESSION: Negative. Electronically Signed   By: Minerva Fester M.D.   On: 07/26/2023 22:35   DG Chest Portable 1 View  Result Date: 07/26/2023 CLINICAL DATA:  Shortness of breath EXAM: PORTABLE CHEST 1 VIEW COMPARISON:  Chest x-ray 01/11/2023 FINDINGS: The heart  is enlarged. There central pulmonary vascular congestion and some streaky  perihilar opacities. There is no pleural effusion or pneumothorax. No acute fractures are seen. IMPRESSION: Cardiomegaly with central pulmonary vascular congestion and streaky perihilar opacities, likely pulmonary edema. Electronically Signed   By: Darliss Cheney M.D.   On: 07/26/2023 19:20    Microbiology: Results for orders placed or performed during the hospital encounter of 07/26/23  Resp panel by RT-PCR (RSV, Flu A&B, Covid) Anterior Nasal Swab     Status: None   Collection Time: 07/26/23  6:19 PM   Specimen: Anterior Nasal Swab  Result Value Ref Range Status   SARS Coronavirus 2 by RT PCR NEGATIVE NEGATIVE Final   Influenza A by PCR NEGATIVE NEGATIVE Final   Influenza B by PCR NEGATIVE NEGATIVE Final    Comment: (NOTE) The Xpert Xpress SARS-CoV-2/FLU/RSV plus assay is intended as an aid in the diagnosis of influenza from Nasopharyngeal swab specimens and should not be used as a sole basis for treatment. Nasal washings and aspirates are unacceptable for Xpert Xpress SARS-CoV-2/FLU/RSV testing.  Fact Sheet for Patients: BloggerCourse.com  Fact Sheet for Healthcare Providers: SeriousBroker.it  This test is not yet approved or cleared by the [place] FDA and has been authorized for detection and/or diagnosis of SARS-CoV-2 by FDA under an Emergency Use Authorization (EUA). This EUA will remain in effect (meaning this test can be used) for the duration of the COVID-19 declaration under Section 564(b)(1) of the Act, 21 U.S.C. section 360bbb-3(b)(1), unless the authorization is terminated or revoked.     Resp Syncytial Virus by PCR NEGATIVE NEGATIVE Final    Comment: (NOTE) Fact Sheet for Patients: BloggerCourse.com  Fact Sheet for Healthcare Providers: SeriousBroker.it  This test is not yet approved or  cleared by the [place] FDA and has been authorized for detection and/or diagnosis of SARS-CoV-2 by FDA under an Emergency Use Authorization (EUA). This EUA will remain in effect (meaning this test can be used) for the duration of the COVID-19 declaration under Section 564(b)(1) of the Act, 21 U.S.C. section 360bbb-3(b)(1), unless the authorization is terminated or revoked.  Performed at [facility], [address] [place], [place] [address]     Labs: CBC: Recent Labs  Lab 07/26/23 1825 07/26/23 1832 07/27/23 0206 07/29/23 0243 07/29/23 1231 07/29/23 1241 07/29/23 1247 07/29/23 1248 07/29/23 1641 07/30/23 0234  WBC 10.7*  --  9.3 12.0*  --   --   --   --  9.5 9.8  NEUTROABS 5.3  --   --   --   --   --   --   --   --   --   HGB 13.9   < > 13.3 13.0   < > 14.3 14.6 14.3 14.3 13.6  HCT 44.6   < > 42.3 41.3   < > 42.0 43.0 42.0 45.5 43.3  MCV 91.0  --  89.8 91.4  --   --   --   --  90.6 89.3  PLT 200  --  210 202  --   --   --   --  212 196   < > = values in this interval not displayed.   Basic Metabolic Panel: Recent Labs  Lab 07/27/23 0206 07/28/23 0325 07/29/23 0243 07/29/23 1231 07/30/23 0234 07/30/23 1728 07/31/23 0508 07/31/23 1500 08/01/23 0520 08/02/23 0523  NA 138   < > 141   < > 141 135 133* 139 137 138  K 3.2*   < > 3.4*   < > 3.7 3.2* 2.9*  3.7 3.6 3.9  CL 102   < > 105   < > 104 98 98 103 103 107  CO2 19*   < > 28   < > 29 28 28 29 27 26   GLUCOSE 281*   < > 115*   < > 131* 267* 343* 125* 183* 137*  BUN 17   < > 19   < > 22 23 25* 26* 23 21  CREATININE 1.84*   < > 1.54*   < > 1.67* 1.68* 1.80* 2.00* 1.79* 1.68*  CALCIUM 9.5   < > 9.7   < > 10.0 9.9 9.1 10.3 10.1 9.9  MG 2.1   < > 1.9  --  2.2  --  1.9  --  2.0 1.9  PHOS 3.0  --   --   --   --   --   --   --   --   --    < > = values in this interval not displayed.   Liver Function Tests: Recent Labs  Lab 07/26/23 1825  AST 43*  ALT 53*  ALKPHOS 84  BILITOT 0.7  PROT 6.6  ALBUMIN  3.7   CBG: Recent Labs  Lab 08/01/23 1244 08/01/23 1602 08/01/23 2103 08/02/23 0650 08/02/23 1119  GLUCAP 92 118* 157* 158* 113*    Discharge time spent: greater than 30 minutes.  Signed: Coralie Keens, MD [facility] 08/02/2023

## 2023-08-02 NOTE — Progress Notes (Signed)
   08/02/23 1207  AVS Discharge Documentation  AVS Discharge Instructions Including Medications Provided to patient/caregiver  Name of Person Receiving AVS Discharge Instructions Including Medications Norlene Campbell  Name of Clinician That Reviewed AVS Discharge Instructions Including Medications Anne Shutter, RN   Per [facility] pharmacy, medication will be ready for pick up in 30 minutes.

## 2023-08-04 ENCOUNTER — Telehealth (HOSPITAL_COMMUNITY): Payer: Self-pay

## 2023-08-04 NOTE — Telephone Encounter (Signed)
Called patient at [phone] to schedule a POST HOSP f/u appt per APP clinic provider "[place] Lee". Left patient a voicemail to call scheduling back / front office to schedule a post hosp f/u appt with the APP clinic.

## 2023-08-07 ENCOUNTER — Encounter (HOSPITAL_COMMUNITY): Payer: Self-pay | Admitting: Cardiology

## 2023-08-07 ENCOUNTER — Ambulatory Visit (HOSPITAL_COMMUNITY)
Admission: RE | Admit: 2023-08-07 | Discharge: 2023-08-07 | Disposition: A | Discharge status: Home / Self Care | Payer: No Typology Code available for payment source | Source: Ambulatory Visit | Attending: Cardiology | Admitting: Cardiology

## 2023-08-07 ENCOUNTER — Other Ambulatory Visit (HOSPITAL_COMMUNITY): Payer: Self-pay

## 2023-08-07 VITALS — BP 148/86 | HR 66 | Wt 217.2 lb

## 2023-08-07 DIAGNOSIS — J4489 Other specified chronic obstructive pulmonary disease: Secondary | ICD-10-CM | POA: Insufficient documentation

## 2023-08-07 DIAGNOSIS — I493 Ventricular premature depolarization: Secondary | ICD-10-CM | POA: Insufficient documentation

## 2023-08-07 DIAGNOSIS — N1832 Chronic kidney disease, stage 3b: Secondary | ICD-10-CM | POA: Insufficient documentation

## 2023-08-07 DIAGNOSIS — Z95810 Presence of automatic (implantable) cardiac defibrillator: Secondary | ICD-10-CM | POA: Insufficient documentation

## 2023-08-07 DIAGNOSIS — I5022 Chronic systolic (congestive) heart failure: Secondary | ICD-10-CM | POA: Insufficient documentation

## 2023-08-07 DIAGNOSIS — Z79899 Other long term (current) drug therapy: Secondary | ICD-10-CM | POA: Insufficient documentation

## 2023-08-07 DIAGNOSIS — E782 Mixed hyperlipidemia: Secondary | ICD-10-CM

## 2023-08-07 DIAGNOSIS — G4733 Obstructive sleep apnea (adult) (pediatric): Secondary | ICD-10-CM | POA: Insufficient documentation

## 2023-08-07 DIAGNOSIS — I4729 Other ventricular tachycardia: Secondary | ICD-10-CM

## 2023-08-07 DIAGNOSIS — E785 Hyperlipidemia, unspecified: Secondary | ICD-10-CM | POA: Insufficient documentation

## 2023-08-07 DIAGNOSIS — I2510 Atherosclerotic heart disease of native coronary artery without angina pectoris: Secondary | ICD-10-CM | POA: Insufficient documentation

## 2023-08-07 DIAGNOSIS — I472 Ventricular tachycardia, unspecified: Secondary | ICD-10-CM | POA: Insufficient documentation

## 2023-08-07 DIAGNOSIS — J449 Chronic obstructive pulmonary disease, unspecified: Secondary | ICD-10-CM

## 2023-08-07 DIAGNOSIS — R600 Localized edema: Secondary | ICD-10-CM | POA: Insufficient documentation

## 2023-08-07 DIAGNOSIS — I10 Essential (primary) hypertension: Secondary | ICD-10-CM

## 2023-08-07 DIAGNOSIS — I130 Hypertensive heart and chronic kidney disease with heart failure and stage 1 through stage 4 chronic kidney disease, or unspecified chronic kidney disease: Secondary | ICD-10-CM | POA: Insufficient documentation

## 2023-08-07 LAB — BASIC METABOLIC PANEL
Anion gap: 6 (ref 5–15)
BUN: 22 mg/dL (ref 8–23)
CO2: 25 mmol/L (ref 22–32)
Calcium: 9.6 mg/dL (ref 8.9–10.3)
Chloride: 108 mmol/L (ref 98–111)
Creatinine, Ser: 1.71 mg/dL — ABNORMAL HIGH (ref 0.61–1.24)
GFR, Estimated: 44 mL/min — ABNORMAL LOW (ref >60)
Glucose, Bld: 108 mg/dL — ABNORMAL HIGH (ref 70–99)
Potassium: 3.8 mmol/L (ref 3.5–5.1)
Sodium: 139 mmol/L (ref 135–145)

## 2023-08-07 LAB — BRAIN NATRIURETIC PEPTIDE: B Natriuretic Peptide: 182.2 pg/mL — ABNORMAL HIGH (ref 0.0–100.0)

## 2023-08-07 MED ORDER — EMPAGLIFLOZIN 10 MG PO TABS: 10.0000 mg | ORAL_TABLET | Freq: Every day | ORAL | 5 refills | Status: DC

## 2023-08-07 MED ORDER — ISOSORB DINITRATE-HYDRALAZINE 20-37.5 MG PO TABS: 1.0000 | ORAL_TABLET | Freq: Three times a day (TID) | ORAL | 3 refills | Status: DC

## 2023-08-07 MED ORDER — CARVEDILOL 3.125 MG PO TABS: 3.1250 mg | ORAL_TABLET | Freq: Two times a day (BID) | ORAL | 3 refills | Status: DC

## 2023-08-07 MED ORDER — DAPAGLIFLOZIN PROPANEDIOL 10 MG PO TABS: 10.0000 mg | ORAL_TABLET | Freq: Every day | ORAL | 5 refills | Status: DC

## 2023-08-07 MED ORDER — POTASSIUM CHLORIDE ER 10 MEQ PO TBCR: 40.0000 meq | EXTENDED_RELEASE_TABLET | Freq: Every day | ORAL | 3 refills | Status: DC

## 2023-08-07 NOTE — Patient Instructions (Addendum)
Medication Changes:  START  JARDIANCE 10MG  DAILY   START: BIDIL- 1 TABLET 3 TIMES DAILY   IN 4 DAYS START:CARVEDILOL 3.125MG  TWICE DAILY   FOR THE NEXT 4 DAYS TAKE LASIX (FUROSEMIDE) 60MG  TWICE DAILY   FOR THE NEXT 4 DAYS TAKE OF POTASSIUM TWICE DAILY   IN 4 DAYS DECREASE POTASSIUM DOSE TO (4) TABLETS ONCE DAILY   [facility]- PLEASE CALL us IF YOU ARE UNABLE TO GET MEDICATIONS OR HAVE ISSUES  Lab Work:  Labs done today, your results will be available in MyChart, we will contact you for abnormal readings.  THEN LABS AGAIN AT YOUR NEXT APPOINTMENT   Follow-Up in: AS SCHEDULED WITH APP   At the [facility], you and your health needs are our priority. We have a designated team specialized in the treatment of Heart Failure. This Care Team includes your primary Heart Failure Specialized Cardiologist (physician), Advanced Practice Providers (APPs- Physician Assistants and Nurse Practitioners), and Pharmacist who all work together to provide you with the care you need, when you need it.   You may see any of the following providers on your designated Care Team at your next follow up:  Dr. Arvilla Meres Dr. Marca Ancona Dr. Dorthula Nettles Dr. Theresia Bough Tonye Becket, NP Robbie Lis, [place] [facility] [place], [place] Brynda Peon, NP [place] Lee, NP Karle Plumber, PharmD   Please be sure to bring in all your medications bottles to every appointment.   Need to Contact us:  If you have any questions or concerns before your next appointment please send [place] a message through [place] or call our office at [phone].    TO LEAVE A MESSAGE FOR THE NURSE SELECT OPTION 2, PLEASE LEAVE A MESSAGE INCLUDING: YOUR NAME DATE OF BIRTH CALL BACK NUMBER REASON FOR CALL**this is important as we prioritize the call backs  YOU WILL RECEIVE A CALL BACK THE SAME DAY AS LONG AS YOU CALL BEFORE 4:00 PM

## 2023-08-07 NOTE — Progress Notes (Signed)
ADVANCED HEART FAILURE CLINIC NOTE  Referring Physician: [facility]  Primary Care: [facility], [facility] HF: Dr. Gasper [facility]  HPI: [facility] is a 67 y.o. male hypertension, hyperlipidemia, chronic kidney disease, obstructive sleep apnea, COPD and heart failure with reduced ejection fraction presenting today to establish care  His cardiac history dates back to at least June 2024 when echocardiogram demonstrated EF of 40 to 45%.  He was admitted in August 2024 to the [facility] with chest pain and shortness of breath, received IV Lasix and was discharged home.  He establish care with [place] in November 2024 when he presented to [facility] with COPD and HFrEF exacerbation.  During that admission echocardiogram with EF of 30%.  Underwent right and left heart catheterization which demonstrated minimal nonobstructive CAD and a Fick cardiac index of 1.8 L/min/m.  He was diuresed, started on GDMT and discharged home.   Interval history Since [facility] from the [facility] reports significant improvement in symptoms of dyspnea, PND and orthopnea.  He has been compliant with all medications.  Reports no lightheadedness.  He has had worsening lower extremity edema over the past 3 to 4 days.   Activity level/exercise tolerance:  NYHA IIB Orthopnea:  Sleeps on 2-3 pillows Paroxysmal noctural dyspnea:  no Chest pain/pressure:  no Orthostatic lightheadedness:  no Palpitations:  no Lower extremity edema:  Yes, 1-2+ to the knees Presyncope/syncope:  no Cough:  no  Past Medical History:  Diagnosis Date   Asthma    CHF (congestive heart failure) (HCC)    CKD (chronic kidney disease)    COPD (chronic obstructive pulmonary disease) (HCC)    HLD (hyperlipidemia)    HTN (hypertension)    MI (myocardial infarction) (HCC)    Sleep apnea     Current Outpatient Medications  Medication Sig Dispense Refill   albuterol (VENTOLIN HFA) 108 (90 Base) MCG/ACT inhaler  Inhale 2 puffs into the lungs every 6 (six) hours as needed for shortness of breath.     amiodarone (PACERONE) 200 MG tablet Take 2 tablets (400 mg total) by mouth 2 (two) times daily for 3 days, THEN 1 tablet (200 mg total) daily. 42 tablet 0   apixaban (ELIQUIS) 5 MG TABS tablet Take 1 tablet (5 mg total) by mouth 2 (two) times daily. 60 tablet 0   atorvastatin (LIPITOR) 80 MG tablet Take 1 tablet (80 mg total) by mouth daily. 30 tablet 0   eplerenone (INSPRA) 50 MG tablet Take 50 mg by mouth daily.     furosemide (LASIX) 40 MG tablet Take 80 mg by mouth daily.     hydrALAZINE (APRESOLINE) 25 MG tablet Take 1 tablet (25 mg total) by mouth 3 (three) times daily. 90 tablet 0   isosorbide mononitrate (IMDUR) 30 MG 24 hr tablet Take 30 mg by mouth daily.     magnesium oxide (MAG-OX) 400 MG tablet Take 400 mg by mouth daily.     potassium chloride (KLOR-CON) 10 MEQ tablet Take 30 mEq by mouth 2 (two) times daily.     No current facility-administered medications for this encounter.    Allergies  Allergen Reactions   Angiotensin Anaphylaxis   Ace Inhibitors Swelling   Lisinopril Swelling    Respiratory arrest   Sertraline Hcl     Lightheaded   Spironolactone     Engorgement of breasts      Social History   Socioeconomic History   Marital status: Significant Other    Spouse name:  Not on file   Number of children: Not on file   Years of education: Not on file   Highest education level: Not on file  Occupational History   Not on file  Tobacco Use   Smoking status: Unknown   Smokeless tobacco: Not on file  Substance and Sexual Activity   Alcohol use: Not Currently   Drug use: Not on file   Sexual activity: Not on file  Other Topics Concern   Not on file  Social History Narrative   Not on file   Social Determinants of Health   Financial Resource Strain: Not on file  Food Insecurity: No Food Insecurity (07/26/2023)   Hunger Vital Sign    Worried About Running Out of Food in  the Last Year: Never true    Ran Out of Food in the Last Year: Never true  Transportation Needs: No Transportation Needs (07/26/2023)   PRAPARE - Administrator, Civil Service (Medical): No    Lack of Transportation (Non-Medical): No  Physical Activity: Not on file  Stress: Not on file  Social Connections: Unknown (04/29/2023)   Received from [facility]   Social Network    Social Network: Not on file  Intimate Partner Violence: Not At Risk (07/26/2023)   Humiliation, Afraid, Rape, and Kick questionnaire    Fear of Current or Ex-Partner: No    Emotionally Abused: No    Physically Abused: No    Sexually Abused: No     No family history on file.  PHYSICAL EXAM: Vitals:   08/07/23 1548  BP: (!) 148/86  Pulse: 66  SpO2: 97%   GENERAL: Well nourished, well developed, and in no apparent distress at rest.  HEENT: Negative for arcus senilis or xanthelasma. There is no scleral icterus.  The mucous membranes are pink and moist.   NECK: Supple, No masses. Normal carotid upstrokes without bruits. No masses or thyromegaly.    CHEST: There are no chest wall deformities. There is no chest wall tenderness. Respirations are unlabored.  Lungs- decreased at bases CARDIAC:  JVP: 7 cm H2O         Normal S1, S2  Normal rate with regular rhythm. No murmurs, rubs or gallops.  Pulses are 2+ and symmetrical in upper and lower extremities. No edema.  ABDOMEN: Soft, non-tender, non-distended. There are no masses or hepatomegaly. There are normal bowel sounds.  EXTREMITIES: Warm and well perfused with no cyanosis, clubbing.  LYMPHATIC: No axillary or supraclavicular lymphadenopathy.  NEUROLOGIC: Patient is oriented x3 with no focal or lateralizing neurologic deficits.  PSYCH: Patients affect is appropriate, there is no evidence of anxiety or depression.  SKIN: Warm and dry; no lesions or wounds.   DATA REVIEW  ECG: 07/27/23: sinus bradycardia   As per my personal  interpretation  ECHO: 07/28/23: LVEF 30%, normal RV function as per my personal interpretation  CATH: 07/29/23: Angiographically minimal CAD in a Right dominant system Borderline Severe Pulmonary Hypertension-Pulmonary Venous Congestion from Combined Systolic and Diastolic Heart Failure Mean PAP 43 to 45 mmHg with PCWP of roughly 38 mmHg and LVEDP of 40 mmHg. Cardiac output-index by Fick 3.62-1.75 As per my personal interpretation  CMR: 08/07/23: 1. Normal LV size with diffuse hypokinesis worse in the septum, LV EF 32%.  2.  Normal RV size and systolic function, EF 60%.  3. Non-coronary LGE pattern. Extensive mid-wall LGE in the basal to mid septum and inferior wall. Consider prior myocarditis, infiltrative disease, or possible familial arrhythmic cardiomyopathy.  No wall thickening, does not appear consistent with hypertrophic cardiomyopathy. T2 is slightly elevated towards the apex but not in the basal to mid LV where there is LGE, probably not active myocarditis. 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 Would consider Cardiac PET evaluation.  ASSESSMENT & PLAN:  Herat failure with reduced EF Etiology of WU:[id number]; LHC as above. 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 Will plan on obtaining cardiac PET. In addition, he had frequent PVCs during his admission.  NYHA class / AHA Stage:NYHA III Volume status & Diuretics: lasix 40mg  BID; hypervolemic on exam; increase lasix to 60mg  BID for 4 days.  Vasodilators:hydralazine 25mg  TID, imdur 30mg  daily; will switch to BIDIL 1 tab TID Beta-Blocker:start coreg 3.125mg  BID in 4 days once euvolemic. FAO:ZHYQMVHQIO 50mg   Cardiometabolic:farxiga 10mg  Devices therapies & Valvulopathies:Escalating GDMT; plan on PET to evaluate for sarcoid prior to ICD placement.  Advanced therapies:Not currently indicated.   2. CKD IIIB - sCr ~1.6-1.7 at  baseline w/ eGFR in the 40s.  - Repeat BMP/BNP  3. NSVT - significant improvement on telemetry at the time of discharge in 11/24 - Will plan on ziopatch at follow up.  - Cardiac PET to rule out sarcoid - continue amiodarone 200mg  daily. Will D/C after ziopatch.   4. Hypertension  - hydralazine 25mg  TID, imdur 30mg  - angioedema with ACEI; will hold off on addition of ARB/ARNI/ACE-I - repeat albs today - start coreg 3.125mg  BID  5. Nonobstructive CAD  - minimal CAD on LHC - continue statin   6. Hyperlipidemia  - LDL elevated at 125 - Lipitor 80mg  daily - repeat lipid panel in 3 months (3/25)  7. COPD  - stable on exam today - followed by pulmonology  - Will plan on PFTs at follow up.   I spent 40 minutes caring for this patient today including face to face time, ordering and reviewing labs, reviewing records from hospitalization in 11/24, seeing the patient, documenting in the record, and arranging follow ups.   [facility] Advanced Heart Failure Mechanical Circulatory Support

## 2023-08-12 ENCOUNTER — Other Ambulatory Visit (HOSPITAL_COMMUNITY): Payer: Self-pay | Admitting: Cardiology

## 2023-08-12 ENCOUNTER — Other Ambulatory Visit (HOSPITAL_COMMUNITY): Payer: Self-pay

## 2023-08-12 MED ORDER — ATORVASTATIN CALCIUM 80 MG PO TABS
80.0000 mg | ORAL_TABLET | Freq: Every day | ORAL | 3 refills | Status: DC
  Filled 2023-08-12: qty 30, 30d supply, fill #0

## 2023-08-12 MED ORDER — CARVEDILOL 3.125 MG PO TABS
3.1250 mg | ORAL_TABLET | Freq: Two times a day (BID) | ORAL | 3 refills | Status: DC
  Filled 2023-08-12: qty 60, 30d supply, fill #0

## 2023-08-12 MED ORDER — POTASSIUM CHLORIDE ER 10 MEQ PO TBCR
40.0000 meq | EXTENDED_RELEASE_TABLET | Freq: Every day | ORAL | 3 refills | Status: DC
  Filled 2023-08-12: qty 120, 30d supply, fill #0

## 2023-08-12 MED ORDER — FUROSEMIDE 40 MG PO TABS
80.0000 mg | ORAL_TABLET | Freq: Every day | ORAL | 3 refills | Status: DC
  Filled 2023-08-12: qty 60, 30d supply, fill #0

## 2023-08-12 MED ORDER — AMIODARONE HCL 200 MG PO TABS
200.0000 mg | ORAL_TABLET | Freq: Every day | ORAL | 3 refills | Status: DC
  Filled 2023-08-12: qty 30, 30d supply, fill #0

## 2023-08-12 MED ORDER — EPLERENONE 50 MG PO TABS
50.0000 mg | ORAL_TABLET | Freq: Every day | ORAL | 3 refills | Status: DC
  Filled 2023-08-12: qty 30, 30d supply, fill #0

## 2023-08-14 ENCOUNTER — Other Ambulatory Visit (HOSPITAL_COMMUNITY): Payer: Self-pay

## 2023-08-19 ENCOUNTER — Inpatient Hospital Stay (HOSPITAL_COMMUNITY)
Admission: RE | Admit: 2023-08-19 | Discharge: 2023-08-19 | Disposition: A | Discharge status: Home / Self Care | Payer: No Typology Code available for payment source | Source: Ambulatory Visit | Attending: Family Medicine | Admitting: Family Medicine

## 2023-08-19 ENCOUNTER — Ambulatory Visit (HOSPITAL_COMMUNITY)
Admission: RE | Admit: 2023-08-19 | Discharge: 2023-08-19 | Disposition: A | Discharge status: Home / Self Care | Payer: Self-pay | Source: Ambulatory Visit | Attending: Family Medicine | Admitting: Family Medicine

## 2023-08-19 ENCOUNTER — Other Ambulatory Visit (HOSPITAL_COMMUNITY): Payer: Self-pay | Admitting: Family Medicine

## 2023-08-19 ENCOUNTER — Other Ambulatory Visit (HOSPITAL_COMMUNITY): Payer: Self-pay

## 2023-08-19 ENCOUNTER — Encounter (HOSPITAL_COMMUNITY): Payer: Self-pay

## 2023-08-19 ENCOUNTER — Other Ambulatory Visit (HOSPITAL_COMMUNITY): Payer: Self-pay | Admitting: *Deleted

## 2023-08-19 VITALS — BP 134/70 | HR 68 | Wt 221.4 lb

## 2023-08-19 DIAGNOSIS — Z7901 Long term (current) use of anticoagulants: Secondary | ICD-10-CM | POA: Insufficient documentation

## 2023-08-19 DIAGNOSIS — N1832 Chronic kidney disease, stage 3b: Secondary | ICD-10-CM

## 2023-08-19 DIAGNOSIS — N529 Male erectile dysfunction, unspecified: Secondary | ICD-10-CM

## 2023-08-19 DIAGNOSIS — I252 Old myocardial infarction: Secondary | ICD-10-CM | POA: Insufficient documentation

## 2023-08-19 DIAGNOSIS — I5022 Chronic systolic (congestive) heart failure: Secondary | ICD-10-CM

## 2023-08-19 DIAGNOSIS — Z79899 Other long term (current) drug therapy: Secondary | ICD-10-CM | POA: Insufficient documentation

## 2023-08-19 DIAGNOSIS — I493 Ventricular premature depolarization: Secondary | ICD-10-CM

## 2023-08-19 DIAGNOSIS — I4891 Unspecified atrial fibrillation: Secondary | ICD-10-CM | POA: Insufficient documentation

## 2023-08-19 DIAGNOSIS — T783XXA Angioneurotic edema, initial encounter: Secondary | ICD-10-CM | POA: Insufficient documentation

## 2023-08-19 DIAGNOSIS — I4729 Other ventricular tachycardia: Secondary | ICD-10-CM

## 2023-08-19 DIAGNOSIS — E782 Mixed hyperlipidemia: Secondary | ICD-10-CM

## 2023-08-19 DIAGNOSIS — G4733 Obstructive sleep apnea (adult) (pediatric): Secondary | ICD-10-CM | POA: Insufficient documentation

## 2023-08-19 DIAGNOSIS — Z7984 Long term (current) use of oral hypoglycemic drugs: Secondary | ICD-10-CM | POA: Insufficient documentation

## 2023-08-19 DIAGNOSIS — Z95810 Presence of automatic (implantable) cardiac defibrillator: Secondary | ICD-10-CM | POA: Insufficient documentation

## 2023-08-19 DIAGNOSIS — I10 Essential (primary) hypertension: Secondary | ICD-10-CM

## 2023-08-19 DIAGNOSIS — J449 Chronic obstructive pulmonary disease, unspecified: Secondary | ICD-10-CM

## 2023-08-19 DIAGNOSIS — J4489 Other specified chronic obstructive pulmonary disease: Secondary | ICD-10-CM | POA: Insufficient documentation

## 2023-08-19 DIAGNOSIS — R6881 Early satiety: Secondary | ICD-10-CM | POA: Insufficient documentation

## 2023-08-19 DIAGNOSIS — E785 Hyperlipidemia, unspecified: Secondary | ICD-10-CM | POA: Insufficient documentation

## 2023-08-19 DIAGNOSIS — I5020 Unspecified systolic (congestive) heart failure: Secondary | ICD-10-CM | POA: Insufficient documentation

## 2023-08-19 DIAGNOSIS — I472 Ventricular tachycardia, unspecified: Secondary | ICD-10-CM | POA: Insufficient documentation

## 2023-08-19 DIAGNOSIS — I2510 Atherosclerotic heart disease of native coronary artery without angina pectoris: Secondary | ICD-10-CM

## 2023-08-19 DIAGNOSIS — I130 Hypertensive heart and chronic kidney disease with heart failure and stage 1 through stage 4 chronic kidney disease, or unspecified chronic kidney disease: Secondary | ICD-10-CM | POA: Insufficient documentation

## 2023-08-19 LAB — BASIC METABOLIC PANEL
Anion gap: 6 (ref 5–15)
BUN: 15 mg/dL (ref 8–23)
CO2: 24 mmol/L (ref 22–32)
Calcium: 9.6 mg/dL (ref 8.9–10.3)
Chloride: 110 mmol/L (ref 98–111)
Creatinine, Ser: 1.77 mg/dL — ABNORMAL HIGH (ref 0.61–1.24)
GFR, Estimated: 42 mL/min — ABNORMAL LOW (ref >60)
Glucose, Bld: 107 mg/dL — ABNORMAL HIGH (ref 70–99)
Potassium: 3.4 mmol/L — ABNORMAL LOW (ref 3.5–5.1)
Sodium: 140 mmol/L (ref 135–145)

## 2023-08-19 LAB — BRAIN NATRIURETIC PEPTIDE: B Natriuretic Peptide: 128.8 pg/mL — ABNORMAL HIGH (ref 0.0–100.0)

## 2023-08-19 MED ORDER — ISOSORB DINITRATE-HYDRALAZINE 20-37.5 MG PO TABS
1.0000 | ORAL_TABLET | Freq: Three times a day (TID) | ORAL | 3 refills | Status: DC
  Filled 2023-08-19: qty 90, 30d supply, fill #0

## 2023-08-19 MED ORDER — FUROSEMIDE 40 MG PO TABS: 80.0000 mg | ORAL_TABLET | Freq: Two times a day (BID) | ORAL | Status: DC

## 2023-08-19 MED ORDER — HYDRALAZINE HCL 25 MG PO TABS
12.5000 mg | ORAL_TABLET | Freq: Three times a day (TID) | ORAL | 3 refills | Status: DC
  Filled 2023-08-19: qty 45, 30d supply, fill #0

## 2023-08-19 MED ORDER — POTASSIUM CHLORIDE ER 10 MEQ PO TBCR: 40.0000 meq | EXTENDED_RELEASE_TABLET | Freq: Two times a day (BID) | ORAL | Status: DC

## 2023-08-19 MED ORDER — ISOSORBIDE MONONITRATE ER 30 MG PO TB24
15.0000 mg | ORAL_TABLET | Freq: Every day | ORAL | 3 refills | Status: DC
  Filled 2023-08-19: qty 15, 30d supply, fill #0

## 2023-08-19 NOTE — Progress Notes (Signed)
ADVANCED HEART FAILURE CLINIC NOTE  PCP: [facility] [place] [facility] Cardiologist: Dr. Gasper Lloyd  HPI: [facility] is a 67 y.o. male hypertension, hyperlipidemia, chronic kidney disease, obstructive sleep apnea, COPD and heart failure with reduced ejection fraction presenting today to establish care  His cardiac history dates back to at least June 2024 when echocardiogram demonstrated EF of 40 to 45%.  He was admitted in August 2024 to the [facility] emergency department with chest pain and shortness of breath, received IV Lasix and was discharged home.  He establish care with [place] in November 2024 when he presented to [facility] with COPD and HFrEF exacerbation.  During that admission echocardiogram with EF of 30%.  Underwent right and left heart catheterization which demonstrated minimal nonobstructive CAD and a Fick cardiac index of 1.8 L/min/m.  He was diuresed, started on GDMT and discharged home.   Follow up 08/07/23, NYHA III and volume overloaded. Lasix was increased to 60 bid x 4 days, beta blocker held until volume better.    Interval history Today he returns for HF follow up with his wife. Overall feeling fine. He has SOB walking further distances on flat ground. Main concern is ED, unresponsive with sildenafil use. He has early satiety. Denies palpitations, CP, dizziness, edema, or PND/Orthopnea. Appetite ok. No fever or chills. Weight at home 208 pounds. Off BiDil, Eliquis, Jardiance, and Coreg x several weeks. Wears CPAP. No tobacco/ETOH/drugs.   Activity level/exercise tolerance:  NYHA II-IIb Orthopnea:  Sleeps on 2-3 pillows Paroxysmal noctural dyspnea: No Chest pain/pressure:  No Orthostatic lightheadedness:  No Palpitations:  No Lower extremity edema:  Yes Presyncope/syncope:  No Cough:  No  Past Medical History:  Diagnosis Date   Asthma    CHF (congestive heart failure) (HCC)    CKD (chronic kidney disease)    COPD (chronic obstructive pulmonary disease) (HCC)    HLD  (hyperlipidemia)    HTN (hypertension)    MI (myocardial infarction) (HCC)    Sleep apnea    Current Outpatient Medications  Medication Sig Dispense Refill   albuterol (VENTOLIN HFA) 108 (90 Base) MCG/ACT inhaler Inhale 2 puffs into the lungs every 6 (six) hours as needed for shortness of breath.     amiodarone (PACERONE) 200 MG tablet Take 1 tablet (200 mg total) by mouth daily. 30 tablet 3   atorvastatin (LIPITOR) 80 MG tablet Take 1 tablet (80 mg total) by mouth daily. 30 tablet 3   eplerenone (INSPRA) 50 MG tablet Take 1 tablet (50 mg total) by mouth daily. 30 tablet 3   furosemide (LASIX) 40 MG tablet Take 2 tablets (80 mg total) by mouth daily. 60 tablet 3   magnesium oxide (MAG-OX) 400 MG tablet Take 400 mg by mouth daily.     potassium chloride (KLOR-CON) 10 MEQ tablet Take 4 tablets (40 mEq total) by mouth daily. 120 tablet 3   apixaban (ELIQUIS) 5 MG TABS tablet Take 1 tablet (5 mg total) by mouth 2 (two) times daily. (Patient not taking: Reported on 08/19/2023) 60 tablet 0   carvedilol (COREG) 3.125 MG tablet Take 1 tablet (3.125 mg total) by mouth 2 (two) times daily. (Patient not taking: Reported on 08/19/2023) 60 tablet 3   empagliflozin (JARDIANCE) 10 MG TABS tablet Take 1 tablet (10 mg total) by mouth daily before breakfast. (Patient not taking: Reported on 08/19/2023) 30 tablet 5   isosorbide-hydrALAZINE (BIDIL) 20-37.5 MG tablet Take 1 tablet by mouth 3 (three) times daily. (Patient not taking: Reported on 08/19/2023)  90 tablet 3   No current facility-administered medications for this encounter.   Allergies  Allergen Reactions   Angiotensin Anaphylaxis   Ace Inhibitors Swelling   Lisinopril Swelling    Respiratory arrest   Sertraline Hcl     Lightheaded   Spironolactone     Engorgement of breasts   Social History   Socioeconomic History   Marital status: Significant Other    Spouse name: Not on file   Number of children: Not on file   Years of education: Not on  file   Highest education level: Not on file  Occupational History   Not on file  Tobacco Use   Smoking status: Unknown   Smokeless tobacco: Not on file  Substance and Sexual Activity   Alcohol use: Not Currently   Drug use: Not on file   Sexual activity: Not on file  Other Topics Concern   Not on file  Social History Narrative   Not on file   Social Drivers of Health   Financial Resource Strain: Not on file  Food Insecurity: No Food Insecurity (07/26/2023)   Hunger Vital Sign    Worried About Running Out of Food in the Last Year: Never true    Ran Out of Food in the Last Year: Never true  Transportation Needs: No Transportation Needs (07/26/2023)   PRAPARE - Administrator, Civil Service (Medical): No    Lack of Transportation (Non-Medical): No  Physical Activity: Not on file  Stress: Not on file  Social Connections: Unknown (04/29/2023)   [facility]   Social Network    Social Network: Not on file  Intimate Partner Violence: Not At Risk (07/26/2023)   Humiliation, Afraid, Rape, and Kick questionnaire    Fear of Current or Ex-Partner: No    Emotionally Abused: No    Physically Abused: No    Sexually Abused: No   No family history on file.  BP 134/70   Pulse 68   Wt 100.4 kg (221 lb 6.4 oz)   SpO2 96%   BMI 35.73 kg/m   Wt Readings from Last 3 Encounters:  08/19/23 100.4 kg (221 lb 6.4 oz)  08/07/23 98.5 kg (217 lb 3.2 oz)  08/02/23 94.3 kg (207 lb 14.4 oz)   PHYSICAL EXAM: General:  NAD. No resp difficulty, walked into clinic HEENT: Normal Neck: Supple. Thick neck, JVP 10. Carotids 2+ bilat; no bruits. No lymphadenopathy or thryomegaly appreciated. Cor: PMI nondisplaced. Regular rate & rhythm. No rubs, gallops or murmurs. Lungs: Clear Abdomen: Soft, nontender, +distended. No hepatosplenomegaly. No bruits or masses. Good bowel sounds. Extremities: No cyanosis, clubbing, rash, 1+ BLE edema Neuro: Alert & oriented x 3, cranial  nerves grossly intact. Moves all 4 extremities w/o difficulty. Affect pleasant.  DATA REVIEW  ReDs: 46%, personally reviewed  ECG: 07/27/23: sinus bradycardia    08/19/23: NSR 61 bpm (personally reviewed)  ECHO: 07/28/23: LVEF 30%, normal RV function   CATH: 07/29/23: Angiographically minimal CAD in a Right dominant system Borderline Severe Pulmonary Hypertension-Pulmonary Venous Congestion from Combined Systolic and Diastolic Heart Failure Mean PAP 43 to 45 mmHg with PCWP of roughly 38 mmHg and LVEDP of 40 mmHg. Cardiac output-index by Fick 3.62-1.75 As per my personal interpretation  CMR: 08/07/23: 1. Normal LV size with diffuse hypokinesis worse in the septum, LVEF 32%.  2. Normal RV size and systolic function, EF 60%.  3. Non-coronary LGE pattern. Extensive mid-wall LGE in the basal to mid septum and inferior  wall. Consider prior myocarditis, infiltrative disease, or possible familial arrhythmic cardiomyopathy. No wall thickening, does not appear consistent with hypertrophic cardiomyopathy. T2 is slightly elevated towards the apex but not in the basal to mid LV where there is LGE, probably not active myocarditis. 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 Would consider Cardiac PET evaluation.  ASSESSMENT & PLAN:  Heart failure with reduced EF Etiology of HF: nonischemic; LHC as above. 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 Will plan on obtaining cardiac PET. In addition, he had frequent PVCs during his admission.  NYHA class / AHA Stage: NYHA II-IIb Volume status & Diuretics: Volume up today, ReDs 46%. Increase Lasix to 80 mg bid, increase KCL to 40 bid. BMET/BNP today, repeat BMET in 10-14 days. Vasodilators: Restart BiDil 1 tab tid. Beta-Blocker: Hold off restarting Coreg until volume better. MRA: Continue eplerenone 50 mg daily. Cardiometabolic: Restart Jardiance 10 mg  daily. Devices therapies & Valvulopathies: Escalating GDMT; plan on PET to evaluate for sarcoid prior to ICD placement.  Advanced therapies: Not currently indicated.   2. CKD IIIb - sCr ~1.6-1.7 at baseline w/ eGFR in the 40s.  - Continue SGLT2i - Labs today  3. AF/NSVT - Significant improvement on telemetry at the time of discharge in 11/24. - Restart Eliquis 5 mg bid. - Continue amiodarone 200 mg daily. Will D/C after ziopatch.  - Place 2 week Zio today.  - Cardiac PET to rule out sarcoid  4. HTN - BP up a bit - angioedema with ACEI; will hold off on addition of ARB/ARNI/ACE-I - Restart BiDil - Labs today  5. Nonobstructive CAD  - Minimal CAD on LHC - No chest pain - Continue statin   6. Hyperlipidemia  - LDL elevated at 125 - Continue Lipitor 80 mg daily - Repeat lipid panel in 3 months (3/25)  7. COPD  - stable on exam today - followed by pulmonology  - Will plan on PFTs at follow up.   8. ED - Defer to PCP vs Urology  Follow up in 4-6 weeks with [facility] clinic.  Prince Rome, FNP-BC 08/19/23

## 2023-08-19 NOTE — Patient Instructions (Addendum)
RESTART Jardiance 10 mg daily  RESTART Eliquis 5mg  twice daily  RESTART Bidil 1 tablet three times daily  REMAIN off of carvedilol  INCREASE Lasix to 80mg  twice daily  INCREASE Potassium to 40mg  twice daily  Follow up in 4-6 weeks with [facility] clinic  Do the following things EVERYDAY: Weigh yourself in the morning before breakfast. Write it down and keep it in a log. Take your medicines as prescribed Eat low salt foods--Limit salt (sodium) to 2000 mg per day.  Stay as active as you can everyday Limit all fluids for the day to less than 2 liters

## 2023-08-19 NOTE — Progress Notes (Signed)
ReDS Vest / Clip - 08/19/23 1000       ReDS Vest / Clip   Station Marker C    Ruler Value 30    ReDS Value Range High volume overload    ReDS Actual Value 46

## 2023-08-20 ENCOUNTER — Telehealth (HOSPITAL_COMMUNITY): Payer: Self-pay | Admitting: Cardiology

## 2023-08-20 ENCOUNTER — Other Ambulatory Visit (HOSPITAL_COMMUNITY): Payer: Self-pay

## 2023-08-20 DIAGNOSIS — I5022 Chronic systolic (congestive) heart failure: Secondary | ICD-10-CM

## 2023-08-20 NOTE — Telephone Encounter (Signed)
-----   Message from Jacklynn Ganong sent at 08/19/2023  2:37 PM EST ----- K is low. Increase K-rich foods in diet. Make sure to take 40 KCL bid.   Repeat BMET in 10 days

## 2023-08-20 NOTE — Telephone Encounter (Signed)
Patient called.  Patient aware.  

## 2023-09-02 ENCOUNTER — Ambulatory Visit (HOSPITAL_COMMUNITY)
Admission: RE | Admit: 2023-09-02 | Discharge: 2023-09-02 | Disposition: A | Discharge status: Home / Self Care | Payer: Self-pay | Source: Ambulatory Visit | Attending: Cardiology | Admitting: Cardiology

## 2023-09-02 DIAGNOSIS — I5022 Chronic systolic (congestive) heart failure: Secondary | ICD-10-CM | POA: Insufficient documentation

## 2023-09-02 LAB — BASIC METABOLIC PANEL
Anion gap: 8 (ref 5–15)
BUN: 15 mg/dL (ref 8–23)
CO2: 27 mmol/L (ref 22–32)
Calcium: 9.6 mg/dL (ref 8.9–10.3)
Chloride: 108 mmol/L (ref 98–111)
Creatinine, Ser: 1.6 mg/dL — ABNORMAL HIGH (ref 0.61–1.24)
GFR, Estimated: 47 mL/min — ABNORMAL LOW (ref >60)
Glucose, Bld: 116 mg/dL — ABNORMAL HIGH (ref 70–99)
Potassium: 3.1 mmol/L — ABNORMAL LOW (ref 3.5–5.1)
Sodium: 143 mmol/L (ref 135–145)

## 2023-09-09 ENCOUNTER — Telehealth (HOSPITAL_COMMUNITY): Payer: Self-pay | Admitting: Cardiology

## 2023-09-09 ENCOUNTER — Other Ambulatory Visit (HOSPITAL_COMMUNITY): Payer: Self-pay

## 2023-09-09 MED ORDER — POTASSIUM CHLORIDE ER 10 MEQ PO TBCR
80.0000 meq | EXTENDED_RELEASE_TABLET | Freq: Two times a day (BID) | ORAL | 3 refills | Status: DC
  Filled 2023-09-09: qty 480, 30d supply, fill #0

## 2023-09-09 NOTE — Telephone Encounter (Signed)
-----   Message from Vance sent at 09/04/2023  8:38 AM EST ----- Renal function stable but K is too low.   Increase KCL to 80 bid, repeat BMET in 1 week

## 2023-09-09 NOTE — Telephone Encounter (Signed)
 Patient called.  Patient aware.

## 2023-09-16 ENCOUNTER — Encounter (HOSPITAL_COMMUNITY): Payer: No Typology Code available for payment source

## 2023-09-19 ENCOUNTER — Other Ambulatory Visit (HOSPITAL_COMMUNITY): Payer: Self-pay

## 2023-09-23 ENCOUNTER — Telehealth (HOSPITAL_COMMUNITY): Payer: Self-pay | Admitting: Cardiology

## 2023-09-23 NOTE — Telephone Encounter (Signed)
Patient called to request samples of jardiance, [place] approval pending   Medication Samples have been provided to the patient.  Drug name: jardiance       Strength: 10mg         Qty: 28  LOT: [id number]  Exp.Date: 07/2025  Dosing instructions: one tab daily  The patient has been instructed regarding the correct time, dose, and frequency of taking this medication, including desired effects and most common side effects.   Magda Bernheim M 12:02 PM 09/23/2023

## 2023-11-25 NOTE — Progress Notes (Signed)
 ADVANCED HEART FAILURE CLINIC NOTE  PCP: [facility] Cardiologist: Dr. Gasper Lloyd  HPI: Kevin Heath is a 68 y.o. male hypertension, hyperlipidemia, chronic kidney disease, obstructive sleep apnea, COPD and heart failure with reduced ejection.  His cardiac history dates back to at least June 2024 when echocardiogram demonstrated EF of 40 to 45%.  He was admitted in August 2024 to the [facility] emergency department with chest pain and shortness of breath, received IV Lasix and was discharged home.  He establish care with [place] in November 2024 when he presented to [facility] with COPD and HFrEF exacerbation.  During that admission echocardiogram with EF of 30%.  Underwent right and left heart catheterization which demonstrated minimal nonobstructive CAD and a Fick cardiac index of 1.8 L/min/m.  He was diuresed, started on GDMT and discharged home.   Zio 1/25: mostly NSR 48 runs of SVT, 2 hrs and 28 mins of AF (2% AF burden), rare PVCs and PACs.  Interval history Today he returns for HF follow up with his wife. Overall feeling poorly. More SOB, abdomen swelling, and orthopnea/PND x 3 weeks. CP x 3 weeks. Feels fatigued. [facility], has not been able to get all of his GDMT through the [place]. Denies palpitations, abnormal bleeding. Appetite ok. No fever or chills. Weight at home 212 pounds. Wears CPAP.  Activity level/exercise tolerance:  NYHA IIIb Orthopnea:  Yes Paroxysmal noctural dyspnea: Yes Chest pain/pressure:  Yes Orthostatic lightheadedness:  No Palpitations:  No Lower extremity edema:  Yes Presyncope/syncope:  No Cough:  No  Past Medical History:  Diagnosis Date   Asthma    CHF (congestive heart failure) (HCC)    CKD (chronic kidney disease)    COPD (chronic obstructive pulmonary disease) (HCC)    HLD (hyperlipidemia)    HTN (hypertension)    MI (myocardial infarction) (HCC)    Sleep apnea    Current Outpatient Medications  Medication Sig Dispense  Refill   albuterol (VENTOLIN HFA) 108 (90 Base) MCG/ACT inhaler Inhale 2 puffs into the lungs every 6 (six) hours as needed for shortness of breath.     apixaban (ELIQUIS) 5 MG TABS tablet Take 1 tablet (5 mg total) by mouth 2 (two) times daily. 60 tablet 0   carvedilol (COREG) 3.125 MG tablet Take 1 tablet (3.125 mg total) by mouth 2 (two) times daily. 60 tablet 3   empagliflozin (JARDIANCE) 10 MG TABS tablet Take 1 tablet (10 mg total) by mouth daily before breakfast. 30 tablet 5   hydrALAZINE (APRESOLINE) 25 MG tablet Take 1/2 tablet (12.5 mg total) by mouth 3 (three) times daily. (Patient taking differently: Take 25 mg by mouth 3 (three) times daily.) 270 tablet 3   amiodarone (PACERONE) 200 MG tablet Take 1 tablet (200 mg total) by mouth daily. (Patient not taking: Reported on 11/26/2023) 30 tablet 3   atorvastatin (LIPITOR) 80 MG tablet Take 1 tablet (80 mg total) by mouth daily. 30 tablet 3   eplerenone (INSPRA) 50 MG tablet Take 1 tablet (50 mg total) by mouth daily. (Patient not taking: Reported on 11/26/2023) 30 tablet 3   furosemide (LASIX) 40 MG tablet Take 2 tablets (80 mg total) by mouth 2 (two) times daily. (Patient taking differently: Take 80 mg by mouth 2 (two) times daily. [facility] to 40 mg bid)     isosorbide mononitrate (IMDUR) 30 MG 24 hr tablet Take 1/2 tablet (15 mg total) by mouth daily. (  Patient not taking: Reported on 11/26/2023) 15 tablet 3   magnesium oxide (MAG-OX) 400 MG tablet Take 400 mg by mouth daily. (Patient not taking: Reported on 11/26/2023)     potassium chloride (KLOR-CON) 10 MEQ tablet Take 8 tablets (80 mEq total) by mouth 2 (two) times daily. (Patient not taking: Reported on 11/26/2023) 480 tablet 3   No current facility-administered medications for this encounter.   Allergies  Allergen Reactions   Angiotensin Anaphylaxis   Ace Inhibitors Swelling   Lisinopril Swelling    Respiratory arrest   Sertraline Hcl     Lightheaded    Spironolactone     Engorgement of breasts   Social History   Socioeconomic History   Marital status: Significant Other    Spouse name: Not on file   Number of children: Not on file   Years of education: Not on file   Highest education level: Not on file  Occupational History   Not on file  Tobacco Use   Smoking status: Unknown   Smokeless tobacco: Not on file  Substance and Sexual Activity   Alcohol use: Not Currently   Drug use: Not on file   Sexual activity: Not on file  Other Topics Concern   Not on file  Social History Narrative   Not on file   Social Drivers of Health   Financial Resource Strain: Not on file  Food Insecurity: No Food Insecurity (07/26/2023)   Hunger Vital Sign    Worried About Running Out of Food in the Last Year: Never true    Ran Out of Food in the Last Year: Never true  Transportation Needs: No Transportation Needs (07/26/2023)   PRAPARE - Administrator, Civil Service (Medical): No    Lack of Transportation (Non-Medical): No  Physical Activity: Not on file  Stress: Not on file  Social Connections: Unknown (04/29/2023)   [facility] from [facility]   Social Network    Social Network: Not on file  Intimate Partner Violence: Not At Risk (07/26/2023)   Humiliation, Afraid, Rape, and Kick questionnaire    Fear of Current or Ex-Partner: No    Emotionally Abused: No    Physically Abused: No    Sexually Abused: No   No family history on file.  BP (!) 166/104   Pulse 63   Wt 93.8 kg (206 lb 12.8 oz)   SpO2 96%   BMI 33.38 kg/m   Wt Readings from Last 3 Encounters:  11/26/23 93.8 kg (206 lb 12.8 oz)  08/19/23 100.4 kg (221 lb 6.4 oz)  08/07/23 98.5 kg (217 lb 3.2 oz)   PHYSICAL EXAM: General:  Walked into clinic, mild conversational dyspnea HEENT: Normal Neck: Supple. Thick neck, JVP 10 Cor: Regular rate & rhythm. No rubs, gallops or murmurs. Lungs: Clear Abdomen: Soft, nontender, +distended.  Extremities: No cyanosis,  clubbing, rash, trace pedal edema Neuro: Alert & oriented x 3, moves all 4 extremities w/o difficulty. Affect pleasant.  DATA REVIEW ReDs reading: 45%, abnormal  ECG: 07/27/23: sinus bradycardia    08/19/23: NSR 61 bpm  11/26/23: NSR 62 bpm  ECHO: 07/28/23: LVEF 30%, normal RV function   CATH: 07/29/23: Angiographically minimal CAD in a Right dominant system Borderline Severe Pulmonary Hypertension-Pulmonary Venous Congestion from Combined Systolic and Diastolic Heart Failure Mean PAP 43 to 45 mmHg with PCWP of roughly 38 mmHg and LVEDP of 40 mmHg. Cardiac output-index by Fick 3.62-1.75 As per my personal interpretation  CMR: 08/07/23: 1. Normal LV size  with diffuse hypokinesis worse in the septum, LVEF 32%.  2. Normal RV size and systolic function, EF 60%.  3. Non-coronary LGE pattern. Extensive mid-wall LGE in the basal to mid septum and inferior wall. Consider prior myocarditis, infiltrative disease, or possible familial arrhythmic cardiomyopathy. No wall thickening, does not appear consistent with hypertrophic cardiomyopathy. T2 is slightly elevated towards the apex but not in the basal to mid LV where there is LGE, probably not active myocarditis. 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 Would consider Cardiac PET evaluation.  ASSESSMENT & PLAN: Heart failure with reduced EF Etiology of HF: nonischemic; LHC as above. 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 Will plan on obtaining cardiac PET. In addition, he had frequent PVCs during his admission.  NYHA class / AHA Stage: NYHA IIIb Volume status & Diuretics: Volume up today, ReDs 45%. Increase Lasix back to 80 mg bid, restart 80 KCL bid. BMET/BNP today  Vasodilators: Continue hydralazine 25 mg tid, restart Imdur 15 mg daily. Beta-Blocker: Continue Coreg 3.125 mg bid MRA: off eplerenone. Plan to restart at next  visit. Cardiometabolic: Continue Jardiance 25 mg daily. Devices therapies & Valvulopathies: restarting GDMT; plan on PET to evaluate for sarcoid prior to ICD placement; order next visit.  Advanced therapies: Not currently indicated.   2. CKD IIIb - sCr ~1.6-1.7 at baseline w/ eGFR in the 40s.  - Continue SGLT2i. - Labs today.  3. AF/NSVT - Significant improvement on telemetry at the time of discharge in 11/24. - Zio 1/25 as above. - Continue Eliquis 5 mg bid. - Has been off amio x weeks. Will keep off for now. - Plan Cardiac PET to rule out sarcoid.  4. HTN - BP elevated. - angioedema with ACEI; will hold off on addition of ARB/ARNI/ACE-I. - Diurese and restart Imdur as above. - Labs today  5. Nonobstructive CAD  - Minimal CAD on LHC - Having CP, ECG ok today, suspect 2/2 to volume. - No ASA with AC. - Continue beta blocker. - Restart statin (see below).  6. Hyperlipidemia  - LDL elevated at 125. - Restart atorvastatin 80 mg daily. - Will need LFTs and lipids in 6-8 weeks.  7. COPD  - stable on exam today. - followed by pulmonology.  - Needs PFTs.   Follow up in 1-2 weeks with APP for fluid and med check (arrange cardiac PET at this visit)  Prince Rome, FNP-BC 11/26/23

## 2023-11-26 ENCOUNTER — Ambulatory Visit (HOSPITAL_COMMUNITY)
Admission: RE | Admit: 2023-11-26 | Discharge: 2023-11-26 | Disposition: A | Discharge status: Home / Self Care | Source: Ambulatory Visit | Attending: Family Medicine | Admitting: Family Medicine

## 2023-11-26 ENCOUNTER — Encounter (HOSPITAL_COMMUNITY): Payer: Self-pay

## 2023-11-26 ENCOUNTER — Other Ambulatory Visit (HOSPITAL_COMMUNITY): Payer: Self-pay

## 2023-11-26 ENCOUNTER — Other Ambulatory Visit: Payer: Self-pay

## 2023-11-26 VITALS — BP 166/104 | HR 63 | Wt 206.8 lb

## 2023-11-26 DIAGNOSIS — Z79899 Other long term (current) drug therapy: Secondary | ICD-10-CM | POA: Insufficient documentation

## 2023-11-26 DIAGNOSIS — Z7901 Long term (current) use of anticoagulants: Secondary | ICD-10-CM | POA: Insufficient documentation

## 2023-11-26 DIAGNOSIS — I493 Ventricular premature depolarization: Secondary | ICD-10-CM | POA: Insufficient documentation

## 2023-11-26 DIAGNOSIS — I480 Paroxysmal atrial fibrillation: Secondary | ICD-10-CM

## 2023-11-26 DIAGNOSIS — I4891 Unspecified atrial fibrillation: Secondary | ICD-10-CM | POA: Insufficient documentation

## 2023-11-26 DIAGNOSIS — J449 Chronic obstructive pulmonary disease, unspecified: Secondary | ICD-10-CM

## 2023-11-26 DIAGNOSIS — E785 Hyperlipidemia, unspecified: Secondary | ICD-10-CM | POA: Insufficient documentation

## 2023-11-26 DIAGNOSIS — I4729 Other ventricular tachycardia: Secondary | ICD-10-CM

## 2023-11-26 DIAGNOSIS — N1832 Chronic kidney disease, stage 3b: Secondary | ICD-10-CM | POA: Insufficient documentation

## 2023-11-26 DIAGNOSIS — I10 Essential (primary) hypertension: Secondary | ICD-10-CM

## 2023-11-26 DIAGNOSIS — I5022 Chronic systolic (congestive) heart failure: Secondary | ICD-10-CM | POA: Insufficient documentation

## 2023-11-26 DIAGNOSIS — G4733 Obstructive sleep apnea (adult) (pediatric): Secondary | ICD-10-CM | POA: Insufficient documentation

## 2023-11-26 DIAGNOSIS — I2510 Atherosclerotic heart disease of native coronary artery without angina pectoris: Secondary | ICD-10-CM | POA: Insufficient documentation

## 2023-11-26 DIAGNOSIS — I130 Hypertensive heart and chronic kidney disease with heart failure and stage 1 through stage 4 chronic kidney disease, or unspecified chronic kidney disease: Secondary | ICD-10-CM | POA: Insufficient documentation

## 2023-11-26 DIAGNOSIS — E782 Mixed hyperlipidemia: Secondary | ICD-10-CM

## 2023-11-26 LAB — CBC
HCT: 47.7 % (ref 39.0–52.0)
Hemoglobin: 15 g/dL (ref 13.0–17.0)
MCH: 28.4 pg (ref 26.0–34.0)
MCHC: 31.4 g/dL (ref 30.0–36.0)
MCV: 90.2 fL (ref 80.0–100.0)
Platelets: 249 10*3/uL (ref 150–400)
RBC: 5.29 MIL/uL (ref 4.22–5.81)
RDW: 13.4 % (ref 11.5–15.5)
WBC: 9.7 10*3/uL (ref 4.0–10.5)
nRBC: 0 % (ref 0.0–0.2)

## 2023-11-26 LAB — BASIC METABOLIC PANEL
Anion gap: 8 (ref 5–15)
BUN: 18 mg/dL (ref 8–23)
CO2: 26 mmol/L (ref 22–32)
Calcium: 10.4 mg/dL — ABNORMAL HIGH (ref 8.9–10.3)
Chloride: 106 mmol/L (ref 98–111)
Creatinine, Ser: 1.65 mg/dL — ABNORMAL HIGH (ref 0.61–1.24)
GFR, Estimated: 46 mL/min — ABNORMAL LOW (ref >60)
Glucose, Bld: 107 mg/dL — ABNORMAL HIGH (ref 70–99)
Potassium: 4.3 mmol/L (ref 3.5–5.1)
Sodium: 140 mmol/L (ref 135–145)

## 2023-11-26 LAB — BRAIN NATRIURETIC PEPTIDE: B Natriuretic Peptide: 291.8 pg/mL — ABNORMAL HIGH (ref 0.0–100.0)

## 2023-11-26 MED ORDER — ISOSORBIDE MONONITRATE ER 30 MG PO TB24
15.0000 mg | ORAL_TABLET | Freq: Every day | ORAL | 1 refills | Status: DC
Start: 2023-11-26 — End: 2024-05-06
  Filled 2023-11-26: qty 15, 30d supply, fill #0

## 2023-11-26 MED ORDER — ISOSORBIDE MONONITRATE ER 30 MG PO TB24
15.0000 mg | ORAL_TABLET | Freq: Every day | ORAL | 1 refills | Status: DC
  Filled 2023-11-26: qty 15, 30d supply, fill #0

## 2023-11-26 MED ORDER — FUROSEMIDE 40 MG PO TABS: 80.0000 mg | ORAL_TABLET | Freq: Two times a day (BID) | ORAL | 1 refills | Status: DC

## 2023-11-26 NOTE — Addendum Note (Signed)
 Encounter addended by: Burna Sis, LCSW on: 11/26/2023 11:12 AM  Actions taken: Clinical Note Signed

## 2023-11-26 NOTE — Progress Notes (Signed)
 H&V Care Navigation CSW Progress Note  Clinical Social Worker spoke with pt and pt wife regarding issue with obtaining medications through [place].  They think that PCP is trying to get them approved for [facility] to go to outpatient pharmacy but unsure bout the details of this have not heard that was an option in the past.  [facility]- confirmed pt PCP is Dr. Nada Boozer- able to get fax number to clinic for RN staff to send recommendations to PCP office as they require PCP sign off on all med changes before pharmacy will fill.  No further needs at this time   SDOH Screenings   Food Insecurity: No Food Insecurity (07/26/2023)  Housing: Low Risk  (07/26/2023)  Transportation Needs: No Transportation Needs (07/26/2023)  Utilities: Not At Risk (07/26/2023)  Social Connections: Unknown (04/29/2023)   Received from [facility]    Burna Sis, LCSW Clinical Social Worker Advanced [facility] Failure Clinic Desk#: [phone] Cell#: [phone]

## 2023-11-26 NOTE — Patient Instructions (Signed)
 Medication Changes:  RESTART ATROVASTATIN 80MG  ONCE DAILY   STOP TAKING AMIODARONE   INCREASE LASIX (FUROSEMIDE)  80MG  TWICE DAILY   RESTART: POTASSIUM (8) TABLETS TWICE DAILY   RESTART ISOSORBIDE 15MG  ONCE DAILY   STOP TAKING EPLERENONE   Lab Work:  Labs done today, your results will be available in MyChart, we will contact you for abnormal readings.  Follow-Up in: 2 WEEKS AS SCHEDULED WITH APP   At the Advanced [facility] Failure Clinic, you and your health needs are our priority. We have a designated team specialized in the treatment of Heart Failure. This Care Team includes your primary Heart Failure Specialized Cardiologist (physician), Advanced Practice Providers (APPs- Physician Assistants and Nurse Practitioners), and Pharmacist who all work together to provide you with the care you need, when you need it.   You may see any of the following providers on your designated Care Team at your next follow up:  Dr. Arvilla Meres Dr. Marca Ancona Dr. Dorthula Nettles Dr. Theresia Bough Tonye Becket, NP Robbie Lis, [place] [facility], [place] Brynda Peon, NP [place] Lee, NP Karle Plumber, PharmD   Please be sure to bring in all your medications bottles to every appointment.   Need to Contact us:  If you have any questions or concerns before your next appointment please send [place] a message through [place] or call our office at [phone].    TO LEAVE A MESSAGE FOR THE NURSE SELECT OPTION 2, PLEASE LEAVE A MESSAGE INCLUDING: YOUR NAME DATE OF BIRTH CALL BACK NUMBER REASON FOR CALL**this is important as we prioritize the call backs  YOU WILL RECEIVE A CALL BACK THE SAME DAY AS LONG AS YOU CALL BEFORE 4:00 PM

## 2023-11-26 NOTE — Progress Notes (Signed)
 ReDS Vest / Clip - 11/26/23 0900       ReDS Vest / Clip   Station Marker C    Ruler Value 30    ReDS Value Range High volume overload    ReDS Actual Value 45

## 2023-11-26 NOTE — Progress Notes (Signed)
 [facility] will [facility] emergency refill of Potassium and furosemide- can not take updated prescription for medications due to approval needing to come from PCP- per LCSW VM left for PCP to request this. Other medications (isosorbide) sent to [facility]

## 2023-11-27 ENCOUNTER — Encounter (HOSPITAL_COMMUNITY)

## 2023-12-08 ENCOUNTER — Other Ambulatory Visit (HOSPITAL_COMMUNITY): Payer: Self-pay

## 2023-12-10 NOTE — Progress Notes (Incomplete)
 ADVANCED HEART FAILURE CLINIC NOTE  PCP: [facility] Cardiologist: Dr. Gasper Lloyd  HPI: Kevin Heath is a 68 y.o. male hypertension, hyperlipidemia, chronic kidney disease, obstructive sleep apnea, COPD and heart failure with reduced ejection.  His cardiac history dates back to at least June 2024 when echocardiogram demonstrated EF of 40 to 45%.  He was admitted in August 2024 to the [facility] emergency department with chest pain and shortness of breath, received IV Lasix and was discharged home.  He establish care with [place] in November 2024 when he presented to [facility] [place] [facility] with COPD and HFrEF exacerbation.  During that admission echocardiogram with EF of 30%.  Underwent right and left heart catheterization which demonstrated minimal nonobstructive CAD and a Fick cardiac index of 1.8 L/min/m.  He was diuresed, started on GDMT and discharged home.   Zio 1/25: mostly NSR 48 runs of SVT, 2 hrs and 28 mins of AF (2% AF burden), rare PVCs and PACs.  Interval history Today he returns for HF follow up with his wife. Overall feeling poorly. More SOB, abdomen swelling, and orthopnea/PND x 3 weeks. CP x 3 weeks. Feels fatigued. [facility] made several med [facility], has not been able to get all of his GDMT through the [place]. Denies palpitations, abnormal bleeding. Appetite ok. No fever or chills. Weight at home 212 pounds. Wears CPAP.  Activity level/exercise tolerance:  NYHA IIIb Orthopnea:  Yes Paroxysmal noctural dyspnea: Yes Chest pain/pressure:  Yes Orthostatic lightheadedness:  No Palpitations:  No Lower extremity edema:  Yes Presyncope/syncope:  No Cough:  No  Past Medical History:  Diagnosis Date   Asthma    CHF (congestive heart failure) (HCC)    CKD (chronic kidney disease)    COPD (chronic obstructive pulmonary disease) (HCC)    HLD (hyperlipidemia)    HTN (hypertension)    MI (myocardial infarction) (HCC)    Sleep apnea    Current Outpatient Medications  Medication Sig Dispense  Refill   albuterol (VENTOLIN HFA) 108 (90 Base) MCG/ACT inhaler Inhale 2 puffs into the lungs every 6 (six) hours as needed for shortness of breath.     apixaban (ELIQUIS) 5 MG TABS tablet Take 1 tablet (5 mg total) by mouth 2 (two) times daily. 60 tablet 0   atorvastatin (LIPITOR) 80 MG tablet Take 1 tablet (80 mg total) by mouth daily. 30 tablet 3   carvedilol (COREG) 3.125 MG tablet Take 1 tablet (3.125 mg total) by mouth 2 (two) times daily. 60 tablet 3   empagliflozin (JARDIANCE) 10 MG TABS tablet Take 1 tablet (10 mg total) by mouth daily before breakfast. (Patient taking differently: Take 25 mg by mouth daily before breakfast.) 30 tablet 5   furosemide (LASIX) 40 MG tablet Take 2 tablets (80 mg total) by mouth 2 (two) times daily. 120 tablet 1   hydrALAZINE (APRESOLINE) 25 MG tablet Take 1/2 tablet (12.5 mg total) by mouth 3 (three) times daily. (Patient taking differently: Take 25 mg by mouth 3 (three) times daily.) 270 tablet 3   isosorbide mononitrate (IMDUR) 30 MG 24 hr tablet Take 1/2 tablet (15 mg total) by mouth daily. 15 tablet 1   magnesium oxide (MAG-OX) 400 MG tablet Take 400 mg by mouth daily. (Patient not taking: Reported on 11/26/2023)     potassium chloride (KLOR-CON) 10 MEQ tablet Take 8 tablets (80 mEq total) by mouth 2 (two) times daily. (Patient not taking: Reported on 11/26/2023) 480 tablet 3   No current facility-administered medications for this  visit.   Allergies  Allergen Reactions   Angiotensin Anaphylaxis   Ace Inhibitors Swelling   Lisinopril Swelling    Respiratory arrest   Sertraline Hcl     Lightheaded   Spironolactone     Engorgement of breasts   Social History   Socioeconomic History   Marital status: Significant Other    Spouse name: Not on file   Number of children: Not on file   Years of education: Not on file   Highest education level: Not on file  Occupational History   Not on file  Tobacco Use   Smoking status: Unknown   Smokeless  tobacco: Not on file  Substance and Sexual Activity   Alcohol use: Not Currently   Drug use: Not on file   Sexual activity: Not on file  Other Topics Concern   Not on file  Social History Narrative   Not on file   Social Drivers of Health   Financial Resource Strain: Not on file  Food Insecurity: No Food Insecurity (07/26/2023)   Hunger Vital Sign    Worried About Running Out of Food in the Last Year: Never true    Ran Out of Food in the Last Year: Never true  Transportation Needs: No Transportation Needs (07/26/2023)   PRAPARE - Administrator, Civil Service (Medical): No    Lack of Transportation (Non-Medical): No  Physical Activity: Not on file  Stress: Not on file  Social Connections: Unknown (04/29/2023)   Received from [facility]   Social Network    Social Network: Not on file  Intimate Partner Violence: Not At Risk (07/26/2023)   Humiliation, Afraid, Rape, and Kick questionnaire    Fear of Current or Ex-Partner: No    Emotionally Abused: No    Physically Abused: No    Sexually Abused: No   No family history on file.  There were no vitals taken for this visit.  Wt Readings from Last 3 Encounters:  11/26/23 93.8 kg (206 lb 12.8 oz)  08/19/23 100.4 kg (221 lb 6.4 oz)  08/07/23 98.5 kg (217 lb 3.2 oz)   PHYSICAL EXAM: General:  Walked into clinic, mild conversational dyspnea HEENT: Normal Neck: Supple. Thick neck, JVP 10 Cor: Regular rate & rhythm. No rubs, gallops or murmurs. Lungs: Clear Abdomen: Soft, nontender, +distended.  Extremities: No cyanosis, clubbing, rash, trace pedal edema Neuro: Alert & oriented x 3, moves all 4 extremities w/o difficulty. Affect pleasant.  DATA REVIEW ReDs reading: 45%, abnormal  ECG: 07/27/23: sinus bradycardia    08/19/23: NSR 61 bpm  11/26/23: NSR 62 bpm  ECHO: 07/28/23: LVEF 30%, normal RV function   CATH: 07/29/23: Angiographically minimal CAD in a Right dominant system Borderline Severe Pulmonary  Hypertension-Pulmonary Venous Congestion from Combined Systolic and Diastolic Heart Failure Mean PAP 43 to 45 mmHg with PCWP of roughly 38 mmHg and LVEDP of 40 mmHg. Cardiac output-index by Fick 3.62-1.75 As per my personal interpretation  CMR: 08/07/23: 1. Normal LV size with diffuse hypokinesis worse in the septum, LVEF 32%.  2. Normal RV size and systolic function, EF 60%.  3. Non-coronary LGE pattern. Extensive mid-wall LGE in the basal to mid septum and inferior wall. Consider prior myocarditis, infiltrative disease, or possible familial arrhythmic cardiomyopathy. No wall thickening, does not appear consistent with hypertrophic cardiomyopathy. T2 is slightly elevated towards the apex but not in the basal to mid LV where there is LGE, probably not active myocarditis. Basal septal involvement by non-coronary LGE raises  concern for cardiac sarcoidosis, especially in an African-American. Would consider Cardiac PET evaluation.  ASSESSMENT & PLAN: Heart failure with reduced EF Etiology of HF: nonischemic; LHC as above. 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 Will plan on obtaining cardiac PET. In addition, he had frequent PVCs during his admission.  NYHA class / AHA Stage: NYHA IIIb Volume status & Diuretics: Volume up today, ReDs 45%. Increase Lasix back to 80 mg bid, restart 80 KCL bid. BMET/BNP today  Vasodilators: Continue hydralazine 25 mg tid, restart Imdur 15 mg daily. Beta-Blocker: Continue Coreg 3.125 mg bid MRA: off eplerenone. Plan to restart at next visit. Cardiometabolic: Continue Jardiance 25 mg daily. Devices therapies & Valvulopathies: restarting GDMT; plan on PET to evaluate for sarcoid prior to ICD placement; order next visit.  Advanced therapies: Not currently indicated.   2. CKD IIIb - sCr ~1.6-1.7 at baseline w/ eGFR in the 40s.  - Continue SGLT2i. - Labs today.  3. AF/NSVT - Significant improvement on telemetry at the time of discharge in  11/24. - Zio 1/25 as above. - Continue Eliquis 5 mg bid. - Has been off amio x weeks. Will keep off for now. - Plan Cardiac PET to rule out sarcoid.  4. HTN - BP elevated. - angioedema with ACEI; will hold off on addition of ARB/ARNI/ACE-I. - Diurese and restart Imdur as above. - Labs today  5. Nonobstructive CAD  - Minimal CAD on LHC - Having CP, ECG ok today, suspect 2/2 to volume. - No ASA with AC. - Continue beta blocker. - Restart statin (see below).  6. Hyperlipidemia  - LDL elevated at 125. - Restart atorvastatin 80 mg daily. - Will need LFTs and lipids in 6-8 weeks.  7. COPD  - stable on exam today. - followed by pulmonology.  - Needs PFTs.   Follow up in 1-2 weeks with APP for fluid and med check (arrange cardiac PET at this visit)  Kevin Rome, FNP-BC 12/10/23

## 2023-12-12 ENCOUNTER — Ambulatory Visit (HOSPITAL_COMMUNITY)
Admission: RE | Admit: 2023-12-12 | Discharge: 2023-12-12 | Disposition: A | Discharge status: Home / Self Care | Payer: Self-pay | Source: Ambulatory Visit | Attending: Family Medicine | Admitting: Family Medicine

## 2023-12-12 ENCOUNTER — Encounter (HOSPITAL_COMMUNITY): Payer: Self-pay

## 2023-12-12 VITALS — BP 130/82 | HR 71 | Wt 211.4 lb

## 2023-12-12 DIAGNOSIS — I2510 Atherosclerotic heart disease of native coronary artery without angina pectoris: Secondary | ICD-10-CM | POA: Insufficient documentation

## 2023-12-12 DIAGNOSIS — E782 Mixed hyperlipidemia: Secondary | ICD-10-CM

## 2023-12-12 DIAGNOSIS — J449 Chronic obstructive pulmonary disease, unspecified: Secondary | ICD-10-CM | POA: Insufficient documentation

## 2023-12-12 DIAGNOSIS — G4733 Obstructive sleep apnea (adult) (pediatric): Secondary | ICD-10-CM | POA: Insufficient documentation

## 2023-12-12 DIAGNOSIS — I10 Essential (primary) hypertension: Secondary | ICD-10-CM

## 2023-12-12 DIAGNOSIS — I480 Paroxysmal atrial fibrillation: Secondary | ICD-10-CM

## 2023-12-12 DIAGNOSIS — I4891 Unspecified atrial fibrillation: Secondary | ICD-10-CM | POA: Insufficient documentation

## 2023-12-12 DIAGNOSIS — Z7901 Long term (current) use of anticoagulants: Secondary | ICD-10-CM | POA: Insufficient documentation

## 2023-12-12 DIAGNOSIS — I130 Hypertensive heart and chronic kidney disease with heart failure and stage 1 through stage 4 chronic kidney disease, or unspecified chronic kidney disease: Secondary | ICD-10-CM | POA: Insufficient documentation

## 2023-12-12 DIAGNOSIS — E785 Hyperlipidemia, unspecified: Secondary | ICD-10-CM | POA: Insufficient documentation

## 2023-12-12 DIAGNOSIS — I5022 Chronic systolic (congestive) heart failure: Secondary | ICD-10-CM | POA: Insufficient documentation

## 2023-12-12 DIAGNOSIS — D869 Sarcoidosis, unspecified: Secondary | ICD-10-CM

## 2023-12-12 DIAGNOSIS — I4729 Other ventricular tachycardia: Secondary | ICD-10-CM | POA: Insufficient documentation

## 2023-12-12 DIAGNOSIS — N1832 Chronic kidney disease, stage 3b: Secondary | ICD-10-CM | POA: Insufficient documentation

## 2023-12-12 LAB — BASIC METABOLIC PANEL WITH GFR
Anion gap: 10 (ref 5–15)
BUN: 16 mg/dL (ref 8–23)
CO2: 26 mmol/L (ref 22–32)
Calcium: 9.9 mg/dL (ref 8.9–10.3)
Chloride: 107 mmol/L (ref 98–111)
Creatinine, Ser: 1.85 mg/dL — ABNORMAL HIGH (ref 0.61–1.24)
GFR, Estimated: 40 mL/min — ABNORMAL LOW (ref >60)
Glucose, Bld: 158 mg/dL — ABNORMAL HIGH (ref 70–99)
Potassium: 3.1 mmol/L — ABNORMAL LOW (ref 3.5–5.1)
Sodium: 143 mmol/L (ref 135–145)

## 2023-12-12 LAB — BRAIN NATRIURETIC PEPTIDE: B Natriuretic Peptide: 501.1 pg/mL — ABNORMAL HIGH (ref 0.0–100.0)

## 2023-12-12 MED ORDER — POTASSIUM CHLORIDE ER 10 MEQ PO TBCR: 40.0000 meq | EXTENDED_RELEASE_TABLET | Freq: Every day | ORAL | 3 refills | Status: DC

## 2023-12-12 MED ORDER — EPLERENONE 25 MG PO TABS: 25.0000 mg | ORAL_TABLET | Freq: Every day | ORAL | 4 refills | Status: DC

## 2023-12-12 NOTE — Progress Notes (Signed)
 ReDS Vest / Clip - 12/12/23 1000       ReDS Vest / Clip   Station Marker C    Ruler Value 34    ReDS Value Range Moderate volume overload    ReDS Actual Value 38

## 2023-12-12 NOTE — Patient Instructions (Addendum)
 Good to see you today!  START Eplerenone 25 mg daily  DECREASE Potassium to 40 meq daily    Please report to Radiology at the [facility] Main Entrance 30 minutes early for your test.  [address] [place], [place] [address]                         OR   Please report to Radiology at [facility] Main Entrance, medical mall, 30 mins prior to your test.  [address]  [place], [place]  How to Prepare for Your Cardiac PET/CT Stress Test:  Nothing to eat or drink, except water, 3 hours prior to arrival time.  NO caffeine/decaffeinated products, or chocolate 12 hours prior to arrival. (Please note decaffeinated beverages (teas/coffees) still contain caffeine).  If you have caffeine within 12 hours prior, the test will need to be rescheduled.  Medication instructions: Do not take erectile dysfunction medications for 72 hours prior to test (sildenafil, tadalafil) Do not take nitrates (isosorbide mononitrate, Ranexa) the day before or day of test Do not take tamsulosin the day before or morning of test Hold theophylline containing medications for 12 hours. Hold Dipyridamole 48 hours prior to the test.  Diabetic Preparation: If able to eat breakfast prior to 3 hour fasting, you may take all medications, including your insulin. Do not worry if you miss your breakfast dose of insulin - start at your next meal. If you do not eat prior to 3 hour fast-Hold all diabetes (oral and insulin) medications. Patients who wear a continuous glucose monitor MUST remove the device prior to scanning.  You may take your remaining medications with water.  NO perfume, cologne or lotion on chest or abdomen area. FEMALES - Please avoid wearing dresses to this appointment.  Total time is 1 to 2 hours; you may want to bring reading material for the waiting time.  IF YOU THINK YOU MAY BE PREGNANT, OR ARE NURSING PLEASE INFORM THE TECHNOLOGIST.  In preparation for  your appointment, medication and supplies will be purchased.  Appointment availability is limited, so if you need to cancel or reschedule, please call the Radiology Department Scheduler at [phone] 24 hours in advance to avoid a cancellation fee of $100.00  What to Expect When you Arrive:  Once you arrive and check in for your appointment, you will be taken to a preparation room within the Radiology Department.  A technologist or Nurse will obtain your medical history, verify that you are correctly prepped for the exam, and explain the procedure.  Afterwards, an IV will be started in your arm and electrodes will be placed on your skin for EKG monitoring during the stress portion of the exam. Then you will be escorted to the PET/CT scanner.  There, staff will get you positioned on the scanner and obtain a blood pressure and EKG.  During the exam, you will continue to be connected to the EKG and blood pressure machines.  A small, safe amount of a radioactive tracer will be injected in your IV to obtain a series of pictures of your heart along with an injection of a stress agent.    After your Exam:  It is recommended that you eat a meal and drink a caffeinated beverage to counter act any effects of the stress agent.  Drink plenty of fluids for the remainder of the day and urinate frequently for the first couple of hours after the exam.  Your doctor will inform you of your test results within 7-10 business days.  For more information and frequently asked questions, please visit our website: [email or web address]  For questions about your test or how to prepare for your test, please call: Cardiac Imaging Nurse Navigators Office: [phone]  Labs done today, your results will be available in MyChart, we will contact you for abnormal readings.   Your physician recommends that you schedule a follow-up appointment  3 months(July ) Call office in May to schedule an appointment  If you  have any questions or concerns before your next appointment please send [place] a message through [place] or call our office at [phone].    TO LEAVE A MESSAGE FOR THE NURSE SELECT OPTION 2, PLEASE LEAVE A MESSAGE INCLUDING: YOUR NAME DATE OF BIRTH CALL BACK NUMBER REASON FOR CALL**this is important as we prioritize the call backs  YOU WILL RECEIVE A CALL BACK THE SAME DAY AS LONG AS YOU CALL BEFORE 4:00 PM At the [facility], you and your health needs are our priority. As part of our continuing mission to provide you with exceptional heart care, we have created designated Provider Care Teams. These Care Teams include your primary Cardiologist (physician) and Advanced Practice Providers (APPs- Physician Assistants and Nurse Practitioners) who all work together to provide you with the care you need, when you need it.   You may see any of the following providers on your designated Care Team at your next follow up: Dr Arvilla Meres Dr Marca Ancona Dr. Dorthula Nettles Dr. Clearnce Hasten Amy Filbert Schilder, NP Robbie Lis, [place] [facility] [place], [place] Brynda Peon, NP [place] Lee, NP Clarisa Kindred, NP Karle Plumber, PharmD Enos Fling, PharmD   Please be sure to bring in all your medications bottles to every appointment.    Thank you for choosing [facility]

## 2023-12-15 ENCOUNTER — Telehealth (HOSPITAL_COMMUNITY): Payer: Self-pay

## 2023-12-15 MED ORDER — FUROSEMIDE 40 MG PO TABS: 100.0000 mg | ORAL_TABLET | Freq: Two times a day (BID) | ORAL | Status: DC

## 2023-12-15 MED ORDER — POTASSIUM CHLORIDE ER 10 MEQ PO TBCR: 80.0000 meq | EXTENDED_RELEASE_TABLET | Freq: Every day | ORAL | Status: DC

## 2023-12-15 NOTE — Telephone Encounter (Signed)
 Spoke with patient regarding the following results. Patient made aware and patient verbalized understanding.   Medication list updated. Advised patient to call back to office with any issues, questions, or concerns. Patient verbalized understanding.

## 2023-12-15 NOTE — Telephone Encounter (Signed)
-----   Message from Elmarie Hacking sent at 12/12/2023 11:58 AM EDT ----- Disregard previous lasix instructions. Increase Lasix to 100 mg bid, keep KCL at 80 daily.

## 2023-12-19 ENCOUNTER — Ambulatory Visit (HOSPITAL_COMMUNITY)
Admission: RE | Admit: 2023-12-19 | Discharge: 2023-12-19 | Disposition: A | Discharge status: Home / Self Care | Source: Ambulatory Visit | Attending: Cardiology | Admitting: Cardiology

## 2023-12-19 DIAGNOSIS — I5022 Chronic systolic (congestive) heart failure: Secondary | ICD-10-CM | POA: Insufficient documentation

## 2023-12-19 LAB — BASIC METABOLIC PANEL WITH GFR
Anion gap: 11 (ref 5–15)
BUN: 15 mg/dL (ref 8–23)
CO2: 26 mmol/L (ref 22–32)
Calcium: 9.9 mg/dL (ref 8.9–10.3)
Chloride: 105 mmol/L (ref 98–111)
Creatinine, Ser: 1.67 mg/dL — ABNORMAL HIGH (ref 0.61–1.24)
GFR, Estimated: 45 mL/min — ABNORMAL LOW (ref >60)
Glucose, Bld: 165 mg/dL — ABNORMAL HIGH (ref 70–99)
Potassium: 3.2 mmol/L — ABNORMAL LOW (ref 3.5–5.1)
Sodium: 142 mmol/L (ref 135–145)

## 2023-12-19 NOTE — Addendum Note (Signed)
 Encounter addended by: Di Jasmer B, RN on: 12/19/2023 9:00 AM  Actions taken: Order list changed, Diagnosis association updated

## 2023-12-22 ENCOUNTER — Encounter (HOSPITAL_COMMUNITY): Payer: Self-pay | Admitting: Emergency Medicine

## 2023-12-23 ENCOUNTER — Telehealth (HOSPITAL_COMMUNITY): Payer: Self-pay | Admitting: *Deleted

## 2023-12-23 DIAGNOSIS — I5022 Chronic systolic (congestive) heart failure: Secondary | ICD-10-CM

## 2023-12-23 MED ORDER — EPLERENONE 25 MG PO TABS: 50.0000 mg | ORAL_TABLET | Freq: Every day | ORAL | 3 refills | Status: DC

## 2023-12-23 NOTE — Telephone Encounter (Signed)
 Called patient per Vernia Good, NP with following:  "K remains low; Please increase Inspra  to 50 mg daily and repeat BMET in 1 week"  Pt verbalized understanding of same. Medication sheet updated, repeat lab ordered and scheduled.

## 2023-12-30 ENCOUNTER — Telehealth (HOSPITAL_COMMUNITY): Payer: Self-pay | Admitting: *Deleted

## 2023-12-30 NOTE — Telephone Encounter (Signed)
 Reaching out to patient to offer assistance regarding upcoming cardiac imaging study; pt verbalizes understanding of appt date/time, parking situation and where to check in, pre-test NPO status; name and call back number provided for further questions should they arise  Larey Brick RN Navigator Cardiac Imaging [facility] [phone] office [phone] cell  Patient verbalized understanding of diet prep.

## 2024-01-01 ENCOUNTER — Encounter (HOSPITAL_COMMUNITY): Admission: RE | Admit: 2024-01-01 | Payer: Self-pay | Source: Ambulatory Visit

## 2024-01-05 ENCOUNTER — Other Ambulatory Visit (HOSPITAL_COMMUNITY)

## 2024-01-12 ENCOUNTER — Inpatient Hospital Stay (HOSPITAL_COMMUNITY)
Admission: EM | Admit: 2024-01-12 | Discharge: 2024-01-14 | DRG: 291 | Disposition: A | Discharge status: Home / Self Care | Attending: Internal Medicine | Admitting: Internal Medicine

## 2024-01-12 ENCOUNTER — Emergency Department (HOSPITAL_COMMUNITY)

## 2024-01-12 DIAGNOSIS — Z6832 Body mass index (BMI) 32.0-32.9, adult: Secondary | ICD-10-CM | POA: Diagnosis not present

## 2024-01-12 DIAGNOSIS — Z7984 Long term (current) use of oral hypoglycemic drugs: Secondary | ICD-10-CM | POA: Diagnosis not present

## 2024-01-12 DIAGNOSIS — E1122 Type 2 diabetes mellitus with diabetic chronic kidney disease: Secondary | ICD-10-CM | POA: Diagnosis present

## 2024-01-12 DIAGNOSIS — I10 Essential (primary) hypertension: Secondary | ICD-10-CM | POA: Diagnosis present

## 2024-01-12 DIAGNOSIS — E785 Hyperlipidemia, unspecified: Secondary | ICD-10-CM | POA: Diagnosis present

## 2024-01-12 DIAGNOSIS — Z7901 Long term (current) use of anticoagulants: Secondary | ICD-10-CM | POA: Diagnosis not present

## 2024-01-12 DIAGNOSIS — N1831 Chronic kidney disease, stage 3a: Secondary | ICD-10-CM | POA: Diagnosis present

## 2024-01-12 DIAGNOSIS — E876 Hypokalemia: Secondary | ICD-10-CM | POA: Diagnosis present

## 2024-01-12 DIAGNOSIS — Z79899 Other long term (current) drug therapy: Secondary | ICD-10-CM | POA: Diagnosis not present

## 2024-01-12 DIAGNOSIS — Z888 Allergy status to other drugs, medicaments and biological substances status: Secondary | ICD-10-CM | POA: Diagnosis not present

## 2024-01-12 DIAGNOSIS — I5043 Acute on chronic combined systolic (congestive) and diastolic (congestive) heart failure: Secondary | ICD-10-CM | POA: Diagnosis present

## 2024-01-12 DIAGNOSIS — I480 Paroxysmal atrial fibrillation: Secondary | ICD-10-CM | POA: Diagnosis present

## 2024-01-12 DIAGNOSIS — I5033 Acute on chronic diastolic (congestive) heart failure: Secondary | ICD-10-CM | POA: Diagnosis not present

## 2024-01-12 DIAGNOSIS — I130 Hypertensive heart and chronic kidney disease with heart failure and stage 1 through stage 4 chronic kidney disease, or unspecified chronic kidney disease: Secondary | ICD-10-CM | POA: Diagnosis present

## 2024-01-12 DIAGNOSIS — I160 Hypertensive urgency: Principal | ICD-10-CM | POA: Diagnosis present

## 2024-01-12 DIAGNOSIS — K219 Gastro-esophageal reflux disease without esophagitis: Secondary | ICD-10-CM | POA: Diagnosis present

## 2024-01-12 DIAGNOSIS — E66811 Obesity, class 1: Secondary | ICD-10-CM | POA: Diagnosis present

## 2024-01-12 DIAGNOSIS — Z1152 Encounter for screening for COVID-19: Secondary | ICD-10-CM | POA: Diagnosis not present

## 2024-01-12 DIAGNOSIS — J4489 Other specified chronic obstructive pulmonary disease: Secondary | ICD-10-CM | POA: Diagnosis present

## 2024-01-12 DIAGNOSIS — I509 Heart failure, unspecified: Secondary | ICD-10-CM

## 2024-01-12 DIAGNOSIS — I252 Old myocardial infarction: Secondary | ICD-10-CM | POA: Diagnosis not present

## 2024-01-12 LAB — CBC
HCT: 49.7 % (ref 39.0–52.0)
Hemoglobin: 15.1 g/dL (ref 13.0–17.0)
MCH: 27.8 pg (ref 26.0–34.0)
MCHC: 30.4 g/dL (ref 30.0–36.0)
MCV: 91.5 fL (ref 80.0–100.0)
Platelets: 241 10*3/uL (ref 150–400)
RBC: 5.43 MIL/uL (ref 4.22–5.81)
RDW: 13.5 % (ref 11.5–15.5)
WBC: 6.7 10*3/uL (ref 4.0–10.5)
nRBC: 0 % (ref 0.0–0.2)

## 2024-01-12 LAB — BASIC METABOLIC PANEL WITH GFR
Anion gap: 9 (ref 5–15)
BUN: 15 mg/dL (ref 8–23)
CO2: 25 mmol/L (ref 22–32)
Calcium: 9.8 mg/dL (ref 8.9–10.3)
Chloride: 106 mmol/L (ref 98–111)
Creatinine, Ser: 1.74 mg/dL — ABNORMAL HIGH (ref 0.61–1.24)
GFR, Estimated: 43 mL/min — ABNORMAL LOW (ref >60)
Glucose, Bld: 150 mg/dL — ABNORMAL HIGH (ref 70–99)
Potassium: 3.9 mmol/L (ref 3.5–5.1)
Sodium: 140 mmol/L (ref 135–145)

## 2024-01-12 LAB — HEPATIC FUNCTION PANEL
ALT: 23 U/L (ref 0–44)
AST: 23 U/L (ref 15–41)
Albumin: 3.3 g/dL — ABNORMAL LOW (ref 3.5–5.0)
Alkaline Phosphatase: 72 U/L (ref 38–126)
Bilirubin, Direct: 0.2 mg/dL (ref 0.0–0.2)
Indirect Bilirubin: 0.4 mg/dL (ref 0.3–0.9)
Total Bilirubin: 0.6 mg/dL (ref 0.0–1.2)
Total Protein: 6.7 g/dL (ref 6.5–8.1)

## 2024-01-12 LAB — TROPONIN I (HIGH SENSITIVITY)
Troponin I (High Sensitivity): 17 ng/L (ref <18)
Troponin I (High Sensitivity): 19 ng/L — ABNORMAL HIGH (ref <18)

## 2024-01-12 LAB — BRAIN NATRIURETIC PEPTIDE: B Natriuretic Peptide: 281.9 pg/mL — ABNORMAL HIGH (ref 0.0–100.0)

## 2024-01-12 MED ORDER — HYDRALAZINE HCL 20 MG/ML IJ SOLN
10.0000 mg | INTRAMUSCULAR | Status: AC
  Administered 2024-01-12: 10 mg via INTRAVENOUS
  Filled 2024-01-12: qty 1

## 2024-01-12 MED ORDER — ATORVASTATIN CALCIUM 80 MG PO TABS
80.0000 mg | ORAL_TABLET | Freq: Every day | ORAL | Status: DC
  Administered 2024-01-13 – 2024-01-14 (×2): 80 mg via ORAL
  Filled 2024-01-12: qty 2
  Filled 2024-01-12: qty 1

## 2024-01-12 MED ORDER — FUROSEMIDE 10 MG/ML IJ SOLN
80.0000 mg | Freq: Two times a day (BID) | INTRAMUSCULAR | Status: AC
  Administered 2024-01-13 (×2): 80 mg via INTRAVENOUS
  Filled 2024-01-12 (×2): qty 8

## 2024-01-12 MED ORDER — IPRATROPIUM-ALBUTEROL 0.5-2.5 (3) MG/3ML IN SOLN: 3.0000 mL | Freq: Four times a day (QID) | RESPIRATORY_TRACT | Status: DC | PRN

## 2024-01-12 MED ORDER — APIXABAN 5 MG PO TABS
5.0000 mg | ORAL_TABLET | Freq: Two times a day (BID) | ORAL | Status: DC
  Administered 2024-01-12 – 2024-01-14 (×4): 5 mg via ORAL
  Filled 2024-01-12 (×4): qty 1

## 2024-01-12 MED ORDER — ENOXAPARIN SODIUM 40 MG/0.4ML IJ SOSY: 40.0000 mg | PREFILLED_SYRINGE | INTRAMUSCULAR | Status: DC

## 2024-01-12 MED ORDER — CARVEDILOL 3.125 MG PO TABS
3.1250 mg | ORAL_TABLET | Freq: Two times a day (BID) | ORAL | Status: DC
  Administered 2024-01-12 – 2024-01-14 (×4): 3.125 mg via ORAL
  Filled 2024-01-12 (×4): qty 1

## 2024-01-12 MED ORDER — FUROSEMIDE 10 MG/ML IJ SOLN
60.0000 mg | Freq: Once | INTRAMUSCULAR | Status: AC
  Administered 2024-01-12: 60 mg via INTRAVENOUS
  Filled 2024-01-12: qty 6

## 2024-01-12 MED ORDER — FUROSEMIDE 10 MG/ML IJ SOLN: 80.0000 mg | Freq: Two times a day (BID) | INTRAMUSCULAR | Status: DC

## 2024-01-12 NOTE — H&P (Signed)
 History and Physical    Patient: Kevin Heath ZOX:[id number] DOB: 17-Mar-1956 DOA: 01/12/2024 DOS: the patient was seen and examined on 01/12/2024 PCP: Clinic, [facility]  Patient coming from: Home  Chief Complaint:  Chief Complaint  Patient presents with   Shortness of Breath   Chest Pain   Abdominal Pain   HPI: Kevin Heath is a 68 y.o. male with medical history significant of HTN, HLD, HFpEF, COPD, and CKD p/w cp and BLE edema c/f ADHF.  Pt states that he was in his USOH until Sunday evening around 9p when he became SOB while trying to lay flat in his bed. He was unable to ly flat, and instead went to the living room and slept in the chair, where he was up intermittently overnight with SOB. Pt states that he tried to ly down again overnight, but was again unsuccessful. If anything, he remembers being more short of breath the second time. He finally decided to present to the ED this morning around 0800 when his sx didn't improve. Pt states that he takes PO lasix  80mg  BID faithfully, and that he does not add salt to his food; additionally, he cooks most of his meals (w/ Ms. Dash to season), and weighs himself 3 days/week.  In the ED, the patient was noted to have elevated BNP 281, and mildly elevated troponins (from 49 down to 17). Pt admitted to medicine for IV diuresis iso ADHF.  Review of Systems: As mentioned in the history of present illness. All other systems reviewed and are negative. Past Medical History:  Diagnosis Date   Asthma    CHF (congestive heart failure) (HCC)    CKD (chronic kidney disease)    COPD (chronic obstructive pulmonary disease) (HCC)    HLD (hyperlipidemia)    HTN (hypertension)    MI (myocardial infarction) (HCC)    Sleep apnea    Past Surgical History:  Procedure Laterality Date   NO PAST SURGERIES     RIGHT/LEFT HEART CATH AND CORONARY ANGIOGRAPHY N/A 07/29/2023   Procedure: RIGHT/LEFT HEART CATH AND CORONARY ANGIOGRAPHY;  Surgeon:  Arleen Lacer, MD;  Location: [facility] INVASIVE CV LAB;  Service: Cardiovascular;  Laterality: N/A;   Social History:  reports that he does not currently use alcohol. No history on file for tobacco use and drug use.  Allergies  Allergen Reactions   Angiotensin Anaphylaxis   Ace Inhibitors Swelling   Lisinopril Swelling    Respiratory arrest   Sertraline Hcl     Lightheaded   Spironolactone     Engorgement of breasts    No family history on file.  Prior to Admission medications   Medication Sig Start Date End Date Taking? Authorizing Provider  albuterol  (VENTOLIN  HFA) 108 (90 Base) MCG/ACT inhaler Inhale 2 puffs into the lungs every 6 (six) hours as needed for shortness of breath. 03/21/23  Yes [provider]  apixaban  (ELIQUIS ) 5 MG TABS tablet Take 1 tablet (5 mg total) by mouth 2 (two) times daily. 08/02/23 12/11/24 Yes Arrien, Curlee Doss, MD  atorvastatin  (LIPITOR ) 80 MG tablet Take 1 tablet (80 mg total) by mouth daily. 08/12/23 12/11/24 Yes Sabharwal, Aditya, DO  carvedilol  (COREG ) 3.125 MG tablet Take 1 tablet (3.125 mg total) by mouth 2 (two) times daily. 08/12/23  Yes Sabharwal, Aditya, DO  empagliflozin  (JARDIANCE ) 10 MG TABS tablet Take 1 tablet (10 mg total) by mouth daily before breakfast. Patient taking differently: Take 25 mg by mouth daily before breakfast. 08/07/23  Yes  Sabharwal, Aditya, DO  furosemide  (LASIX ) 40 MG tablet Take 2.5 tablets (100 mg total) by mouth 2 (two) times daily. Patient taking differently: Take 80 mg by mouth 2 (two) times daily. 12/15/23  Yes Milford, Arlice Bene, FNP  hydrALAZINE  (APRESOLINE ) 25 MG tablet Take 1/2 tablet (12.5 mg total) by mouth 3 (three) times daily. Patient taking differently: Take 25 mg by mouth 3 (three) times daily. 08/19/23 12/11/24 Yes Milford, Arlice Bene, FNP  isosorbide  mononitrate (IMDUR ) 30 MG 24 hr tablet Take 1/2 tablet (15 mg total) by mouth daily. 11/26/23  Yes Milford, Arlice Bene, FNP  magnesium  oxide (MAG-OX)  400 MG tablet Take 400 mg by mouth daily. 12/19/22  Yes [provider]  potassium chloride  (KLOR-CON ) 10 MEQ tablet Take 8 tablets (80 mEq total) by mouth daily. Patient taking differently: Take 120 mEq by mouth daily. 12/15/23   Elmarie Hacking, FNP    Physical Exam: Vitals:   01/12/24 1200 01/12/24 1425 01/12/24 1430 01/12/24 1509  BP: (!) 162/113 (!) 162/113 (!) 157/96   Pulse: 81  71   Resp: (!) 30  (!) 32   Temp:    98 F (36.7 C)  TempSrc:    Oral  SpO2: 98%  100%   Weight:      Height:       General: Alert, oriented x3, resting comfortably in no acute distress HEENT: EOMI, oropharynx clear, moist mucous membranes, hearing intact Neck: Trachea midline and no gross thyromegaly Respiratory: Lungs clear to auscultation bilaterally with normal respiratory effort; no w/r/r Cardiovascular: Regular rate and rhythm w/o m/r/g Abdomen: Soft, nontender, nondistended. Positive bowel sounds MSK: No obvious joint deformities or swelling Skin: No obvious rashes or lesions Neurologic: Awake, alert, spontaneously moves all extremities, strength intact Psychiatric: Appropriate mood and affect, conversational and cooperative  Data Reviewed:  Lab Results  Component Value Date   WBC 6.7 01/12/2024   HGB 15.1 01/12/2024   HCT 49.7 01/12/2024   MCV 91.5 01/12/2024   PLT 241 01/12/2024   Lab Results  Component Value Date   GLUCOSE 150 (H) 01/12/2024   CALCIUM  9.8 01/12/2024   NA 140 01/12/2024   K 3.9 01/12/2024   CO2 25 01/12/2024   CL 106 01/12/2024   BUN 15 01/12/2024   CREATININE 1.74 (H) 01/12/2024   Lab Results  Component Value Date   ALT 23 01/12/2024   AST 23 01/12/2024   ALKPHOS 72 01/12/2024   BILITOT 0.6 01/12/2024   Lab Results  Component Value Date   INR 1.2 07/27/2023   INR 1.11 10/05/2009    Radiology: DG Chest 2 View Result Date: 01/12/2024 CLINICAL DATA:  Chest pain. EXAM: CHEST - 2 VIEW COMPARISON:  07/26/2023. FINDINGS: Low lung volume.  Mild-to-moderate diffuse pulmonary vascular congestion with bilateral hilar and bibasilar predominance. Bilateral lung fields are otherwise clear. No acute consolidation or lung collapse. Bilateral costophrenic angles are clear. Stable cardio-mediastinal silhouette. No acute osseous abnormalities. The soft tissues are within normal limits. IMPRESSION: *Findings favor congestive heart failure/pulmonary edema. Electronically Signed   By: Beula Brunswick M.D.   On: 01/12/2024 11:21    Assessment and Plan: 67M h/o HTN, HLD, HFpEF, COPD, and CKD p/w cp and BLE edema c/f HFpEF exacerbation.  #Acute on chronic diastolic heart failure #ADHF #HFpEF exacerbation #Elevated BNP Pt presented with cp, SOB, BLE edema, and elevated BNP c/w ADHF. High suspicion for dietary indiscretion despite decent adherence. -RD consulted for low sodium teaching -IV lasix  80mg  BID x 3 doses;  goal net neg 1-2L/d; strict I/Os; daily standing weights; K>4/Mg>2 -Low sodium diet with 1.5L fluid restriction -Ambulatory pulse ox prior to d/c -Obtain dry weight prior to d/c -Consider OP cardiac rehab -F/u OP cardiology as planned  #H/o Afib -PTA apixban 5mg  BID  #H/o COPD No acute flare. -Duonebs prn   Advance Care Planning:   Code Status: Full Code    Consults: N/A  Family Communication: N/A  Severity of Illness: The appropriate patient status for this patient is INPATIENT. Inpatient status is judged to be reasonable and necessary in order to provide the required intensity of service to ensure the patient's safety. The patient's presenting symptoms, physical exam findings, and initial radiographic and laboratory data in the context of their chronic comorbidities is felt to place them at high risk for further clinical deterioration. Furthermore, it is not anticipated that the patient will be medically stable for discharge from the hospital within 2 midnights of admission.   * I certify that at the point of admission it  is my clinical judgment that the patient will require inpatient hospital care spanning beyond 2 midnights from the point of admission due to high intensity of service, high risk for further deterioration and high frequency of surveillance required.*   ------- I spent 55 minutes reviewing previous labs/notes, obtaining separate history at the bedside, counseling/discussing the treatment plan outlined above, ordering medications/tests, and performing clinical documentation.  Author: Arne Langdon, MD 01/12/2024 3:44 PM  For on call review [email or web address].

## 2024-01-12 NOTE — ED Triage Notes (Addendum)
 Pt. Stated, I started having stomach pain like gas and then it sorted went to my chest and I was SOB, I thought it was just gas but getting worse. Pt's O2 sats 91 put on 2 L of Oxygen

## 2024-01-12 NOTE — ED Provider Notes (Signed)
 [facility] Provider Note   CSN: [id number] Arrival date & time: 01/12/24  1015     History  Chief Complaint  Patient presents with   Shortness of Breath   Chest Pain   Abdominal Pain    Kevin Heath is a 68 y.o. male.  HPI Patient arrives with his 2 sisters with a history. Patient has history of multiple medical issues including heart failure, diabetes.  He was well until the past 24 hours when he began having chest tightness and sternal discomfort.  Symptoms have worsened over the last few hours and he presents for evaluation.  No abdominal pain.  Discomfort is in the epigastrium, sternum.  No appreciable new swelling.  He has been taking the medication as directed.    Home Medications Prior to Admission medications   Medication Sig Start Date End Date Taking? Authorizing Provider  albuterol  (VENTOLIN  HFA) 108 (90 Base) MCG/ACT inhaler Inhale 2 puffs into the lungs every 6 (six) hours as needed for shortness of breath. 03/21/23  Yes [provider]  apixaban  (ELIQUIS ) 5 MG TABS tablet Take 1 tablet (5 mg total) by mouth 2 (two) times daily. 08/02/23 12/11/24 Yes Arrien, Curlee Doss, MD  atorvastatin  (LIPITOR ) 80 MG tablet Take 1 tablet (80 mg total) by mouth daily. 08/12/23 12/11/24 Yes Sabharwal, Aditya, DO  carvedilol  (COREG ) 3.125 MG tablet Take 1 tablet (3.125 mg total) by mouth 2 (two) times daily. 08/12/23  Yes Sabharwal, Aditya, DO  empagliflozin  (JARDIANCE ) 10 MG TABS tablet Take 1 tablet (10 mg total) by mouth daily before breakfast. Patient taking differently: Take 25 mg by mouth daily before breakfast. 08/07/23  Yes Sabharwal, Aditya, DO  furosemide  (LASIX ) 40 MG tablet Take 2.5 tablets (100 mg total) by mouth 2 (two) times daily. Patient taking differently: Take 80 mg by mouth 2 (two) times daily. 12/15/23  Yes Milford, Arlice Bene, FNP  hydrALAZINE  (APRESOLINE ) 25 MG tablet Take 1/2 tablet (12.5 mg total) by  mouth 3 (three) times daily. Patient taking differently: Take 25 mg by mouth 3 (three) times daily. 08/19/23 12/11/24 Yes Milford, Arlice Bene, FNP  isosorbide  mononitrate (IMDUR ) 30 MG 24 hr tablet Take 1/2 tablet (15 mg total) by mouth daily. 11/26/23  Yes Milford, Arlice Bene, FNP  magnesium  oxide (MAG-OX) 400 MG tablet Take 400 mg by mouth daily. 12/19/22  Yes [provider]  potassium chloride  (KLOR-CON ) 10 MEQ tablet Take 8 tablets (80 mEq total) by mouth daily. Patient taking differently: Take 120 mEq by mouth daily. 12/15/23  Yes Milford, Arlice Bene, FNP      Allergies    Angiotensin, Ace inhibitors, Lisinopril, Sertraline hcl, and Spironolactone    Review of Systems   Review of Systems  Physical Exam Updated Vital Signs BP (!) 162/113   Pulse 81   Temp 97.8 F (36.6 C)   Resp (!) 30   Ht 5\' 6"  (1.676 m)   Wt 96.2 kg   SpO2 98%   BMI 34.22 kg/m  Physical Exam Vitals and nursing note reviewed.  Constitutional:      General: He is not in acute distress.    Appearance: He is well-developed.  HENT:     Head: Normocephalic and atraumatic.  Eyes:     Conjunctiva/sclera: Conjunctivae normal.  Cardiovascular:     Rate and Rhythm: Normal rate and regular rhythm.  Pulmonary:     Effort: Pulmonary effort is normal. Tachypnea present.     Breath sounds: Decreased  breath sounds present.  Abdominal:     General: There is no distension.  Skin:    General: Skin is warm and dry.  Neurological:     Mental Status: He is alert and oriented to person, place, and time.     ED Results / Procedures / Treatments   Labs (all labs ordered are listed, but only abnormal results are displayed) Labs Reviewed  BASIC METABOLIC PANEL WITH GFR - Abnormal; Notable for the following components:      Result Value   Glucose, Bld 150 (*)    Creatinine, Ser 1.74 (*)    GFR, Estimated 43 (*)    All other components within normal limits  BRAIN NATRIURETIC PEPTIDE - Abnormal; Notable for the  following components:   B Natriuretic Peptide 281.9 (*)    All other components within normal limits  TROPONIN I (HIGH SENSITIVITY) - Abnormal; Notable for the following components:   Troponin I (High Sensitivity) 19 (*)    All other components within normal limits  CBC  HEPATIC FUNCTION PANEL  TROPONIN I (HIGH SENSITIVITY)    EKG EKG Interpretation Date/Time:  Monday Jan 12 2024 10:31:02 EDT Ventricular Rate:  77 PR Interval:  198 QRS Duration:  92 QT Interval:  414 QTC Calculation: 468 R Axis:   -22  Text Interpretation: Normal sinus rhythm Possible Left atrial enlargement Possible Anterior infarct , age undetermined Abnormal ECG Confirmed by Dorenda Gandy [phone]) on 01/12/2024 11:38:06 AM  Radiology DG Chest 2 View Result Date: 01/12/2024 CLINICAL DATA:  Chest pain. EXAM: CHEST - 2 VIEW COMPARISON:  07/26/2023. FINDINGS: Low lung volume. Mild-to-moderate diffuse pulmonary vascular congestion with bilateral hilar and bibasilar predominance. Bilateral lung fields are otherwise clear. No acute consolidation or lung collapse. Bilateral costophrenic angles are clear. Stable cardio-mediastinal silhouette. No acute osseous abnormalities. The soft tissues are within normal limits. IMPRESSION: *Findings favor congestive heart failure/pulmonary edema. Electronically Signed   By: Beula Brunswick M.D.   On: 01/12/2024 11:21    Procedures Procedures    Medications Ordered in ED Medications  furosemide  (LASIX ) injection 60 mg (60 mg Intravenous Given 01/12/24 1424)  hydrALAZINE  (APRESOLINE ) injection 10 mg (10 mg Intravenous Given 01/12/24 1425)    ED Course/ Medical Decision Making/ A&P                                 Medical Decision Making Patient with heart failure, hypertension presents with dyspnea, chest pain.  Patient is awake alert, afebrile, low suspicion for infection.  Suspicion for heart failure exacerbation, ACS, GERD given his history of this as well.  Cardiac 65 sinus  normal pulse ox 98% 2 L nasal cannula abnormal  Amount and/or Complexity of Data Reviewed Independent Historian: spouse External Data Reviewed: notes. Labs: ordered. Decision-making details documented in ED Course. Radiology: independent interpretation performed. Decision-making details documented in ED Course. ECG/medicine tests: independent interpretation performed. Decision-making details documented in ED Course.  Risk Prescription drug management. Decision regarding hospitalization. Diagnosis or treatment significantly limited by social determinants of health.   2:35 PM Findings discussed with the patient and his wife.  With elevated BNP, x-ray suggesting pulmonary edema, and persistent hypertension patient will require hydralazine , IV Lasix .  Patient's renal function is slightly diminished from prior, and given this, nation, heart failure exacerbation, renal dysfunction, patient will be admitted for further monitoring, management.        Final Clinical Impression(s) / ED Diagnoses Final  diagnoses:  Hypertensive urgency  Acute on chronic combined systolic and diastolic congestive heart failure (HCC)     Dorenda Gandy, MD 01/12/24 1436

## 2024-01-13 ENCOUNTER — Encounter (HOSPITAL_COMMUNITY): Payer: Self-pay | Admitting: Hospitalist

## 2024-01-13 ENCOUNTER — Other Ambulatory Visit: Payer: Self-pay

## 2024-01-13 DIAGNOSIS — I5033 Acute on chronic diastolic (congestive) heart failure: Secondary | ICD-10-CM | POA: Diagnosis not present

## 2024-01-13 LAB — BASIC METABOLIC PANEL WITH GFR
Anion gap: 12 (ref 5–15)
BUN: 15 mg/dL (ref 8–23)
CO2: 23 mmol/L (ref 22–32)
Calcium: 9.9 mg/dL (ref 8.9–10.3)
Chloride: 107 mmol/L (ref 98–111)
Creatinine, Ser: 1.63 mg/dL — ABNORMAL HIGH (ref 0.61–1.24)
GFR, Estimated: 46 mL/min — ABNORMAL LOW (ref >60)
Glucose, Bld: 111 mg/dL — ABNORMAL HIGH (ref 70–99)
Potassium: 3.9 mmol/L (ref 3.5–5.1)
Sodium: 142 mmol/L (ref 135–145)

## 2024-01-13 MED ORDER — PANTOPRAZOLE SODIUM 40 MG PO TBEC
40.0000 mg | DELAYED_RELEASE_TABLET | Freq: Every day | ORAL | Status: DC
  Administered 2024-01-13 – 2024-01-14 (×2): 40 mg via ORAL
  Filled 2024-01-13: qty 1
  Filled 2024-01-13: qty 2

## 2024-01-13 MED ORDER — ENSURE ENLIVE PO LIQD
237.0000 mL | Freq: Two times a day (BID) | ORAL | Status: DC
  Administered 2024-01-14: 237 mL via ORAL

## 2024-01-13 MED ORDER — EMPAGLIFLOZIN 10 MG PO TABS
10.0000 mg | ORAL_TABLET | Freq: Every day | ORAL | Status: DC
  Administered 2024-01-13 – 2024-01-14 (×2): 10 mg via ORAL
  Filled 2024-01-13 (×2): qty 1

## 2024-01-13 NOTE — Plan of Care (Signed)
  Problem: Education: Goal: Knowledge of General Education information will improve Description: Including pain rating scale, medication(s)/side effects and non-pharmacologic comfort measures Outcome: Progressing   Problem: Clinical Measurements: Goal: Ability to maintain clinical measurements within normal limits will improve Outcome: Progressing   Problem: Coping: Goal: Level of anxiety will decrease Outcome: Progressing   Problem: Elimination: Goal: Will not experience complications related to bowel motility Outcome: Progressing   

## 2024-01-13 NOTE — Progress Notes (Signed)
 PROGRESS NOTE    Kevin Heath  DGL:[id number] DOB: Aug 19, 1956 DOA: 01/12/2024 PCP: [facility]  67/Mwith history of chronic systolic CHF, HTN, HLD, COPD, and CKD presented to the ED with worsening dyspnea, orthopnea. - Reports compliance with diuretics and trying to do the best he can with diet. - In the ED, hypertensive, labs with BNP 281, troponin 17, 19, creatinine 1.7, hemoglobin 15.1, chest x-ray with CHF, pulmonary edema  Subjective: -Patient seen in the ER, reports feeling better, breathing is starting to improve  Assessment and Plan:  Acute on chronic systolic CHF - Last echo 11/24, cardiac MRI 12/24 - Noted EF of 30--35%, normal RV - Cath 11/24 noted minimal CAD - Urine output not recorded in the ED, still mildly volume overloaded continue IV Lasix  80 Mg twice daily, Coreg , restart Jardiance  -Imdur  and hydralazine  on hold, resume depending on blood pressures in the next 1 to 2 days - History of angioedema with ACEi  Paroxysmal A-fib - Currently in sinus rhythm, continue Coreg , Eliquis  - Amiodarone  was discontinued previously  History of COPD - Stable, DuoNebs as needed   DVT prophylaxis: Apixaban  Code Status: Full code Disposition Plan: Likely 1 to 2 days  Consultants:    Procedures:   Antimicrobials:    Objective: Vitals:   01/13/24 0745 01/13/24 0820 01/13/24 0900 01/13/24 1000  BP:  (!) 145/88 (!) 153/93 129/84  Pulse:  63 61 (!) 58  Resp:   16   Temp: 97.8 F (36.6 C)     TempSrc: Oral     SpO2:  100% 100% 97%  Weight:      Height:        Intake/Output Summary (Last 24 hours) at 01/13/2024 1128 Last data filed at 01/13/2024 5188 Gross per 24 hour  Intake --  Output 2100 ml  Net -2100 ml   Filed Weights   01/12/24 1055  Weight: 96.2 kg    Examination:  General exam: Appears calm and comfortable  HEENT: Positive JVD Respiratory system: Few basilar Rales Cardiovascular system: S1 & S2 heard, RRR.  Abd: nondistended,  soft and nontender.Normal bowel sounds heard. Extremities: 1+ edema Skin: No rashes Psychiatry:  Mood & affect appropriate.     Data Reviewed:   CBC: Recent Labs  Lab 01/12/24 1101  WBC 6.7  HGB 15.1  HCT 49.7  MCV 91.5  PLT 241   Basic Metabolic Panel: Recent Labs  Lab 01/12/24 1101 01/13/24 0715  NA 140 142  K 3.9 3.9  CL 106 107  CO2 25 23  GLUCOSE 150* 111*  BUN 15 15  CREATININE 1.74* 1.63*  CALCIUM  9.8 9.9   GFR: Estimated Creatinine Clearance: 48.4 mL/min (A) (by C-G formula based on SCr of 1.63 mg/dL (H)). Liver Function Tests: Recent Labs  Lab 01/12/24 1430  AST 23  ALT 23  ALKPHOS 72  BILITOT 0.6  PROT 6.7  ALBUMIN 3.3*   No results for input(s): "LIPASE", "AMYLASE" in the last 168 hours. No results for input(s): "AMMONIA" in the last 168 hours. Coagulation Profile: No results for input(s): "INR", "PROTIME" in the last 168 hours. Cardiac Enzymes: No results for input(s): "CKTOTAL", "CKMB", "CKMBINDEX", "TROPONINI" in the last 168 hours. BNP (last 3 results) No results for input(s): "PROBNP" in the last 8760 hours. HbA1C: No results for input(s): "HGBA1C" in the last 72 hours. CBG: No results for input(s): "GLUCAP" in the last 168 hours. Lipid Profile: No results for input(s): "CHOL", "HDL", "LDLCALC", "TRIG", "CHOLHDL", "LDLDIRECT" in the last  72 hours. Thyroid Function Tests: No results for input(s): "TSH", "T4TOTAL", "FREET4", "T3FREE", "THYROIDAB" in the last 72 hours. Anemia Panel: No results for input(s): "VITAMINB12", "FOLATE", "FERRITIN", "TIBC", "IRON", "RETICCTPCT" in the last 72 hours. Urine analysis:    Component Value Date/Time   COLORURINE YELLOW 07/27/2023 0405   APPEARANCEUR CLEAR 07/27/2023 0405   LABSPEC 1.009 07/27/2023 0405   PHURINE 5.0 07/27/2023 0405   GLUCOSEU 150 (A) 07/27/2023 0405   HGBUR NEGATIVE 07/27/2023 0405   BILIRUBINUR NEGATIVE 07/27/2023 0405   KETONESUR 5 (A) 07/27/2023 0405   PROTEINUR 30 (A)  07/27/2023 0405   UROBILINOGEN 0.2 02/13/2010 2057   NITRITE NEGATIVE 07/27/2023 0405   LEUKOCYTESUR NEGATIVE 07/27/2023 0405   Sepsis Labs: @LABRCNTIP (procalcitonin:4,lacticidven:4)  )No results found for this or any previous visit (from the past 240 hours).   Radiology Studies: DG Chest 2 View Result Date: 01/12/2024 CLINICAL DATA:  Chest pain. EXAM: CHEST - 2 VIEW COMPARISON:  07/26/2023. FINDINGS: Low lung volume. Mild-to-moderate diffuse pulmonary vascular congestion with bilateral hilar and bibasilar predominance. Bilateral lung fields are otherwise clear. No acute consolidation or lung collapse. Bilateral costophrenic angles are clear. Stable cardio-mediastinal silhouette. No acute osseous abnormalities. The soft tissues are within normal limits. IMPRESSION: *Findings favor congestive heart failure/pulmonary edema. Electronically Signed   By: Beula Brunswick M.D.   On: 01/12/2024 11:21     Scheduled Meds:  apixaban   5 mg Oral BID   atorvastatin   80 mg Oral Daily   carvedilol   3.125 mg Oral BID   furosemide   80 mg Intravenous BID   Continuous Infusions:   LOS: 1 day    Time spent:    Deforest Fast, MD [facility]   01/13/2024, 11:28 AM

## 2024-01-13 NOTE — Progress Notes (Signed)
 Heart Failure Navigator Progress Note  Assessed for Heart & Vascular TOC clinic readiness.  Patient does not meet criteria due to Advanced Heart Failure Team patient of Dr. Gasper Lloyd.   Navigator will sign off at this time.   Rhae Hammock, BSN, Scientist, clinical (histocompatibility and immunogenetics) Only

## 2024-01-13 NOTE — Progress Notes (Signed)
   01/13/24 2237  BiPAP/CPAP/SIPAP  $ Face Mask Medium Yes  BiPAP/CPAP/SIPAP Pt Type Adult  BiPAP/CPAP/SIPAP Resmed  Mask Type Nasal mask  Mask Size Medium  Respiratory Rate 16 breaths/min  EPAP 10 cmH2O  Patient Home Machine No  Patient Home Mask No  Patient Home Tubing No  Auto Titrate No  Device Plugged into RED Power Outlet Yes  BiPAP/CPAP /SiPAP Vitals  Pulse Rate 60  Resp 16  Bilateral Breath Sounds Clear  MEWS Score/Color  MEWS Score 0  MEWS Score Color Marrie Sizer

## 2024-01-14 DIAGNOSIS — N1831 Chronic kidney disease, stage 3a: Secondary | ICD-10-CM

## 2024-01-14 DIAGNOSIS — I10 Essential (primary) hypertension: Secondary | ICD-10-CM

## 2024-01-14 LAB — BASIC METABOLIC PANEL WITH GFR
Anion gap: 10 (ref 5–15)
BUN: 23 mg/dL (ref 8–23)
CO2: 27 mmol/L (ref 22–32)
Calcium: 10.3 mg/dL (ref 8.9–10.3)
Chloride: 103 mmol/L (ref 98–111)
Creatinine, Ser: 1.82 mg/dL — ABNORMAL HIGH (ref 0.61–1.24)
GFR, Estimated: 40 mL/min — ABNORMAL LOW (ref >60)
Glucose, Bld: 125 mg/dL — ABNORMAL HIGH (ref 70–99)
Potassium: 3.4 mmol/L — ABNORMAL LOW (ref 3.5–5.1)
Sodium: 140 mmol/L (ref 135–145)

## 2024-01-14 MED ORDER — TORSEMIDE 20 MG PO TABS: 60.0000 mg | ORAL_TABLET | Freq: Two times a day (BID) | ORAL | 3 refills | Status: DC

## 2024-01-14 MED ORDER — PANTOPRAZOLE SODIUM 40 MG PO TBEC: 40.0000 mg | DELAYED_RELEASE_TABLET | Freq: Every day | ORAL | 1 refills | Status: DC

## 2024-01-14 MED ORDER — POTASSIUM CHLORIDE CRYS ER 20 MEQ PO TBCR: 40.0000 meq | EXTENDED_RELEASE_TABLET | Freq: Two times a day (BID) | ORAL | 0 refills | Status: DC

## 2024-01-14 MED ORDER — HYDRALAZINE HCL 25 MG PO TABS: 25.0000 mg | ORAL_TABLET | Freq: Two times a day (BID) | ORAL | Status: DC

## 2024-01-14 MED ORDER — POTASSIUM CHLORIDE CRYS ER 20 MEQ PO TBCR
40.0000 meq | EXTENDED_RELEASE_TABLET | Freq: Once | ORAL | Status: AC
  Administered 2024-01-14: 40 meq via ORAL
  Filled 2024-01-14: qty 2

## 2024-01-14 NOTE — Discharge Instructions (Signed)

## 2024-01-14 NOTE — TOC CM/SW Note (Signed)
 Transition of Care [facility]) - Inpatient Brief Assessment   Patient Details  Name: Kevin Heath MRN: [id number] Date of Birth: 1956/06/01  Transition of Care [facility]) CM/SW Contact:    Jennett Model, RN Phone Number: 01/14/2024, 1:38 PM   Clinical Narrative: From home with spouse, has PCP  with [facility] , Dr. Dasie Epps and insurance on file, states has no HH services in place at this time or DME at home.  States family member will transport them home at [employer] and family is support system, states gets medications from [facility] and [address]  Pta self ambulatory.   Transition of Care Asessment: Insurance and Status: Insurance coverage has been reviewed Patient has primary care physician: Yes Home environment has been reviewed: home with wife Prior level of function:: indep Prior/Current Home Services: No current home services Social Drivers of Health Review: SDOH reviewed no interventions necessary Readmission risk has been reviewed: Yes Transition of care needs: no transition of care needs at this time

## 2024-01-14 NOTE — Progress Notes (Signed)
 Reviewed AVS, patient expressed understanding of medications, MD follow up reviewed.   Removed IV, Site clean, dry and intact.  Patient states all belongings brought to the hospital at time of admission are accounted for and packed to take home.  Patient informed and expressed understanding of where to pick up prescriptions Pt transported to entrance A where family member was waiting in vehicle to transport home.

## 2024-01-14 NOTE — TOC Transition Note (Addendum)
 Transition of Care [facility]) - Discharge Note   Patient Details  Name: Kevin Heath MRN: [id number] Date of Birth: 1956/02/15  Transition [facility] Care [facility]) CM/SW Contact:  Jennett Model, RN Phone Number: 01/14/2024, 1:40 PM   Clinical Narrative:    For dc today, wife at bedside and will transport home. Has no needs.  NCM contacted April with [facility] to give notification of admit.         Patient Goals and CMS Choice            Discharge Placement                       Discharge Plan and Services Additional resources added to the After Visit Summary for                                       Social Drivers of Health (SDOH) Interventions SDOH Screenings   Food Insecurity: No Food Insecurity (01/13/2024)  Housing: Low Risk  (01/13/2024)  Transportation Needs: No Transportation Needs (01/13/2024)  Utilities: Not At Risk (01/13/2024)  Social Connections: Socially Integrated (01/13/2024)  Tobacco Use: Low Risk  (01/13/2024)     Readmission Risk Interventions    01/14/2024    1:38 PM  Readmission Risk Prevention Plan  Post Dischage Appt Complete  Medication Screening Complete  Transportation Screening Complete

## 2024-01-14 NOTE — Discharge Summary (Signed)
 Physician Discharge Summary   Patient: Kevin Heath MRN: [id number] DOB: 03/21/1956  Admit date:     01/12/2024  Discharge date: 01/14/24  Discharge Physician: Justina Oman   PCP: [facility]   Recommendations at discharge:  Repeat basic metabolic panel to follow electrolytes and renal function Reassess blood pressure and adjust antihypertensive treatment as needed Make sure patient follow-up with cardiology service as instructed  Discharge Diagnoses: Principal Problem:   Acute on chronic heart failure [facility]) Active Problems:   Primary hypertension   Stage 3a chronic kidney disease [facility])  Brief Hospital admission narrative: As per H&P written by Dr. Sulema Endo on 01/12/2024 Kevin Heath is a 68 y.o. male with medical history significant of HTN, HLD, chronic combined CHF, COPD, and CKD p/w cp and BLE edema c/f acute exacerbation of CHF.   Pt states that he was in his USOH until Sunday evening around 9p when he became SOB while trying to lay flat in his bed. He was unable to ly flat, and instead went to the living room and slept in the chair, where he was up intermittently overnight with SOB. Pt states that he tried to ly down again overnight, but was again unsuccessful. If anything, he remembers being more short of breath the second time. He finally decided to present to the ED this morning around 0800 when his sx didn't improve. Pt states that he takes PO lasix  80mg  BID faithfully, and that he does not add salt to his food; additionally, he cooks most of his meals (w/ Ms[employer] to season), and weighs himself 3 days/week.   In the ED, the patient was noted to have elevated BNP 281, and mildly elevated troponins (from 49 down to 17). Pt admitted to medicine for IV diuresis iso ADHF.   Assessment and Plan: 1-acute on chronic combined heart failure -Recent echo demonstrating ejection fraction of 30-35% with a grade 2 diastolic dysfunction - Excellent response to IV diuresis -  Patient discharged home with direction to follow low-sodium diet, daily weights and adequate hydration. - Continue patient follow-up with cardiology service - Oral diuretics has been adjusted to Demadex 60 mg twice a day. - Patient will continue treatment with Coreg , Imdur , hydralazine  and Jardiance .  2-hypertension - Continue current antihypertensive agents - Follow vital sign - Heart healthy diet discussed with patient.  3-paroxysmal atrial fibrillation - Will continue carvedilol  and Eliquis  for secondary prevention.  4-GERD/GI prophylaxis - Continue PPI  5-hypokalemia/hypomagnesemia - Most likely associated with ongoing diuresis - Electrolytes-repleted, daily supplementation prescribed at discharge - Continue to follow electrolytes trend.  6-class I obesity -Body mass index is 32.35 kg/m. - Low-calorie diet, portion control and increase physical activity discussed with patient.  Consultants: None Procedures performed: See below for x-ray reports. Disposition: Home Diet recommendation: Heart healthy/low-sodium diet.  DISCHARGE MEDICATION: Allergies as of 01/14/2024       Reactions   Angiotensin Anaphylaxis   Ace Inhibitors Swelling   Lisinopril Swelling   Respiratory arrest   Sertraline Hcl    Lightheaded   Spironolactone    Engorgement of breasts        Medication List     STOP taking these medications    furosemide  40 MG tablet Commonly known as: LASIX    potassium chloride  10 MEQ tablet Commonly known as: KLOR-CON        TAKE these medications    albuterol  108 (90 Base) MCG/ACT inhaler Commonly known as: VENTOLIN  HFA Inhale 2 puffs into the lungs every 6 (  six) hours as needed for shortness of breath.   atorvastatin  80 MG tablet Commonly known as: LIPITOR  Take 1 tablet (80 mg total) by mouth daily.   carvedilol  3.125 MG tablet Commonly known as: COREG  Take 1 tablet (3.125 mg total) by mouth 2 (two) times daily.   Eliquis  5 MG Tabs  tablet Generic drug: apixaban  Take 1 tablet (5 mg total) by mouth 2 (two) times daily.   empagliflozin  10 MG Tabs tablet Commonly known as: Jardiance  Take 1 tablet (10 mg total) by mouth daily before breakfast. What changed: how much to take   hydrALAZINE  25 MG tablet Commonly known as: APRESOLINE  Take 1 tablet (25 mg total) by mouth 2 (two) times daily. What changed:  how much to take when to take this   isosorbide  mononitrate 30 MG 24 hr tablet Commonly known as: IMDUR  Take 1/2 tablet (15 mg total) by mouth daily.   magnesium  oxide 400 MG tablet Commonly known as: MAG-OX Take 400 mg by mouth daily.   pantoprazole 40 MG tablet Commonly known as: PROTONIX Take 1 tablet (40 mg total) by mouth daily. Start taking on: Jan 15, 2024   potassium chloride  SA 20 MEQ tablet Commonly known as: KLOR-CON  M Take 2 tablets (40 mEq total) by mouth 2 (two) times daily.   torsemide 20 MG tablet Commonly known as: DEMADEX Take 3 tablets (60 mg total) by mouth 2 (two) times daily.        Follow-up Information     [facility]inic[facility] Schedule an appointment as soon as possible for a visit in 10 day(s).   Contact information: [address] [facility] [place] [address] [phone]                Discharge Exam: Filed Weights   01/12/24 1055 01/13/24 1203 01/14/24 0433  Weight: 96.2 kg 90.8 kg 90.9 kg   General exam: Alert, awake, oriented x 3; no requiring oxygen supplementation, expressing no orthopnea, no chest pain, no fever, no nausea, no vomiting.  Ready to go home. Respiratory system: No wheezing, no crackles, no using accessory muscles.  Good saturation on room air. Cardiovascular system: Rate controlled, no rubs, no gallops, no JVD on exam. Gastrointestinal system: Abdomen is nondistended, soft and nontender. No organomegaly or masses felt. Normal bowel sounds heard. Central nervous system:  No focal neurological deficits. Extremities: No  cyanosis or clubbing. Skin: No petechiae. Psychiatry: Judgement and insight appear normal. Mood & affect appropriate.    Condition at discharge: Stable and improved.  The results of significant diagnostics from this hospitalization (including imaging, microbiology, ancillary and laboratory) are listed below for reference.   Imaging Studies: DG Chest 2 View Result Date: 01/12/2024 CLINICAL DATA:  Chest pain. EXAM: CHEST - 2 VIEW COMPARISON:  07/26/2023. FINDINGS: Low lung volume. Mild-to-moderate diffuse pulmonary vascular congestion with bilateral hilar and bibasilar predominance. Bilateral lung fields are otherwise clear. No acute consolidation or lung collapse. Bilateral costophrenic angles are clear. Stable cardio-mediastinal silhouette. No acute osseous abnormalities. The soft tissues are within normal limits. IMPRESSION: *Findings favor congestive heart failure/pulmonary edema. Electronically Signed   By: Beula Brunswick M.D.   On: 01/12/2024 11:21    Microbiology: Results for orders placed or performed during the hospital encounter of 07/26/23  Resp panel by RT-PCR (RSV, Flu A&B, Covid) Anterior Nasal Swab     Status: None   Collection Time: 07/26/23  6:19 PM   Specimen: Anterior Nasal Swab  Result Value Ref Range Status   SARS Coronavirus  2 by RT PCR NEGATIVE NEGATIVE Final   Influenza A by PCR NEGATIVE NEGATIVE Final   Influenza B by PCR NEGATIVE NEGATIVE Final    Comment: (NOTE) The Xpert Xpress SARS-CoV-2/FLU/RSV plus assay is intended as an aid in the diagnosis of influenza from Nasopharyngeal swab specimens and should not be used as a sole basis for treatment. Nasal washings and aspirates are unacceptable for Xpert Xpress SARS-CoV-2/FLU/RSV testing.  Fact Sheet for Patients: BloggerCourse.com  Fact Sheet for Healthcare Providers: SeriousBroker.it  This test is not yet approved or cleared by the [place]  FDA and has  been authorized for detection and/or diagnosis of SARS-CoV-2 by FDA under an Emergency Use Authorization (EUA). This EUA will remain in effect (meaning this test can be used) for the duration of the COVID-19 declaration under Section 564(b)(1) of the Act, 21 U.S.C. section 360bbb-3(b)(1), unless the authorization is terminated or revoked.     Resp Syncytial Virus by PCR NEGATIVE NEGATIVE Final    Comment: (NOTE) Fact Sheet for Patients: BloggerCourse.com  Fact Sheet for Healthcare Providers: SeriousBroker.it  This test is not yet approved or cleared by the [place]  FDA and has been authorized for detection and/or diagnosis of SARS-CoV-2 by FDA under an Emergency Use Authorization (EUA). This EUA will remain in effect (meaning this test can be used) for the duration of the COVID-19 declaration under Section 564(b)(1) of the Act, 21 U.S.C. section 360bbb-3(b)(1), unless the authorization is terminated or revoked.  Performed at [facility], [address] [place], [place] [address]     Labs: CBC: Recent Labs  Lab 01/12/24 1101  WBC 6.7  HGB 15.1  HCT 49.7  MCV 91.5  PLT 241   Basic Metabolic Panel: Recent Labs  Lab 01/12/24 1101 01/13/24 0715 01/14/24 0310  NA 140 142 140  K 3.9 3.9 3.4*  CL 106 107 103  CO2 25 23 27   GLUCOSE 150* 111* 125*  BUN 15 15 23   CREATININE 1.74* 1.63* 1.82*  CALCIUM  9.8 9.9 10.3   Liver Function Tests: Recent Labs  Lab 01/12/24 1430  AST 23  ALT 23  ALKPHOS 72  BILITOT 0.6  PROT 6.7  ALBUMIN 3.3*   CBG: No results for input(s): "GLUCAP" in the last 168 hours.  Discharge time spent: greater than 30 minutes.  Signed: Justina Oman, MD [facility] 01/14/2024

## 2024-01-14 NOTE — Progress Notes (Signed)
 Nutrition Education Note  RD team consulted for low sodium diet education. Pt set to discharge.  RD provided "Low Sodium Nutrition Therapy" from the Academy of Nutrition and Dietetics to discharge instructions. Per H&P, pt takes Lasix  regularly, does not add salt to food, cooks most of his meals, and weights himself regularly.   Pt currently on the 2 gm Sodium Diet, meal consumption 85-100%. Labs and medications reviewed.   No additional nutrition interventions warranted at this time. Please re-consult as needed.   Doneta Furbish RD, LDN Clinical Dietitian

## 2024-01-20 ENCOUNTER — Encounter (HOSPITAL_COMMUNITY): Payer: Self-pay

## 2024-01-23 ENCOUNTER — Ambulatory Visit (HOSPITAL_COMMUNITY)
Admission: RE | Admit: 2024-01-23 | Discharge: 2024-01-23 | Disposition: A | Discharge status: Home / Self Care | Source: Ambulatory Visit | Attending: Cardiology | Admitting: Cardiology

## 2024-01-23 ENCOUNTER — Ambulatory Visit (HOSPITAL_COMMUNITY): Payer: Self-pay | Admitting: Family Medicine

## 2024-01-23 DIAGNOSIS — I5022 Chronic systolic (congestive) heart failure: Secondary | ICD-10-CM | POA: Insufficient documentation

## 2024-01-23 LAB — BASIC METABOLIC PANEL WITH GFR
Anion gap: 9 (ref 5–15)
BUN: 25 mg/dL — ABNORMAL HIGH (ref 8–23)
CO2: 25 mmol/L (ref 22–32)
Calcium: 9.9 mg/dL (ref 8.9–10.3)
Chloride: 106 mmol/L (ref 98–111)
Creatinine, Ser: 2.12 mg/dL — ABNORMAL HIGH (ref 0.61–1.24)
GFR, Estimated: 34 mL/min — ABNORMAL LOW (ref >60)
Glucose, Bld: 178 mg/dL — ABNORMAL HIGH (ref 70–99)
Potassium: 3.6 mmol/L (ref 3.5–5.1)
Sodium: 140 mmol/L (ref 135–145)

## 2024-02-10 ENCOUNTER — Telehealth (HOSPITAL_COMMUNITY): Payer: Self-pay | Admitting: *Deleted

## 2024-02-10 NOTE — Telephone Encounter (Signed)
 Attempted to call patient regarding upcoming cardiac PET appointment. Left message on voicemail with name and callback number Johney Frame RN Navigator Cardiac Imaging [facility] Heart and Vascular Services [phone] Office

## 2024-02-12 ENCOUNTER — Encounter (HOSPITAL_COMMUNITY)
Admission: RE | Admit: 2024-02-12 | Discharge: 2024-02-12 | Disposition: A | Discharge status: Home / Self Care | Payer: Self-pay | Source: Ambulatory Visit | Attending: Family Medicine | Admitting: Family Medicine

## 2024-02-12 DIAGNOSIS — D869 Sarcoidosis, unspecified: Secondary | ICD-10-CM

## 2024-02-12 MED ORDER — FLUDEOXYGLUCOSE F - 18 (FDG) INJECTION
8.6400 | Freq: Once | INTRAVENOUS | Status: AC
  Administered 2024-02-12: 8.64 via INTRAVENOUS

## 2024-02-12 MED ORDER — RUBIDIUM RB82 GENERATOR (RUBYFILL)
23.4900 | PACK | Freq: Once | INTRAVENOUS | Status: AC
  Administered 2024-02-12: 23.49 via INTRAVENOUS

## 2024-02-15 LAB — NM PET CT MYOCARDIAL SARCOIDOSIS
Nuc Stress EF: 30 %
Rest Nuclear Isotope Dose: 23.5 mCi

## 2024-02-16 ENCOUNTER — Ambulatory Visit (HOSPITAL_COMMUNITY): Payer: Self-pay | Admitting: Cardiology

## 2024-02-16 NOTE — Progress Notes (Signed)
 Since this is a new diagnosis and new start, patient needs to be seen by MD or APP before discussing protocol in pharmacy clinic. Happy to see them for follow up.

## 2024-03-10 ENCOUNTER — Telehealth (HOSPITAL_COMMUNITY): Payer: Self-pay | Admitting: Cardiology

## 2024-03-10 NOTE — Telephone Encounter (Signed)
 Called to confirm/remind patient of their appointment at the [facility] on 03/10/2024.   Appointment:   [] Confirmed  [x] Left mess   [] No answer/No voice mail  [] VM Full/unable to leave message  [] Phone not in service  Patient reminded to bring all medications and/or complete list.  Confirmed patient has transportation. Gave directions, instructed to utilize valet parking.

## 2024-03-10 NOTE — Progress Notes (Signed)
 ADVANCED HEART FAILURE CLINIC NOTE  Referring Physician: Clinic, Kevin [facility]  Primary Care: Clinic, [facility] HF: Dr. Gardenia  CC: HFrEF  HPI: Kevin Heath is a 68 y.o. male hypertension, hyperlipidemia, chronic kidney disease, obstructive sleep apnea, COPD and heart failure with reduced ejection fraction presenting today to establish care  His cardiac history dates back to at least June 2024 when echocardiogram demonstrated EF of 40 to 45%.  He was admitted in August 2024 to the [facility] emergency department with chest pain and shortness of breath, received IV Lasix  and was discharged home.  He establish care with us  in November 2024 when he presented to [facility] with COPD and HFrEF exacerbation.  During that admission echocardiogram with EF of 30%.  Underwent right and left heart catheterization which demonstrated minimal nonobstructive CAD and a Fick cardiac index of 1.8 L/min/m.  He was diuresed, started on GDMT and discharged home.   Interval history Since discharge from the hospital Kevin Heath reports significant improvement in symptoms of dyspnea, PND and orthopnea.  He has been compliant with all medications.  Reports no lightheadedness.  He has had worsening lower extremity edema over the past 3 to 4 days.    Current Outpatient Medications  Medication Sig Dispense Refill   albuterol  (VENTOLIN  HFA) 108 (90 Base) MCG/ACT inhaler Inhale 2 puffs into the lungs every 6 (six) hours as needed for shortness of breath.     apixaban  (ELIQUIS ) 5 MG TABS tablet Take 1 tablet (5 mg total) by mouth 2 (two) times daily. 60 tablet 0   atorvastatin  (LIPITOR ) 80 MG tablet Take 1 tablet (80 mg total) by mouth daily. 30 tablet 3   carvedilol  (COREG ) 3.125 MG tablet Take 1 tablet (3.125 mg total) by mouth 2 (two) times daily. 60 tablet 3   empagliflozin  (JARDIANCE ) 10 MG TABS tablet Take 1 tablet (10 mg total) by mouth daily before breakfast. (Patient taking differently: Take 25 mg by  mouth daily before breakfast.) 30 tablet 5   hydrALAZINE  (APRESOLINE ) 25 MG tablet Take 1 tablet (25 mg total) by mouth 2 (two) times daily.     isosorbide  mononitrate (IMDUR ) 30 MG 24 hr tablet Take 1/2 tablet (15 mg total) by mouth daily. 15 tablet 1   magnesium  oxide (MAG-OX) 400 MG tablet Take 400 mg by mouth daily.     pantoprazole  (PROTONIX ) 40 MG tablet Take 1 tablet (40 mg total) by mouth daily. 30 tablet 1   potassium chloride  SA (KLOR-CON  M) 20 MEQ tablet Take 2 tablets (40 mEq total) by mouth 2 (two) times daily. 120 tablet 0   torsemide  (DEMADEX ) 20 MG tablet Take 3 tablets (60 mg total) by mouth 2 (two) times daily. 180 tablet 3   No current facility-administered medications for this visit.   PHYSICAL EXAM: There were no vitals filed for this visit. GENERAL: NAD Lungs- *** CARDIAC:  JVP: *** cm          Normal rate with regular rhythm. *** murmur.  Pulses ***. *** edema.  ABDOMEN: Soft, non-tender, non-distended.  EXTREMITIES: Warm and well perfused.  NEUROLOGIC: No obvious FND   DATA REVIEW  ECG: 07/27/23: sinus bradycardia   As per my personal interpretation  ECHO: 07/28/23: LVEF 30%, normal RV function as per my personal interpretation  CATH: 07/29/23: Angiographically minimal CAD in a Right dominant system Borderline Severe Pulmonary Hypertension-Pulmonary Venous Congestion from Combined Systolic and Diastolic Heart Failure Mean PAP 43 to 45 mmHg with PCWP of roughly 38  mmHg and LVEDP of 40 mmHg. Cardiac output-index by Fick 3.62-1.75 As per my personal interpretation  CMR: 08/07/23: 1. Normal LV size with diffuse hypokinesis worse in the septum, LV EF 32%.  2.  Normal RV size and systolic function, EF 60%.  3. Non-coronary LGE pattern. Extensive mid-wall LGE in the basal to mid septum and inferior wall. Consider prior myocarditis, infiltrative disease, or possible familial arrhythmic cardiomyopathy. No wall thickening, does not appear consistent  with hypertrophic cardiomyopathy. T2 is slightly elevated towards the apex but not in the basal to mid LV where there is LGE, probably not active myocarditis. 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 Would consider Cardiac PET evaluation.  ASSESSMENT & PLAN:  Herat failure with reduced EF Etiology of YQ:wnwpdryzfpr; LHC as above. 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 Will plan on obtaining cardiac PET. In addition, he had frequent PVCs during his admission.  NYHA class / AHA Stage:NYHA III Volume status & Diuretics: lasix  40mg  BID; hypervolemic on exam; increase lasix  to 60mg  BID for 4 days.  Vasodilators:hydralazine  25mg  TID, imdur  30mg  daily; will switch to BIDIL  1 tab TID Beta-Blocker:start coreg  3.125mg  BID in 4 days once euvolemic. MRA:eplerenone  50mg   Cardiometabolic:farxiga  10mg  Devices therapies & Valvulopathies:Escalating GDMT; plan on PET to evaluate for sarcoid prior to ICD placement.  Advanced therapies:Not currently indicated.   2. CKD IIIB - sCr ~1.6-1.7 at baseline w/ eGFR in the 40s.  - Repeat BMP/BNP  3. NSVT - significant improvement on telemetry at the time of discharge in 11/24 - Will plan on ziopatch at follow up.  - Cardiac PET to rule out sarcoid - continue amiodarone  200mg  daily. Will D/C after ziopatch.   4. Hypertension  - hydralazine  25mg  TID, imdur  30mg  - angioedema with ACEI; will hold off on addition of ARB/ARNI/ACE-I - repeat albs today - start coreg  3.125mg  BID  5. Nonobstructive CAD  - minimal CAD on LHC - continue statin   6. Hyperlipidemia  - LDL elevated at 125 - Lipitor  80mg  daily - repeat lipid panel in 3 months (3/25)  7. COPD  - stable on exam today - followed by pulmonology  - Will plan on PFTs at follow up.   I spent *** minutes caring for this patient today including face to face time, ordering and reviewing labs, reviewing  records from ***, seeing the patient, documenting in the record, and arranging follow ups.   Kevin Heath Advanced Heart Failure Mechanical Circulatory Support

## 2024-03-11 ENCOUNTER — Ambulatory Visit (HOSPITAL_COMMUNITY)
Admission: RE | Admit: 2024-03-11 | Discharge: 2024-03-11 | Disposition: A | Discharge status: Home / Self Care | Source: Ambulatory Visit | Attending: Cardiology | Admitting: Cardiology

## 2024-03-11 ENCOUNTER — Encounter (HOSPITAL_COMMUNITY): Payer: Self-pay | Admitting: Cardiology

## 2024-03-11 VITALS — BP 130/78 | HR 64 | Ht 66.0 in | Wt 209.4 lb

## 2024-03-11 DIAGNOSIS — Z79899 Other long term (current) drug therapy: Secondary | ICD-10-CM | POA: Insufficient documentation

## 2024-03-11 DIAGNOSIS — E785 Hyperlipidemia, unspecified: Secondary | ICD-10-CM | POA: Insufficient documentation

## 2024-03-11 DIAGNOSIS — I5022 Chronic systolic (congestive) heart failure: Secondary | ICD-10-CM | POA: Insufficient documentation

## 2024-03-11 DIAGNOSIS — I130 Hypertensive heart and chronic kidney disease with heart failure and stage 1 through stage 4 chronic kidney disease, or unspecified chronic kidney disease: Secondary | ICD-10-CM | POA: Insufficient documentation

## 2024-03-11 DIAGNOSIS — I2510 Atherosclerotic heart disease of native coronary artery without angina pectoris: Secondary | ICD-10-CM | POA: Insufficient documentation

## 2024-03-11 DIAGNOSIS — J449 Chronic obstructive pulmonary disease, unspecified: Secondary | ICD-10-CM | POA: Insufficient documentation

## 2024-03-11 DIAGNOSIS — I10 Essential (primary) hypertension: Secondary | ICD-10-CM

## 2024-03-11 DIAGNOSIS — G4733 Obstructive sleep apnea (adult) (pediatric): Secondary | ICD-10-CM | POA: Insufficient documentation

## 2024-03-11 DIAGNOSIS — I493 Ventricular premature depolarization: Secondary | ICD-10-CM | POA: Insufficient documentation

## 2024-03-11 DIAGNOSIS — N1832 Chronic kidney disease, stage 3b: Secondary | ICD-10-CM | POA: Insufficient documentation

## 2024-03-11 DIAGNOSIS — D8685 Sarcoid myocarditis: Secondary | ICD-10-CM | POA: Insufficient documentation

## 2024-03-11 DIAGNOSIS — D869 Sarcoidosis, unspecified: Secondary | ICD-10-CM

## 2024-03-11 NOTE — Patient Instructions (Signed)
 Medication Changes:  No Changes In Medications at this time.   Lab Work:  PLEASE GO TO [facility] [address] [facility] TO HAVE LABS DRAWN TODAY   Testing/Procedures:  ECHOCARDIOGRAM AS SCHEDULED   CHEST CT---SCHEDULING WILL REACH OUT TO GET THIS SCHEDULED ONCE APPROVED WITH INSURANCE   Follow-Up in: 1 MONTH WITH LAUREN AS SCHEDULED   THEN AGAIN WITH DR. SABHARWAL IN 2 MONTHS AS SCHEDULED   At the Advanced Heart Failure Clinic, you and your health needs are our priority. We have a designated team specialized in the treatment of Heart Failure. This Care Team includes your primary Heart Failure Specialized Cardiologist (physician), Advanced Practice Providers (APPs- Physician Assistants and Nurse Practitioners), and Pharmacist who all work together to provide you with the care you need, when you need it.   You may see any of the following providers on your designated Care Team at your next follow up:  Dr. Toribio Fuel Dr. Ezra Shuck Dr. Ria Commander Dr. Odis Brownie Greig Mosses, NP Caffie Shed, [place] [facility] [place], [place] Beckey Coe, NP [place] Lee, NP Tinnie Redman, PharmD   Please be sure to bring in all your medications bottles to every appointment.   Need to Contact Us :  If you have any questions or concerns before your next appointment please send us  a message through [place] or call our office at [phone].    TO LEAVE A MESSAGE FOR THE NURSE SELECT OPTION 2, PLEASE LEAVE A MESSAGE INCLUDING: YOUR NAME DATE OF BIRTH CALL BACK NUMBER REASON FOR CALL**this is important as we prioritize the call backs  YOU WILL RECEIVE A CALL BACK THE SAME DAY AS LONG AS YOU CALL BEFORE 4:00 PM

## 2024-03-15 LAB — CBC WITH DIFFERENTIAL/PLATELET
Basophils Absolute: 0 x10E3/uL (ref 0.0–0.2)
Basos: 1 %
EOS (ABSOLUTE): 0.3 x10E3/uL (ref 0.0–0.4)
Eos: 4 %
Hematocrit: 47.6 % (ref 37.5–51.0)
Hemoglobin: 15 g/dL (ref 13.0–17.7)
Immature Grans (Abs): 0 x10E3/uL (ref 0.0–0.1)
Immature Granulocytes: 0 %
Lymphocytes Absolute: 2.2 x10E3/uL (ref 0.7–3.1)
Lymphs: 34 %
MCH: 28.6 pg (ref 26.6–33.0)
MCHC: 31.5 g/dL (ref 31.5–35.7)
MCV: 91 fL (ref 79–97)
Monocytes Absolute: 0.8 x10E3/uL (ref 0.1–0.9)
Monocytes: 12 %
Neutrophils Absolute: 3.2 x10E3/uL (ref 1.4–7.0)
Neutrophils: 49 %
Platelets: 230 x10E3/uL (ref 150–450)
RBC: 5.24 x10E6/uL (ref 4.14–5.80)
RDW: 13.1 % (ref 11.6–15.4)
WBC: 6.5 x10E3/uL (ref 3.4–10.8)

## 2024-03-15 LAB — QUANTIFERON-TB GOLD PLUS
QuantiFERON Mitogen Value: >10 [IU]/mL
QuantiFERON Nil Value: 0.12 [IU]/mL
QuantiFERON TB1 Ag Value: 0.17 [IU]/mL
QuantiFERON TB2 Ag Value: 0.17 [IU]/mL

## 2024-03-15 LAB — BASIC METABOLIC PANEL WITH GFR
BUN/Creatinine Ratio: 12 (ref 10–24)
BUN: 21 mg/dL (ref 8–27)
CO2: 24 mmol/L (ref 20–29)
Calcium: 10.1 mg/dL (ref 8.6–10.2)
Chloride: 98 mmol/L (ref 96–106)
Creatinine, Ser: 1.75 mg/dL — ABNORMAL HIGH (ref 0.76–1.27)
Glucose: 118 mg/dL — ABNORMAL HIGH (ref 70–99)
Potassium: 3.3 mmol/L — ABNORMAL LOW (ref 3.5–5.2)
Sodium: 140 mmol/L (ref 134–144)
eGFR: 42 mL/min/1.73 — ABNORMAL LOW (ref >59)

## 2024-03-15 LAB — BRAIN NATRIURETIC PEPTIDE: BNP: 130.7 pg/mL — AB (ref 0.0–100.0)

## 2024-03-16 ENCOUNTER — Telehealth (HOSPITAL_COMMUNITY): Payer: Self-pay

## 2024-03-16 NOTE — Telephone Encounter (Signed)
  ADVANCED HEART FAILURE CLINIC   Pre-operative Risk Assessment    Request for Surgical Clearance{  Procedure:  Multiple dental maxillary Implants   Date of Surgery:  Clearance TBD                                Surgeon:  Recardo Gartner, DDS, MAJ  Surgeon's Group or Practice Name[facility]  [facility]   Phone number:  [phone] Fax number:  [phone] {  Type of Clearance Requested:   - Medical  - Pharmacy:  Hold Apixaban  (Eliquis )   { Type of Anesthesia:  General  also with local anesthesia with 2% lidocaine  with 1:100,000 epinephrine   Additional requests/questions:  Please fax a copy of clearance to the surgeon's office.  Signed, Jasilyn Holderman B Fauna Neuner   03/16/2024, 9:16 AM

## 2024-03-19 NOTE — Telephone Encounter (Signed)
 [place], Harlene HERO, FNP to Me (Selected Message)     03/16/24 12:41 PM OK for dental implants. Needs to complete soon, before we start immunosuppression for his sarcoid. If unable to complete before this, then will need to be delayed x months afterwards  Copy of clearance with provider recommendations faxed to requesting office via Epic fax function.

## 2024-03-19 NOTE — Telephone Encounter (Signed)
Attempted to call patient, left message for patient to call back to office.   

## 2024-03-22 NOTE — Telephone Encounter (Signed)
 Pt is aware info has been faxed, however he is not sch to have it done until 04/26/24, which was the first available date. Pt is sch to see pharm clinic on 8/13, should this be pushed out until after dental implant?

## 2024-03-30 NOTE — Telephone Encounter (Signed)
 [place], Harlene HERO, FNP to [place] HERO, RN    03/26/24 12:48 PM Yes, if it's just going to be 1 appt for dental implant, would push pharm appt back so he can get implant then we can start sarcoid protocol   Attempted to call patient, left message for patient to call back to office.

## 2024-04-02 ENCOUNTER — Ambulatory Visit (HOSPITAL_COMMUNITY)
Admission: RE | Admit: 2024-04-02 | Discharge: 2024-04-02 | Disposition: A | Discharge status: Home / Self Care | Payer: Self-pay | Source: Ambulatory Visit | Attending: Cardiology | Admitting: Cardiology

## 2024-04-02 ENCOUNTER — Encounter (HOSPITAL_COMMUNITY): Payer: Self-pay | Admitting: Family Medicine

## 2024-04-02 DIAGNOSIS — I110 Hypertensive heart disease with heart failure: Secondary | ICD-10-CM | POA: Insufficient documentation

## 2024-04-02 DIAGNOSIS — D869 Sarcoidosis, unspecified: Secondary | ICD-10-CM | POA: Insufficient documentation

## 2024-04-02 DIAGNOSIS — I509 Heart failure, unspecified: Secondary | ICD-10-CM | POA: Insufficient documentation

## 2024-04-02 DIAGNOSIS — I77810 Thoracic aortic ectasia: Secondary | ICD-10-CM | POA: Insufficient documentation

## 2024-04-02 LAB — ECHOCARDIOGRAM COMPLETE
AR max vel: 2.94 cm2
AV Area VTI: 2.74 cm2
AV Area mean vel: 2.8 cm2
AV Mean grad: 2 mmHg
AV Peak grad: 4.8 mmHg
Ao pk vel: 1.09 m/s
Area-P 1/2: 3.36 cm2
S' Lateral: 4.7 cm

## 2024-04-02 NOTE — Telephone Encounter (Signed)
 Please see copy of clearance letter under letters tab that has been printed and faxed to Fonda Silvan, DMD oral surgery at [facility] [place].

## 2024-04-02 NOTE — Progress Notes (Incomplete)
***  In Progress*** ? ?  ?Advanced Heart Failure Clinic Note  ? ?HPI:  ? ?Today he returns to HF clinic for pharmacist medication titration. At last visit with MD ***.  ? ?Shortness of breath/dyspnea on exertion? {YES NO:22349}  ?Orthopnea/PND? {YES NO:22349} ?Edema? {YES NO:22349} ?Lightheadedness/dizziness? {YES NO:22349} ?Daily weights at home? {YES NO:22349} ?Blood pressure/heart rate monitoring at home? {YES NO:22349} ?Following low-sodium/fluid-restricted diet? {YES NO:22349} ? ?HF Medications: ? ? ?Has the patient been experiencing any side effects to the medications prescribed?  {YES NO:22349} ? ?Does the patient have any problems obtaining medications due to transportation or finances?   {YES NO:22349} ? ?Understanding of regimen: {excellent/good/fair/poor:19665} ?Understanding of indications: {excellent/good/fair/poor:19665} ?Potential of compliance: {excellent/good/fair/poor:19665} ?Patient understands to avoid NSAIDs. ?Patient understands to avoid decongestants. ?  ? ?Pertinent Lab Values: ?Serum creatinine ***, BUN ***, Potassium ***, Sodium ***, BNP ***, Magnesium ***, Digoxin ***  ? ?Vital Signs: ?Weight: *** (last clinic weight: ***) ?Blood pressure: ***  ?Heart rate: ***  ? ?Assessment/Plan: ?1. Chronic systolic CHF (EF ***), due to ***. NYHA class *** symptoms. ? ? - Basic disease state pathophysiology, medication indication, mechanism and side effects reviewed at length with patient and he verbalized understanding ? ?Follow up *** ? ? ?Audry Riles, PharmD, BCPS, BCCP, CPP ?Heart Failure Clinic Pharmacist ?[phone] ?  ?

## 2024-04-11 ENCOUNTER — Ambulatory Visit (HOSPITAL_COMMUNITY): Payer: Self-pay | Admitting: Cardiology

## 2024-04-11 DIAGNOSIS — I5022 Chronic systolic (congestive) heart failure: Secondary | ICD-10-CM

## 2024-04-14 ENCOUNTER — Other Ambulatory Visit (HOSPITAL_COMMUNITY)

## 2024-04-23 ENCOUNTER — Encounter: Payer: Self-pay | Admitting: Cardiology

## 2024-04-23 ENCOUNTER — Ambulatory Visit: Attending: Cardiology | Admitting: Cardiology

## 2024-04-23 VITALS — BP 106/64 | HR 66 | Ht 66.0 in | Wt 218.0 lb

## 2024-04-23 DIAGNOSIS — I5022 Chronic systolic (congestive) heart failure: Secondary | ICD-10-CM

## 2024-04-23 DIAGNOSIS — I480 Paroxysmal atrial fibrillation: Secondary | ICD-10-CM

## 2024-04-23 DIAGNOSIS — D8685 Sarcoid myocarditis: Secondary | ICD-10-CM

## 2024-04-23 DIAGNOSIS — I493 Ventricular premature depolarization: Secondary | ICD-10-CM

## 2024-04-23 NOTE — Patient Instructions (Signed)
 Medication Instructions:  Your physician recommends that you continue on your current medications as directed. Please refer to the Current Medication list given to you today.  *If you need a refill on your cardiac medications before your next appointment, please call your pharmacy*  Lab Work: None ordered.  If you have labs (blood work) drawn today and your tests are completely normal, you will receive your results only by: MyChart Message (if you have MyChart) OR A paper copy in the mail If you have any lab test that is abnormal or we need to change your treatment, we will call you to review the results.  Testing/Procedures: None ordered.   Follow-Up: At [facility], you and your health needs are our priority.  As part of our continuing mission to provide you with exceptional heart care, our providers are all part of one team.  This team includes your primary Cardiologist (physician) and Advanced Practice Providers or APPs (Physician Assistants and Nurse Practitioners) who all work together to provide you with the care you need, when you need it.  Your next appointment:   Follow up as needed with Dr Cindie - let us  know if you decide to move forward with ICD implant.

## 2024-04-23 NOTE — Progress Notes (Signed)
 Electrophysiology Office Note:    Date:  04/23/2024   ID:  Kevin Heath, DOB 07-08-1956, MRN [id number]  [facility] Cardiologist:  None  [facility] Electrophysiologist:  None   Referring MD: Kevin Led, DO   Chief Complaint: Chronic systolic heart failure  History of Present Illness:   Kevin Heath is a 68 year old man who I am seeing today for evaluation of chronic systolic heart failure at the request of Dr. Gardenia.  chronic systolic heart failure. The patient has a history of hypertension, hyperlipidemia, chronic kidney disease, obstructive sleep apnea, COPD. The patient last saw Dr. Gardenia March 11, 2024.  He has had a left heart catheterization in the past which showed no significant coronary artery disease.  He had a hospitalization earlier this year with acute systolic heart failure.  Cardiac MRI was suggestive of prior myocarditis.  PET was planned.  His aunt died of congestive heart failureIn her 73s.  She had ICD implanted.  His mom passed away from heart failure as well.  She had a pacemaker.     Their past medical, social and family history was reviewed.   ROS:   Please see the history of present illness.    All other systems reviewed and are negative.  EKGs/Labs/Other Studies Reviewed:    The following studies were reviewed today:  April 02, 2024 echo EF 30 to 35% RV normal Trivial MR  September 12, 2023 ZIO monitor Heart rate 46-2 22, average 64 23 nonsustained VT episodes, longest 12.1 seconds with an average rate of 150 bpm 48 nonsustained SVT episodes, longest 23 seconds with an average rate of 105 bpm 2% burden of atrial fibrillation   August 03, 2023 cardiac MRI Extensive mid wall late gadolinium enhancement involving the basal to mid anteroseptum, inferoseptum, inferior walls LVEF 32 Normal RV  February 15, 2024 PET sarcoid Findings consistent with active myocardial inflammation/sarcoidosis  EKG  Interpretation Date/Time:  Friday April 23 2024 14:43:51 EDT Ventricular Rate:  66 PR Interval:  198 QRS Duration:  90 QT Interval:  418 QTC Calculation: 438 R Axis:   -31  Text Interpretation: Normal sinus rhythm Left axis deviation Confirmed by Cindie Smalls [phone]) on 04/23/2024 2:45:56 PM    Physical Exam:    VS:  BP 106/64   Pulse 66   Ht 5' 6 (1.676 m)   Wt 218 lb (98.9 kg)   SpO2 94%   BMI 35.19 kg/m     Wt Readings from Last 3 Encounters:  04/23/24 218 lb (98.9 kg)  03/11/24 209 lb 6.4 oz (95 kg)  01/14/24 200 lb 6.4 oz (90.9 kg)     GEN: no distress.  Overweight CARD: RRR, No MRG RESP: No IWOB. CTAB.        ASSESSMENT AND PLAN:    1. PVC's (premature ventricular contractions)   2. Chronic systolic heart failure (HCC)   3. Cardiac sarcoidosis   4. Paroxysmal atrial fibrillation (HCC)    #Chronic systolic heart failure #Cardiac sarcoidosis #Nonischemic cardiomyopathy The patient has a recent diagnosis of cardiac sarcoidosis.  PET scan showed active inflammation.  Heart monitor showed nonsustained ventricular tachycardia's.  I do think the patient has a strong indication for ICD implant.  He will also need to be immunosuppressed given the diagnosis of cardiac sarcoidosis.  I discussed the role of defibrillator therapy in reducing his risk of sudden cardiac death.  I think the defibrillator should be implanted and allowed to heal for several weeks before starting systemic immunosuppression.  This will reduce the risk of infection and perforation with steroids.  Plan would be for dual-chamber defibrillator given his atrial arrhythmia history.  The patient has an nonischemic CM (EF 30%), NYHA Class III CHF, and CAD.  He is referred by Dr Kevin for risk stratification of sudden death and consideration of ICD implantation.  At this time, he meets criteria for ICD implantation for primary prevention of sudden death.  I have had a thorough discussion with  the patient reviewing options.  The patient and their family (if available) have had opportunities to ask questions and have them answered.   The patient is unsure whether or not he wants to proceed with defibrillator therapy.  Risks, benefits, alternatives to ICD implantation were discussed in detail with the patient today. The patient understands that the risks include but are not limited to bleeding, infection, pneumothorax, perforation, tamponade, vascular damage, renal failure, MI, stroke, death, inappropriate shocks, and lead dislodgement.the patient is unsure whether or not he wants to proceed.  If he elects to proceed, plan for dual-chamber [employer] ICD.  He will need to hold his Eliquis  for 3 days prior to the procedure.  Case discussed with Dr. Gardenia from the heart failure team.  #Paroxysmal atrial fibrillation On Eliquis  for stroke prophylaxis  He will reach out to us  should he decide to proceed with defibrillator implant.  He understands that he is at increased risk of sudden cardiac death in the absence of defibrillator.  He understands that my recommendation is to proceed with defibrillator therapy.1   Signed, Ole T. Cindie, MD, [facility], [facility] 04/23/2024 3:08 PM    Electrophysiology [facility]

## 2024-05-05 NOTE — Progress Notes (Signed)
 ADVANCED HEART FAILURE CLINIC NOTE  Referring Physician: Clinic, [facility]  Primary Care: [facility], [facility] HF: Dr. Gardenia  CC: HFrEF, cardiac sarcoid  HPI: Kevin Heath is a 68 y.o. male hypertension, hyperlipidemia, chronic kidney disease, obstructive sleep apnea, COPD, HFrEF & cardiac sarcoid presenting today for follow up.   His cardiac history dates back to at least June 2024 when echocardiogram demonstrated EF of 40 to 45%.  He was admitted in August 2024 to the [facility] emergency department with chest pain and shortness of breath, received IV Lasix  and was discharged home.  He establish care with us  in November 2024 when he presented to [facility] with COPD and HFrEF exacerbation.  During that admission echocardiogram with EF of 30%.  Underwent right and left heart catheterization which demonstrated minimal nonobstructive CAD and a Fick cardiac index of 1.8 L/min/m.  He was diuresed, started on GDMT and discharged home. CMR with LGE distribution concerning for sarcoid. CXR with hilar adenopathy.   Interval history - He had repeat TTE in 8/25 with EF of 30 to 35%.  Patient saw Dr. Cindie however wished to forego ICD placement. - From a functional standpoint he is doing fairly well.  He is NYHA IIb-III symptoms.  Reports that overall his dyspnea and fatigue have improved significantly since starting GDMT.  He currently wishes to start treatment for sarcoid without ICD implant.    Current Outpatient Medications  Medication Sig Dispense Refill   albuterol  (VENTOLIN  HFA) 108 (90 Base) MCG/ACT inhaler Inhale 2 puffs into the lungs every 6 (six) hours as needed for shortness of breath.     atorvastatin  (LIPITOR ) 80 MG tablet Take 1 tablet (80 mg total) by mouth daily. 30 tablet 3   carvedilol  (COREG ) 3.125 MG tablet Take 1 tablet (3.125 mg total) by mouth 2 (two) times daily. 60 tablet 3   empagliflozin  (JARDIANCE ) 10 MG TABS tablet Take 1 tablet (10 mg total) by mouth  daily before breakfast. 30 tablet 5   hydrALAZINE  (APRESOLINE ) 25 MG tablet Take 1 tablet (25 mg total) by mouth 2 (two) times daily.     magnesium  oxide (MAG-OX) 400 MG tablet Take 400 mg by mouth daily.     pantoprazole  (PROTONIX ) 40 MG tablet Take 1 tablet (40 mg total) by mouth daily. 30 tablet 1   potassium chloride  SA (KLOR-CON  M) 20 MEQ tablet Take 2 tablets (40 mEq total) by mouth 2 (two) times daily. 120 tablet 0   torsemide  (DEMADEX ) 20 MG tablet Take 3 tablets (60 mg total) by mouth 2 (two) times daily. 180 tablet 3   apixaban  (ELIQUIS ) 5 MG TABS tablet Take 1 tablet (5 mg total) by mouth 2 (two) times daily. (Patient not taking: Reported on 05/06/2024) 60 tablet 0   isosorbide  mononitrate (IMDUR ) 30 MG 24 hr tablet Take 1 tablet (30 mg total) by mouth daily. 30 tablet 5   No current facility-administered medications for this encounter.   PHYSICAL EXAM: Vitals:   05/06/24 0848  BP: 128/82  Pulse: 60  SpO2: 95%   GENERAL: NAD Lungs- CTA CARDIAC:  JVP: 7 cm          Normal rate with regular rhythm. no murmur.  Pulses 2+. no edema.  ABDOMEN: Soft, non-tender, non-distended.  EXTREMITIES: Warm and well perfused.  NEUROLOGIC: No obvious FND   DATA REVIEW  ECG: 07/27/23: sinus bradycardia   As per my personal interpretation  ECHO: 07/28/23: LVEF 30%, normal RV function as per my personal  interpretation 04/02/24: LVEF 30-35%  CATH: 07/29/23: Angiographically minimal CAD in a Right dominant system Borderline Severe Pulmonary Hypertension-Pulmonary Venous Congestion from Combined Systolic and Diastolic Heart Failure Mean PAP 43 to 45 mmHg with PCWP of roughly 38 mmHg and LVEDP of 40 mmHg. Cardiac output-index by Fick 3.62-1.75 As per my personal interpretation  CMR: 08/07/23: 1. Normal LV size with diffuse hypokinesis worse in the septum, LV EF 32%.  2.  Normal RV size and systolic function, EF 60%.  3. Non-coronary LGE pattern. Extensive mid-wall LGE in the basal to mid  septum and inferior wall. Consider prior myocarditis, infiltrative disease, or possible familial arrhythmic cardiomyopathy. No wall thickening, does not appear consistent with hypertrophic cardiomyopathy. T2 is slightly elevated towards the apex but not in the basal to mid LV where there is LGE, probably not active myocarditis. 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 Would consider Cardiac PET evaluation.  ASSESSMENT & PLAN:  Herat failure with reduced EF Etiology of YQ:wnwpdryzfpr; LHC as above. 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  Cardiac PET consistent with active/early sarcoid. NYHA class / AHA Stage:NYHA IIB Volume status & Diuretics: torsemide  60mg  daily.  Vasodilators: hydralazine  25mg  TID, increase imdur  to 30mg  daily.  He previously had an anaphylactic reaction to ACE inhibitors.  I discussed cross-reactivity of ARB's.  At this time he would like to hold off on starting. Beta-Blocker: coreg  3.125mg  BID; HR 60 today. MRA: Start at follow-up Cardiometabolic:farxiga  10mg  Devices therapies & Valvulopathies: repeat TTE w/ LVEF of 30-35%. He met with Dr. Cindie but did not wish to proceed with ICD. We discussed risks/benefits. Will proceed with treatment of sarcoid.  Advanced therapies:Not currently indicated.   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  - Cardiac MRI with non-coronary LGE  - Cardiac PET 02/12/24: FDG uptake consistent with sarcoid.  - CXR from 5/25 with hilar fullness; will obtain CT chest for possible EBUS. Asymptomatic currently. Will try to obtain tissue sample prior to starting treatment.  - CBC, BNP, BMP and ACE levels today. -He unfortunately did not have a CT chest scheduled.  We reached out to radiology today.  Will plan to obtain tomorrow.  I suspect he  has hilar adenopathy that we can biopsy for conclusive diagnosis.  Will plan to start treatment within the next 2 to 3 weeks. - Discussed updating vaccinations with him.  - Patient does not wish to pursue ICD implant at this time.  We discussed risk benefits extensively.  He understands.  He also discussed this with Dr. Cindie.  2. CKD IIIB - sCr ~1.6-1.7 at baseline w/ eGFR in the 40s.  - sCr up to 2.12 44m ago.  - Repeat BMP/BNP today  3. NSVT - Currently off amiodarone .   4. Hypertension  - hydralazine  25mg  TID, imdur  30mg  - angioedema with ACEI; will hold off on addition of ARB/ARNI/ACE-I - continue coreg  3.125mg  BID  5. Nonobstructive CAD  - minimal CAD on LHC - continue statin   6. Hyperlipidemia  - LDL elevated at 125 - Lipitor  80mg  daily - repeat lipid panel in 3 months (3/25)  7. COPD  - stable on exam today - followed by pulmonology  - stable  I spent 40 minutes caring for this patient today including face to face time, ordering and reviewing labs, reviewing  echocardiogram from 8/25 EP note from 04/23/2024, discussing immunosuppression for cardiac sarcoid and potential side effects, discussing risks of foregoing ICD implant in the setting of cardiac sarcoid and reduced ejection fraction,,, seeing the patient, documenting in the record, and arranging follow ups.    Jacub Waiters Advanced Heart Failure Mechanical Circulatory Support

## 2024-05-06 ENCOUNTER — Encounter (HOSPITAL_COMMUNITY): Payer: Self-pay | Admitting: Cardiology

## 2024-05-06 ENCOUNTER — Ambulatory Visit (HOSPITAL_COMMUNITY)
Admission: RE | Admit: 2024-05-06 | Discharge: 2024-05-06 | Disposition: A | Discharge status: Home / Self Care | Payer: Self-pay | Source: Ambulatory Visit | Attending: Cardiology | Admitting: Cardiology

## 2024-05-06 VITALS — BP 128/82 | HR 60 | Wt 208.4 lb

## 2024-05-06 DIAGNOSIS — I480 Paroxysmal atrial fibrillation: Secondary | ICD-10-CM

## 2024-05-06 DIAGNOSIS — G4733 Obstructive sleep apnea (adult) (pediatric): Secondary | ICD-10-CM | POA: Insufficient documentation

## 2024-05-06 DIAGNOSIS — I5022 Chronic systolic (congestive) heart failure: Secondary | ICD-10-CM | POA: Insufficient documentation

## 2024-05-06 DIAGNOSIS — N1832 Chronic kidney disease, stage 3b: Secondary | ICD-10-CM | POA: Insufficient documentation

## 2024-05-06 DIAGNOSIS — Z79899 Other long term (current) drug therapy: Secondary | ICD-10-CM | POA: Insufficient documentation

## 2024-05-06 DIAGNOSIS — I2510 Atherosclerotic heart disease of native coronary artery without angina pectoris: Secondary | ICD-10-CM | POA: Insufficient documentation

## 2024-05-06 DIAGNOSIS — J449 Chronic obstructive pulmonary disease, unspecified: Secondary | ICD-10-CM | POA: Insufficient documentation

## 2024-05-06 DIAGNOSIS — D869 Sarcoidosis, unspecified: Secondary | ICD-10-CM | POA: Insufficient documentation

## 2024-05-06 DIAGNOSIS — D8685 Sarcoid myocarditis: Secondary | ICD-10-CM

## 2024-05-06 DIAGNOSIS — I493 Ventricular premature depolarization: Secondary | ICD-10-CM

## 2024-05-06 DIAGNOSIS — I130 Hypertensive heart and chronic kidney disease with heart failure and stage 1 through stage 4 chronic kidney disease, or unspecified chronic kidney disease: Secondary | ICD-10-CM | POA: Insufficient documentation

## 2024-05-06 DIAGNOSIS — E785 Hyperlipidemia, unspecified: Secondary | ICD-10-CM | POA: Insufficient documentation

## 2024-05-06 DIAGNOSIS — I472 Ventricular tachycardia, unspecified: Secondary | ICD-10-CM | POA: Insufficient documentation

## 2024-05-06 LAB — COMPREHENSIVE METABOLIC PANEL WITH GFR
ALT: 16 U/L (ref 0–44)
AST: 19 U/L (ref 15–41)
Albumin: 3.4 g/dL — ABNORMAL LOW (ref 3.5–5.0)
Alkaline Phosphatase: 77 U/L (ref 38–126)
Anion gap: 9 (ref 5–15)
BUN: 17 mg/dL (ref 8–23)
CO2: 28 mmol/L (ref 22–32)
Calcium: 10.1 mg/dL (ref 8.9–10.3)
Chloride: 104 mmol/L (ref 98–111)
Creatinine, Ser: 1.88 mg/dL — ABNORMAL HIGH (ref 0.61–1.24)
GFR, Estimated: 39 mL/min — ABNORMAL LOW (ref >60)
Glucose, Bld: 116 mg/dL — ABNORMAL HIGH (ref 70–99)
Potassium: 3.5 mmol/L (ref 3.5–5.1)
Sodium: 141 mmol/L (ref 135–145)
Total Bilirubin: 0.8 mg/dL (ref 0.0–1.2)
Total Protein: 7.1 g/dL (ref 6.5–8.1)

## 2024-05-06 LAB — CBC WITH DIFFERENTIAL/PLATELET
Abs Immature Granulocytes: 0.02 K/uL (ref 0.00–0.07)
Basophils Absolute: 0 K/uL (ref 0.0–0.1)
Basophils Relative: 0 %
Eosinophils Absolute: 0.2 K/uL (ref 0.0–0.5)
Eosinophils Relative: 3 %
HCT: 45.8 % (ref 39.0–52.0)
Hemoglobin: 14.6 g/dL (ref 13.0–17.0)
Immature Granulocytes: 0 %
Lymphocytes Relative: 25 %
Lymphs Abs: 1.7 K/uL (ref 0.7–4.0)
MCH: 29 pg (ref 26.0–34.0)
MCHC: 31.9 g/dL (ref 30.0–36.0)
MCV: 91.1 fL (ref 80.0–100.0)
Monocytes Absolute: 0.6 K/uL (ref 0.1–1.0)
Monocytes Relative: 9 %
Neutro Abs: 4.3 K/uL (ref 1.7–7.7)
Neutrophils Relative %: 63 %
Platelets: 269 K/uL (ref 150–400)
RBC: 5.03 MIL/uL (ref 4.22–5.81)
RDW: 13.3 % (ref 11.5–15.5)
WBC: 6.9 K/uL (ref 4.0–10.5)
nRBC: 0 % (ref 0.0–0.2)

## 2024-05-06 LAB — BRAIN NATRIURETIC PEPTIDE: B Natriuretic Peptide: 144.8 pg/mL — ABNORMAL HIGH (ref 0.0–100.0)

## 2024-05-06 MED ORDER — ISOSORBIDE MONONITRATE ER 30 MG PO TB24: 30.0000 mg | ORAL_TABLET | Freq: Every day | ORAL | 5 refills | Status: DC

## 2024-05-06 NOTE — Patient Instructions (Signed)
 Medication Changes:  INCREASE ISOSORBIDE  TO 30MG  DAILY   Lab Work:  Labs done today, your results will be available in MyChart, we will contact you for abnormal readings.  Testing/Procedures:  CT OF THE CHEST WO CONTRAST---scheduling will reach out to arrange this   Follow-Up in: 1 month as scheduled   At the Advanced [facility] Failure Clinic, you and your health needs are our priority. We have a designated team specialized in the treatment of Heart Failure. This Care Team includes your primary Heart Failure Specialized Cardiologist (physician), Advanced Practice Providers (APPs- Physician Assistants and Nurse Practitioners), and Pharmacist who all work together to provide you with the care you need, when you need it.   You may see any of the following providers on your designated Care Team at your next follow up:  Dr. Toribio Fuel Dr. Ezra Shuck Dr. Ria Commander Dr. Odis Brownie Greig Mosses, NP Caffie Shed, [place] [facility] [place], [place] Beckey Coe, NP [place] Lee, NP Tinnie Redman, PharmD   Please be sure to bring in all your medications bottles to every appointment.   Need to Contact Us :  If you have any questions or concerns before your next appointment please send us  a message through [place] or call our office at [phone].    TO LEAVE A MESSAGE FOR THE NURSE SELECT OPTION 2, PLEASE LEAVE A MESSAGE INCLUDING: YOUR NAME DATE OF BIRTH CALL BACK NUMBER REASON FOR CALL**this is important as we prioritize the call backs  YOU WILL RECEIVE A CALL BACK THE SAME DAY AS LONG AS YOU CALL BEFORE 4:00 PM

## 2024-05-07 LAB — ANGIOTENSIN CONVERTING ENZYME: Angiotensin-Converting Enzyme: 24 U/L (ref 14–82)

## 2024-05-10 ENCOUNTER — Telehealth (HOSPITAL_COMMUNITY): Payer: Self-pay

## 2024-05-10 NOTE — Telephone Encounter (Signed)
 Attempted to call patient regarding his insurance, and CT tomorrow.   Patient does not have approval from [place] for community care, therefore care here is not covered.   Wanted to ensure patient was aware of this prior to testing.   Left message for patient to call back.

## 2024-05-11 ENCOUNTER — Ambulatory Visit (HOSPITAL_COMMUNITY)
Admission: RE | Admit: 2024-05-11 | Discharge: 2024-05-11 | Disposition: A | Discharge status: Home / Self Care | Payer: Self-pay | Source: Ambulatory Visit | Attending: Cardiology | Admitting: Cardiology

## 2024-05-11 DIAGNOSIS — D869 Sarcoidosis, unspecified: Secondary | ICD-10-CM | POA: Insufficient documentation

## 2024-06-03 ENCOUNTER — Telehealth (HOSPITAL_COMMUNITY): Payer: Self-pay | Admitting: Cardiology

## 2024-06-03 NOTE — Telephone Encounter (Signed)
 Called to confirm/remind patient of their appointment at the [facility] Clinic on 06/03/2024.   Appointment:   [] Confirmed  [] Left mess   [] No answer/No voice mail  [x] VM Full/unable to leave message  [] Phone not in service  Patient reminded to bring all medications and/or complete list.  Confirmed patient has transportation. Gave directions, instructed to utilize valet parking.

## 2024-06-04 ENCOUNTER — Inpatient Hospital Stay (HOSPITAL_COMMUNITY)
Admission: RE | Admit: 2024-06-04 | Discharge: 2024-06-04 | Disposition: A | Discharge status: Home / Self Care | Source: Ambulatory Visit | Attending: Cardiology | Admitting: Cardiology

## 2024-06-04 ENCOUNTER — Encounter (HOSPITAL_COMMUNITY): Payer: Self-pay | Admitting: Cardiology

## 2024-06-04 ENCOUNTER — Ambulatory Visit (HOSPITAL_COMMUNITY)
Admission: RE | Admit: 2024-06-04 | Discharge: 2024-06-04 | Disposition: A | Discharge status: Home / Self Care | Payer: Self-pay | Source: Ambulatory Visit | Attending: Adult Health | Admitting: Adult Health

## 2024-06-04 ENCOUNTER — Other Ambulatory Visit (HOSPITAL_COMMUNITY): Payer: Self-pay | Admitting: Cardiology

## 2024-06-04 DIAGNOSIS — R002 Palpitations: Secondary | ICD-10-CM

## 2024-06-04 NOTE — Progress Notes (Incomplete)
 ADVANCED HEART FAILURE CLINIC NOTE  Referring Physician: Clinic[facility]  [facility] HF: Dr. Gardenia  CC: HFrEF, cardiac sarcoid  HPI: Kevin Heath is a 68 y.o. male hypertension, hyperlipidemia, chronic kidney disease, obstructive sleep apnea, COPD, HFrEF & cardiac sarcoid presenting today for follow up.   His cardiac history dates back to at least June 2024 when echocardiogram demonstrated EF of 40 to 45%.  He was admitted in August 2024 to the [facility] emergency department with chest pain and shortness of breath, received IV Lasix  and was discharged home.  He establish care with us  in November 2024 when he presented to [facility] with COPD and HFrEF exacerbation.  During that admission echocardiogram with EF of 30%.  Underwent right and left heart catheterization which demonstrated minimal nonobstructive CAD and a Fick cardiac index of 1.8 L/min/m.  He was diuresed, started on GDMT and discharged home. CMR with LGE distribution concerning for sarcoid. CXR with hilar adenopathy.   - He had repeat TTE in 8/25 with EF of 30 to 35%.  Patient saw Dr. Cindie however wished to forego ICD placement.  He was seen by EP, Dr Cindie and does not wish to pursue ICD.   Complaining of abdominal bloating. Able to walk around the grocery store without any symptoms. Able to walk 3 miles on the treadmill 3 times a week. Denies PND/Orthopnea. Denies syncope/presyncope. Appetite ok. No fever or chills. Weight at home 208-210 pounds. Taking all medications. He has not had flu shot .     Current Outpatient Medications  Medication Sig Dispense Refill   albuterol  (VENTOLIN  HFA) 108 (90 Base) MCG/ACT inhaler Inhale 2 puffs into the lungs every 6 (six) hours as needed for shortness of breath.     apixaban  (ELIQUIS ) 5 MG TABS tablet Take 1 tablet (5 mg total) by mouth 2 (two) times daily. 60 tablet 0   atorvastatin  (LIPITOR ) 80 MG tablet Take 1 tablet (80 mg total) by mouth  daily. 30 tablet 3   carvedilol  (COREG ) 3.125 MG tablet Take 1 tablet (3.125 mg total) by mouth 2 (two) times daily. 60 tablet 3   empagliflozin  (JARDIANCE ) 10 MG TABS tablet Take 1 tablet (10 mg total) by mouth daily before breakfast. 30 tablet 5   hydrALAZINE  (APRESOLINE ) 25 MG tablet Take 1 tablet (25 mg total) by mouth 2 (two) times daily.     isosorbide  mononitrate (IMDUR ) 30 MG 24 hr tablet Take 1 tablet (30 mg total) by mouth daily. 30 tablet 5   magnesium  oxide (MAG-OX) 400 MG tablet Take 400 mg by mouth daily.     pantoprazole  (PROTONIX ) 40 MG tablet Take 1 tablet (40 mg total) by mouth daily. 30 tablet 1   potassium chloride  SA (KLOR-CON  M) 20 MEQ tablet Take 2 tablets (40 mEq total) by mouth 2 (two) times daily. 120 tablet 0   torsemide  (DEMADEX ) 20 MG tablet Take 3 tablets (60 mg total) by mouth 2 (two) times daily. 180 tablet 3   No current facility-administered medications for this encounter.   PHYSICAL EXAM: Vitals:   06/04/24 1136  BP: (!) 140/92  Pulse: 64  SpO2: 96%    General:  Walked in the clinic.  No resp difficulty Neck: no JVD.  Cor: Regular rate & rhythm.  Lungs: clear Abdomen: soft, nontender, nondistended.  Extremities: no  edema Neuro: alert & oriented x3   DATA REVIEW  ECG: 07/27/23: sinus bradycardia   As per my personal interpretation  ECHO: 07/28/23: LVEF 30%, normal RV function as per my personal interpretation 04/02/24: LVEF 30-35%  CATH: 07/29/23: Angiographically minimal CAD in a Right dominant system Borderline Severe Pulmonary Hypertension-Pulmonary Venous Congestion from Combined Systolic and Diastolic Heart Failure Mean PAP 43 to 45 mmHg with PCWP of roughly 38 mmHg and LVEDP of 40 mmHg. Cardiac output-index by Fick 3.62-1.75 As per my personal interpretation  CMR: 08/07/23: 1. Normal LV size with diffuse hypokinesis worse in the septum, LV EF 32%.  2.  Normal RV size and systolic function, EF 60%.  3. Non-coronary LGE pattern.  Extensive mid-wall LGE in the basal to mid septum and inferior wall. Consider prior myocarditis, infiltrative disease, or possible familial arrhythmic cardiomyopathy. No wall thickening, does not appear consistent with hypertrophic cardiomyopathy. T2 is slightly elevated towards the apex but not in the basal to mid LV where there is LGE, probably not active myocarditis. 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 Would consider Cardiac PET evaluation.  ASSESSMENT & PLAN:  Herat failure with reduced EF Etiology of YQ:wnwpdryzfpr; LHC as above. 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  Cardiac PET consistent with active/early sarcoid. Echo ~ 30%.  NYHA class / AHA Stage:NYHA IIB Volume status & Diuretics: Appears euvolemic. Continue torsemide  60mg  daily.  Vasodilators: hydralazine  25mg  TID, continue imdur  to 30mg  daily.  He previously had an anaphylactic reaction to ACE inhibitors.  IBeta-Blocker: coreg  3.125mg  BID MRA: Start at follow-up Cardiometabolic:farxiga  10mg  Devices therapies & Valvulopathies: repeat TTE w/ LVEF of 30-35%. He met with Dr. Cindie but did not wish to proceed with ICD.  Advanced therapies:Not currently indicated.   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  - Cardiac MRI with non-coronary LGE  - Cardiac PET 02/12/24: FDG uptake consistent with sarcoid.  - CXR from 5/25 with hilar fullness - Discussed updating vaccinations with him.  - Patient does not wish to pursue ICD implant at this time.  Dr Cherrie discussed ICD as well. He also discussed this with Dr. Cindie. DrBensimhon discussed sarcoid treatment and he would like to pursue for 3 months. We have advised him to get Flu shot today. Discussed with Pharmacy and he will need to wait 2 weeks before start protocol.  Will set up follow up with Pharm D Clinic. Plan to keep on therapy 3 months and repeat cardiac PET to assess for suppression.  2. CKD IIIB - sCr ~1.6-1.7 at baseline w/ eGFR in the 40s.   3. NSVT Previously on amio . Given he does not want ICD will place 3 days Zio to assess for PVC/arrhythmia. If high burden will need to add AA. He currently does not want to pursue ICD>   4. Hypertension  - Stable. Continue current regimen.  - hydralazine  25mg  TID, imdur  30mg  - angioedema with ACEI; will hold off on addition of ARB/ARNI/ACE-I - continue coreg  3.125mg  BID  5. Nonobstructive CAD  - minimal CAD on LHC -No chest pain.  - continue statin   6. Hyperlipidemia  - LDL elevated at 125 - Lipitor  80mg  daily  7. COPD  - No wheeze.  - followed by pulmonology  - stable  Follow up in 2 weeks with Pharm D to start Sarcoid Protocol.   Caprice Wasko NP-C

## 2024-06-04 NOTE — Patient Instructions (Addendum)
 Medication Changes:  PLEASE GET YOUR FLU SHOT TODAY   Testing/Procedures:  Your provider has recommended that  you wear a Zio Patch for 3 days.  This monitor will record your heart rhythm for our review.  IF you have any symptoms while wearing the monitor please press the button.  If you have any issues with the patch or you notice a red or orange light on it please call the company at [phone].  Once you remove the patch please mail it back to the company as soon as possible so we can get the results.  Follow-Up in: 2 WEEKS WITH PHARMACY FOR SARCOID PROTOCOL AS SCHEDULED ON 10/16 AT 12:PM   At the [facility], you and your health needs are our priority. We have a designated team specialized in the treatment of Heart Failure. This Care Team includes your primary Heart Failure Specialized Cardiologist (physician), Advanced Practice Providers (APPs- Physician Assistants and Nurse Practitioners), and Pharmacist who all work together to provide you with the care you need, when you need it.   You may see any of the following providers on your designated Care Team at your next follow up:  Dr. Toribio Fuel Dr. Ezra Shuck Dr. Ria Commander Dr. Odis Brownie Greig Mosses, NP Caffie Shed, [place] [facility], [place] Beckey Coe, NP [place] Lee, NP Tinnie Redman, PharmD   Please be sure to bring in all your medications bottles to every appointment.   Need to Contact Us :  If you have any questions or concerns before your next appointment please send us  a message through [place] or call our office at [phone].    TO LEAVE A MESSAGE FOR THE NURSE SELECT OPTION 2, PLEASE LEAVE A MESSAGE INCLUDING: YOUR NAME DATE OF BIRTH CALL BACK NUMBER REASON FOR CALL**this is important as we prioritize the call backs  YOU WILL RECEIVE A CALL BACK THE SAME DAY AS LONG AS YOU CALL BEFORE 4:00 PM

## 2024-06-04 NOTE — Progress Notes (Signed)
 Zio patch placed onto patient.  All instructions and information reviewed with patient, they verbalize understanding with no questions.

## 2024-06-16 ENCOUNTER — Telehealth (HOSPITAL_COMMUNITY): Payer: Self-pay | Admitting: Cardiology

## 2024-06-16 NOTE — Telephone Encounter (Signed)
 [facility] called to report [facility] needs cardiology documentation confirming its ok to change from ED shots (unsure of medication name) back to Viagra pills since patient is taking isosorbide . Reports this was discussed with Dr Gardenia and provider stated Viagra should be fine        Please advise

## 2024-06-17 ENCOUNTER — Other Ambulatory Visit (HOSPITAL_COMMUNITY): Payer: Self-pay

## 2024-06-17 ENCOUNTER — Ambulatory Visit (HOSPITAL_COMMUNITY)
Admission: RE | Admit: 2024-06-17 | Discharge: 2024-06-17 | Disposition: A | Discharge status: Home / Self Care | Payer: Self-pay | Source: Ambulatory Visit | Attending: Internal Medicine | Admitting: Internal Medicine

## 2024-06-17 VITALS — BP 118/76 | HR 62 | Wt 211.8 lb

## 2024-06-17 DIAGNOSIS — I2510 Atherosclerotic heart disease of native coronary artery without angina pectoris: Secondary | ICD-10-CM | POA: Insufficient documentation

## 2024-06-17 DIAGNOSIS — I472 Ventricular tachycardia, unspecified: Secondary | ICD-10-CM | POA: Insufficient documentation

## 2024-06-17 DIAGNOSIS — E785 Hyperlipidemia, unspecified: Secondary | ICD-10-CM | POA: Insufficient documentation

## 2024-06-17 DIAGNOSIS — N1832 Chronic kidney disease, stage 3b: Secondary | ICD-10-CM | POA: Insufficient documentation

## 2024-06-17 DIAGNOSIS — Z79899 Other long term (current) drug therapy: Secondary | ICD-10-CM | POA: Insufficient documentation

## 2024-06-17 DIAGNOSIS — I130 Hypertensive heart and chronic kidney disease with heart failure and stage 1 through stage 4 chronic kidney disease, or unspecified chronic kidney disease: Secondary | ICD-10-CM | POA: Insufficient documentation

## 2024-06-17 DIAGNOSIS — G4733 Obstructive sleep apnea (adult) (pediatric): Secondary | ICD-10-CM | POA: Insufficient documentation

## 2024-06-17 DIAGNOSIS — I5022 Chronic systolic (congestive) heart failure: Secondary | ICD-10-CM | POA: Insufficient documentation

## 2024-06-17 DIAGNOSIS — J449 Chronic obstructive pulmonary disease, unspecified: Secondary | ICD-10-CM | POA: Insufficient documentation

## 2024-06-17 DIAGNOSIS — D8685 Sarcoid myocarditis: Secondary | ICD-10-CM

## 2024-06-17 DIAGNOSIS — D869 Sarcoidosis, unspecified: Secondary | ICD-10-CM | POA: Insufficient documentation

## 2024-06-17 MED ORDER — PREDNISONE 10 MG PO TABS: 30.0000 mg | ORAL_TABLET | Freq: Every day | ORAL | 1 refills | Status: DC

## 2024-06-17 MED ORDER — HYDRALAZINE HCL 10 MG PO TABS: 10.0000 mg | ORAL_TABLET | Freq: Three times a day (TID) | ORAL | 11 refills | Status: DC

## 2024-06-17 MED ORDER — METHOTREXATE SODIUM 2.5 MG PO TABS: ORAL_TABLET | ORAL | 1 refills | Status: DC

## 2024-06-17 MED ORDER — CALCIUM 500 MG PO TABS: 1200.0000 mg | ORAL_TABLET | Freq: Every day | ORAL | Status: DC

## 2024-06-17 MED ORDER — PANTOPRAZOLE SODIUM 40 MG PO TBEC: 40.0000 mg | DELAYED_RELEASE_TABLET | Freq: Every day | ORAL | 3 refills | Status: DC

## 2024-06-17 MED ORDER — VITAMIN D (CHOLECALCIFEROL) 10 MCG (400 UNIT) PO CAPS: 800.0000 [IU] | ORAL_CAPSULE | Freq: Every day | ORAL | Status: DC

## 2024-06-17 MED ORDER — SULFAMETHOXAZOLE-TRIMETHOPRIM 800-160 MG PO TABS: 1.0000 | ORAL_TABLET | ORAL | 11 refills | Status: DC

## 2024-06-17 MED ORDER — FOLIC ACID 1 MG PO TABS: 1.0000 mg | ORAL_TABLET | Freq: Every day | ORAL | 3 refills | Status: DC

## 2024-06-17 MED ORDER — SILDENAFIL CITRATE 50 MG PO TABS: 50.0000 mg | ORAL_TABLET | Freq: Every day | ORAL | 0 refills | Status: DC | PRN

## 2024-06-17 MED ORDER — POTASSIUM CHLORIDE CRYS ER 20 MEQ PO TBCR: EXTENDED_RELEASE_TABLET | ORAL | 0 refills | Status: DC

## 2024-06-17 NOTE — Progress Notes (Signed)
 Advanced Heart Failure Clinic Note  PCP: [facility] PCP-Cardiologist: None HF-Cardiologist: Ria Commander, DO  HPI:  Kevin Heath is a 68 y.o. 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    His cardiac history dates back to at least June 2024 when echocardiogram demonstrated EF of 40 to 45%.  He was admitted in August 2024 to the [facility] emergency department with chest pain and shortness of breath, received IV Lasix  and was discharged home.  He establish care with us  in November 2024 when he presented to [facility] with COPD and HFrEF exacerbation.  During that admission echocardiogram noted EF of 30%.  Underwent right and left heart catheterization which demonstrated minimal nonobstructive CAD and a Fick cardiac index of 1.8 L/min/m.  He was diuresed, started on GDMT and discharged home. CMR with LGE distribution concerning for sarcoid. CXR with hilar adenopathy.    Interval history - PET scan on 02/12/24 positive for cardiac sarcoidosis. - He had repeat TTE in 8/25 with EF of 30 to 35%.  Patient saw Dr. Cindie however wished to forego ICD placement.   Today Kevin Heath returns to Heart Failure Clinic for pharmacist medication titration. Reports feeling great. Reports only occasional orthostasis. Denies dizziness lightheadedness fatigue chest pain palpitations SOB LEE orthopnea PND. Reports being able to complete all activities of daily living (ADLs). Is active throughout the day. Weight at home is ~208 pounds. Takes torsemide  60 mg daily. Appetite is good. Does follow a low sodium diet.   Current Heart Failure Medications: Loop diuretic: Torsemide  60 mg daily Beta-Blocker: carvedilol  3.125 mg BID ACEI/ARB/ARNI: none (angioedema to ACEi) MRA: none (gynecomastia to spironolactone) SGLT2i: Jardiance  10 mg daily Other:  hydralazine  25 mg daily + Imdur  30 mg daily  Has the patient been experiencing any side effects to the medications prescribed? Yes, patient reports erectile dysfunction on carvedilol . More distant history of angioedema on ACEi and gynecomastia on spironolactone.   Does the patient have any problems obtaining medications due to transportation or finances? No  Understanding of regimen: Good  Understanding of indications: Good  Potential of adherence: Good  Patient understands to avoid NSAIDs.  Patient understands to avoid decongestants.  Pertinent Lab Values: Creatinine, Ser  Date Value Ref Range Status  05/06/2024 1.88 (H) 0.61 - 1.24 mg/dL Final   BUN  Date Value Ref Range Status  05/06/2024 17 8 - 23 mg/dL Final  92/89/7974 21 8 - 27 mg/dL Final   Potassium  Date Value Ref Range Status  05/06/2024 3.5 3.5 - 5.1 mmol/L Final   Sodium  Date Value Ref Range Status  05/06/2024 141 135 - 145 mmol/L Final  03/11/2024 140 134 - 144 mmol/L Final   B Natriuretic Peptide  Date Value Ref Range Status  05/06/2024 144.8 (H) 0.0 - 100.0 pg/mL Final    Comment:    Performed at [facility], [address], [place], [place] [address]   Magnesium   Date Value Ref Range Status  08/02/2023 1.9 1.7 - 2.4 mg/dL Final    Comment:    Performed at [facility], [address] [place], [place] [address]   TSH  Date Value Ref Range Status  07/26/2023 0.981 0.350 - 4.500 uIU/mL Final    Comment:    Performed by a 3rd Generation assay with a functional sensitivity of <=0.01 uIU/mL. Performed at [facility], [address] [place], [place] [address]    DATA REVIEW   ECG: 07/27/23: sinus bradycardia   As per my personal interpretation   ECHO: 07/28/23: LVEF 30%, normal RV function as per my personal interpretation 04/02/24: LVEF 30-35%   CATH: 07/29/23: Angiographically minimal CAD in a Right dominant system Borderline Severe Pulmonary Hypertension-Pulmonary  Venous Congestion from Combined Systolic and Diastolic Heart Failure Mean PAP 43 to 45 mmHg with PCWP of roughly 38 mmHg and LVEDP of 40 mmHg. Cardiac output-index by Fick 3.62-1.75 As per my personal interpretation   CMR 08/07/23: 1. Normal LV size with diffuse hypokinesis worse in the septum, LV EF 32%.  2.  Normal RV size and systolic function, EF 60%.  3. Non-coronary LGE pattern. Extensive mid-wall LGE in the basal to mid septum and inferior wall. Consider prior myocarditis, infiltrative disease, or possible familial arrhythmic cardiomyopathy. No wall thickening, does not appear consistent with hypertrophic cardiomyopathy. T2 is slightly elevated towards the apex but not in the basal to mid LV where there is LGE, probably not active myocarditis. 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 Would consider Cardiac PET evaluation.  Cardiac PET 02/15/24:   FDG uptake findings are consistent with active myocardial inflammation/sarcoidosis.   FDG uptake was observed. FDG uptake was diffuse. FDG uptake was present in the apical-mid anterior wall, mid to basal lateral and septal segment(s), including the anterolateral papillary muscle. LV perfusion is normal. Findings may represent early involvement of cardiac sarcoidosis.   Left ventricular function is abnormal. Global function is moderately reduced. EF: 30%. End diastolic cavity size is mildly enlarged. End systolic cavity size is mildly enlarged.   Coronary calcium  was present on the attenuation correction CT images. Minimal coronary calcifications were present. Coronary calcifications were present in the right coronary artery distribution(s).  Vital Signs: Today's Vitals   06/17/24 1213  BP: 118/76  Pulse: 62  SpO2: 95%  Weight: 211 lb 12 oz (96.1 kg)   Assessment/Plan: Heart failure with reduced EF Etiology of YQ:wnwpdryzfpr; LHC as above. CMR with  noncoronary LGE pattern possibly 2/2 myocarditis vs sarcoid.  Cardiac PET consistent with active/early sarcoid. NYHA class / AHA Stage:NYHA IIB Volume status & Diuretics: torsemide  60mg  daily.  Vasodilators: Taking hydralazine  on once daily. Increase hydralazine  to 10 mg TID. He previously had an anaphylactic reaction to ACE inhibitors.  Discussed low cross-reactivity of ARB's with the patient today and at this time he would like to hold off on starting. Beta-Blocker: Continue Coreg  3.125mg  BID; HR ok today. MRA: Did not tolerate spironolactone due to breast tenderness, consider eplerenone  at next visit if labs and BP are stable. Did not start today due to the multitude of medications added today. Cardiometabolic: Continue Jardiance  10mg  daily Devices therapies & Valvulopathies: repeat TTE w/ LVEF of 30-35%. He met with Dr. Cindie but did not wish to proceed with ICD. We discussed risks/benefits. Will proceed with treatment of sarcoid.  Advanced therapies:Not currently indicated.    2. Cardiac sarcoid - History of frequent PVCs - Zio 09/2023: mostly NSR 48 runs of SVT, 2 hrs and 28 mins of AF (2% AF burden), rare PVCs and PACs.  - Cardiac MRI with non-coronary LGE  - Cardiac PET 02/12/24: FDG uptake consistent with sarcoid.  - CXR from 01/2024 with hilar fullness. Asymptomatic currently.  - Baseline labs ok at initial MD visit, check again in 2 weeks at follow-up visit after starting sarcoid treatment. - Received all appropriate vaccinations.  -  Patient previously reported he does not wish to pursue ICD implant at this time.   -Start prednisone 30 mg daily -Start methotrexate 10 mg once weekly -Start folic acid 1 mg daily -Start OTC vitamin D and Calcium  -Continue pantoprazole  40 mg daily -Start Bactrim DS every MWF and reduce Kcl to 40 meq in the AM and 20 meq in the PM. He may need further reduction, however, most recent potassium labs are <4. -CMP and CBC in 2 weeks   2. CKD IIIB -  Repeat BMP/BNP next visit   3. NSVT - Currently off amiodarone .    4. Hypertension  - hydralazine  10 mg TID - angioedema with ACEI; will hold off on addition of ARB - continue Coreg  3.125mg  BID   5. Nonobstructive CAD  - minimal CAD on LHC - continue atorvastatin     6. Hyperlipidemia  - LDL elevated at 125 - Lipitor  80mg  daily - repeat lipid panel in 3 months (3/25)   7. COPD  - stable on exam today - followed by pulmonology  - stable  Follow up: in 2 weeks with pharmacist  Please do not hesitate to reach out with questions or concerns,  Jaun Bash, PharmD, CPP, BCPS, [facility] Heart Failure Pharmacist  Phone - [phone] 06/17/2024 2:54 PM

## 2024-06-17 NOTE — Patient Instructions (Addendum)
 It was a pleasure seeing you today!  MEDICATIONS: -We are changing your medications today -Start taking methotrexate 10 mg (4 tablets) once WEEKLY -Start taking folic acid 1 mg daily -Start taking prednisone 30 mg (3 tablets) daily -Start taking Calcium  [phone] mg daily and Vitamin D [phone] international  daily over the counter. -Start taking Bactrim DS 1 tablet every Monday, Wednesday, and Friday -Continue taking pantoprazole  40 mg daily -Call if you have questions about your medications. -Change hydralazine  to 10 mg three times daily -STOP taking Imdur  (isosorbide ) as this interacts with Viagra  LABS: -We will call you if your labs need attention.  NEXT APPOINTMENT: Return to clinic in 2 weeks for labs.  In general, to take care of your heart failure: -Limit your fluid intake to 2 Liters (half-gallon) per day.   -Limit your salt intake to ideally 2-3 grams (2000-3000 mg) per day. -Weigh yourself daily and record, and bring that weight diary to your next appointment.  (Weight gain of 2-3 pounds in 1 day typically means fluid weight.) -The medications for your heart are to help your heart and help you live longer.   -Please contact us  before stopping any of your heart medications.  Call the clinic at [phone] with questions or to reschedule future appointments.

## 2024-06-18 ENCOUNTER — Other Ambulatory Visit (HOSPITAL_COMMUNITY): Payer: Self-pay

## 2024-06-18 MED ORDER — FOLIC ACID 1 MG PO TABS: 1.0000 mg | ORAL_TABLET | Freq: Every day | ORAL | 3 refills | Status: DC

## 2024-06-18 MED ORDER — POTASSIUM CHLORIDE CRYS ER 20 MEQ PO TBCR: EXTENDED_RELEASE_TABLET | ORAL | 0 refills | Status: AC

## 2024-06-18 MED ORDER — METHOTREXATE SODIUM 2.5 MG PO TABS: ORAL_TABLET | ORAL | 1 refills | Status: DC

## 2024-06-18 MED ORDER — HYDRALAZINE HCL 10 MG PO TABS: 10.0000 mg | ORAL_TABLET | Freq: Three times a day (TID) | ORAL | 11 refills | Status: AC

## 2024-06-18 MED ORDER — PANTOPRAZOLE SODIUM 40 MG PO TBEC: 40.0000 mg | DELAYED_RELEASE_TABLET | Freq: Every day | ORAL | 3 refills | Status: DC

## 2024-06-18 MED ORDER — EMPAGLIFLOZIN 10 MG PO TABS: 10.0000 mg | ORAL_TABLET | Freq: Every day | ORAL | 5 refills | Status: AC

## 2024-06-18 MED ORDER — PREDNISONE 10 MG PO TABS: 30.0000 mg | ORAL_TABLET | Freq: Every day | ORAL | 1 refills | Status: DC

## 2024-06-18 MED ORDER — SILDENAFIL CITRATE 50 MG PO TABS: 50.0000 mg | ORAL_TABLET | Freq: Every day | ORAL | 0 refills | Status: DC | PRN

## 2024-06-18 MED ORDER — TORSEMIDE 20 MG PO TABS: 60.0000 mg | ORAL_TABLET | Freq: Two times a day (BID) | ORAL | 3 refills | Status: DC

## 2024-06-18 MED ORDER — CARVEDILOL 3.125 MG PO TABS: 3.1250 mg | ORAL_TABLET | Freq: Two times a day (BID) | ORAL | 3 refills | Status: DC

## 2024-06-18 MED ORDER — ATORVASTATIN CALCIUM 80 MG PO TABS: 80.0000 mg | ORAL_TABLET | Freq: Every day | ORAL | 3 refills | Status: AC

## 2024-06-18 MED ORDER — APIXABAN 5 MG PO TABS: 5.0000 mg | ORAL_TABLET | Freq: Two times a day (BID) | ORAL | 0 refills | Status: AC

## 2024-06-18 MED ORDER — SULFAMETHOXAZOLE-TRIMETHOPRIM 800-160 MG PO TABS: 1.0000 | ORAL_TABLET | ORAL | 11 refills | Status: DC

## 2024-06-18 NOTE — Progress Notes (Signed)
 Patient called and reports all RX;s sent to [facility] will need to be sent to [facility]  Scripts sent

## 2024-06-18 NOTE — Addendum Note (Signed)
 Encounter addended by: Jerona Dalton HERO, CMA on: 06/18/2024 10:36 AM  Actions taken: Pharmacy for encounter modified, Order list changed, Clinical Note Signed

## 2024-06-21 ENCOUNTER — Telehealth (HOSPITAL_COMMUNITY): Payer: Self-pay | Admitting: Pharmacist

## 2024-06-21 NOTE — Telephone Encounter (Signed)
 Left voicemail 06/18/24 for the patient stating that the [facility] denies any [facility] care privileges for this patient and will not fill prescriptions sent from the AHF clinic. The patient will need to ask his VA PCP to send in prescriptions until he has permission for community cares. Attempted to call the patient again today with no response to either listed number.

## 2024-07-01 ENCOUNTER — Ambulatory Visit (HOSPITAL_COMMUNITY)

## 2024-07-07 NOTE — Telephone Encounter (Signed)
   I have attempted without success to contact this patient by phone WILL CLOSE ENCOUNTER

## 2024-07-08 ENCOUNTER — Telehealth (HOSPITAL_COMMUNITY): Payer: Self-pay

## 2024-07-08 DIAGNOSIS — D869 Sarcoidosis, unspecified: Secondary | ICD-10-CM

## 2024-07-08 MED ORDER — VITAMIN D (CHOLECALCIFEROL) 10 MCG (400 UNIT) PO CAPS: 800.0000 [IU] | ORAL_CAPSULE | Freq: Every day | ORAL | 1 refills | Status: AC

## 2024-07-08 MED ORDER — CALCIUM 500 MG PO TABS: 1500.0000 mg | ORAL_TABLET | Freq: Every day | ORAL | 1 refills | Status: AC

## 2024-07-08 MED ORDER — SULFAMETHOXAZOLE-TRIMETHOPRIM 800-160 MG PO TABS: 1.0000 | ORAL_TABLET | ORAL | 2 refills | Status: AC

## 2024-07-08 MED ORDER — FOLIC ACID 1 MG PO TABS: 1.0000 mg | ORAL_TABLET | Freq: Every day | ORAL | 3 refills | Status: AC

## 2024-07-08 MED ORDER — METHOTREXATE SODIUM 2.5 MG PO TABS: ORAL_TABLET | ORAL | 1 refills | Status: DC

## 2024-07-08 MED ORDER — PANTOPRAZOLE SODIUM 40 MG PO TBEC: 40.0000 mg | DELAYED_RELEASE_TABLET | Freq: Every day | ORAL | 3 refills | Status: AC

## 2024-07-08 MED ORDER — PREDNISONE 10 MG PO TABS: 30.0000 mg | ORAL_TABLET | Freq: Every day | ORAL | 1 refills | Status: DC

## 2024-07-08 NOTE — Telephone Encounter (Signed)
 Contacted patient to follow up on sarcoid treatment plan given issues with [facility] coverage. He states he got the permission for community care from the [place] last Thursday. He was able to start a 10 day supply of the medications initially but needs new prescriptions sent to the [place] to continue the treatment course.   Since he was just able to start his therapy, will adjust the appts to lab for CMP and CBC diff next week, and then will bring back to pharmacy clinic for 1 month after starting therapy. Patient confirms instructions. Will send new prescriptions and lab orders.

## 2024-07-14 ENCOUNTER — Ambulatory Visit (HOSPITAL_COMMUNITY)

## 2024-07-14 ENCOUNTER — Ambulatory Visit (HOSPITAL_COMMUNITY)
Admission: RE | Admit: 2024-07-14 | Discharge: 2024-07-14 | Disposition: A | Discharge status: Home / Self Care | Source: Ambulatory Visit | Attending: Internal Medicine | Admitting: Internal Medicine

## 2024-07-14 DIAGNOSIS — D869 Sarcoidosis, unspecified: Secondary | ICD-10-CM | POA: Insufficient documentation

## 2024-07-14 LAB — COMPREHENSIVE METABOLIC PANEL WITH GFR
ALT: 26 U/L (ref 0–44)
AST: 26 U/L (ref 15–41)
Albumin: 3 g/dL — ABNORMAL LOW (ref 3.5–5.0)
Alkaline Phosphatase: 89 U/L (ref 38–126)
Anion gap: 14 (ref 5–15)
BUN: 34 mg/dL — ABNORMAL HIGH (ref 8–23)
CO2: 25 mmol/L (ref 22–32)
Calcium: 9.5 mg/dL (ref 8.9–10.3)
Chloride: 102 mmol/L (ref 98–111)
Creatinine, Ser: 2.09 mg/dL — ABNORMAL HIGH (ref 0.61–1.24)
GFR, Estimated: 34 mL/min — ABNORMAL LOW (ref >60)
Glucose, Bld: 287 mg/dL — ABNORMAL HIGH (ref 70–99)
Potassium: 4 mmol/L (ref 3.5–5.1)
Sodium: 141 mmol/L (ref 135–145)
Total Bilirubin: 0.9 mg/dL (ref 0.0–1.2)
Total Protein: 5.9 g/dL — ABNORMAL LOW (ref 6.5–8.1)

## 2024-07-14 LAB — CBC WITH DIFFERENTIAL/PLATELET
Abs Immature Granulocytes: 0.08 K/uL — ABNORMAL HIGH (ref 0.00–0.07)
Basophils Absolute: 0 K/uL (ref 0.0–0.1)
Basophils Relative: 0 %
Eosinophils Absolute: 0 K/uL (ref 0.0–0.5)
Eosinophils Relative: 0 %
HCT: 42.1 % (ref 39.0–52.0)
Hemoglobin: 13.8 g/dL (ref 13.0–17.0)
Immature Granulocytes: 1 %
Lymphocytes Relative: 9 %
Lymphs Abs: 1.4 K/uL (ref 0.7–4.0)
MCH: 29.6 pg (ref 26.0–34.0)
MCHC: 32.8 g/dL (ref 30.0–36.0)
MCV: 90.3 fL (ref 80.0–100.0)
Monocytes Absolute: 0.4 K/uL (ref 0.1–1.0)
Monocytes Relative: 3 %
Neutro Abs: 13.3 K/uL — ABNORMAL HIGH (ref 1.7–7.7)
Neutrophils Relative %: 87 %
Platelets: 220 K/uL (ref 150–400)
RBC: 4.66 MIL/uL (ref 4.22–5.81)
RDW: 14.4 % (ref 11.5–15.5)
WBC: 15.2 K/uL — ABNORMAL HIGH (ref 4.0–10.5)
nRBC: 0 % (ref 0.0–0.2)

## 2024-07-15 ENCOUNTER — Telehealth (HOSPITAL_COMMUNITY): Payer: Self-pay | Admitting: Pharmacist

## 2024-07-15 MED ORDER — TORSEMIDE 20 MG PO TABS: 60.0000 mg | ORAL_TABLET | Freq: Every day | ORAL | 3 refills | Status: DC

## 2024-07-15 NOTE — Telephone Encounter (Signed)
 Patient received cardiac sarcoid medications today. He complained about urinating too much and feeling dehydrated. Creatnine and BUN are up on labs yesterday. Instructed to take torsemide  once daily until follow-up next week.

## 2024-07-20 NOTE — Progress Notes (Signed)
 Advanced Heart Failure Clinic Note   HPI:  Kevin Heath is a 68 y.o. male hypertension, hyperlipidemia, chronic kidney disease, obstructive sleep apnea, COPD, HFrEF & cardiac sarcoid presenting today for medication management of cardiac sarcoidosis.    His cardiac history dates back to at least June 2024 when echocardiogram demonstrated EF of 40 to 45%.  He was admitted in August 2024 to the [facility] emergency department with chest pain and shortness of breath, received IV Lasix  and was discharged home.  He established care with us  in November 2024 when he presented to [facility] with COPD and HFrEF exacerbation.  During that admission echocardiogram noted EF of 30%.  Underwent right and left heart catheterization which demonstrated minimal nonobstructive CAD and a Fick cardiac index of 1.8 L/min/m.  He was diuresed, started on GDMT and discharged home. CMR with LGE distribution concerning for sarcoid. CXR with hilar adenopathy.    Interval history - PET scan on 02/12/24 positive for cardiac sarcoidosis. - He had repeat TTE in 8/25 with EF of 30 to 35%.  Patient saw Dr. Cindie however wished to forego ICD placement.  Today he returns to HF clinic for pharmacist medication titration with his wife. At the last visit, he reported feeling well but had occasional orthostasis. He was initiated on sarcoid treatment with prednisone , methotrexate , folic acid , vitamin D , calcium , pantoprazole , and Bactrim . There was a delay with receiving his medication from the [place]. He was able to get community care privileges approved and start therapy around the first week of November. He returned for labs on 11/12. Notably, creatinine was up from 1.88 to 2.09 and WBC from 6.9 to 15.2.  He states he feels a little worse than last visit. Reports shortness of breath on exertion, increased urination, and increased appetite. Denies shortness of breath at rest, orthopnea, or GI upset. 1+ bilateral LE edema. ReDS 28%. Weight is  up 10 lbs compared to last clinic visit. He has not weighed himself at home since he was in the office last. Has had increased dizziness, somewhat relieved when taking medications spaced out. BP has been ok at home, reported 110-130/80s.  Denies missing doses of medications. Was instructed to take hydralazine  TID during last visit but he misunderstood and has only been taking it twice daily.    HF Medications: Torsemide  60 mg daily - reduced from BID on 11/12 Carvedilol  3.125 mg BID Jardiance  10 mg daily Hydralazine  10 mg TID - taking as BID (misunderstood directions last visit)  Prednisone  30 mg daily Methotrexate  10 mg once weekly - Sundays Folic acid  1 mg daily Vitamin D  and Calcium  OTC Pantoprazole  40 mg daily Bactrim  DS every MWF   Has the patient been experiencing any side effects to the medications prescribed?  Yes - increased appetite, increased urination, dizziness  Does the patient have any problems obtaining medications due to transportation or finances?   No - now has community care with the [place]  Understanding of regimen: fair-good Understanding of indications: good Potential of compliance: excellent Patient understands to avoid NSAIDs. Patient understands to avoid decongestants.    Pertinent Lab Values: 11/12: Serum creatinine 2.09, BUN 34, Potassium 4, Sodium 141   Vital Signs: Weight: 221 (last clinic weight: 211 lbs) Blood pressure: 138/78 mmHg  Heart rate: 66 bpm   Assessment/Plan: Heart failure with reduced EF Etiology of HF: nonischemic; LHC as above. CMR with noncoronary LGE pattern possibly 2/2 myocarditis vs sarcoid.  Cardiac PET consistent with active/early sarcoid. NYHA class /  AHA Stage:NYHA III Volume status & Diuretics: currently taking torsemide  60mg  daily - reduced on 11/12 with creatinine increase on labs. Volume up today with shortness of breath and LE edema. ReDS 28%. Repeat BMET and BNP today. Take additional torsemide  60 mg tonight, then  increase to 80 mg daily starting tomorrow given symptoms.  Vasodilators: Continue hydralazine  10 mg TID. He has only been taking this twice daily - he will begin taking this TID to help lower BP. He previously had an anaphylactic reaction to ACE inhibitors.  Beta-Blocker: Continue carvedilol  3.125 mg BID; HR ok today  MRA: Did not tolerate spironolactone due to breast tenderness, consider eplerenone  at next visit if labs and BP are stable.  Cardiometabolic: Continue Jardiance  10 mg daily Devices therapies & Valvulopathies: repeat TTE w/ LVEF of 30-35%. He met with Dr. Cindie but did not wish to proceed with ICD.  Advanced therapies: Not currently indicated.    2. Cardiac sarcoid - History of frequent PVCs - Zio 09/2023: mostly NSR 48 runs of SVT, 2 hrs and 28 mins of AF (2% AF burden), rare PVCs and PACs.  - Cardiac MRI with non-coronary LGE  - Cardiac PET 02/12/24: FDG uptake consistent with sarcoid.  - CXR from 01/2024 with hilar fullness. Asymptomatic currently.  - Baseline labs ok at initial MD visit, creatinine up on repeat labs 11/12 about 2 weeks after starting therapy. Repeat BMET and angiotensin converting enzyme today and again in 1 month. - Received all appropriate vaccinations.  - Patient previously reported he does not wish to pursue ICD implant at this time.   - Decrease prednisone  to 20 mg daily - Increase methotrexate  to 15 mg once weekly - Continue folic acid  1 mg daily - Continue OTC vitamin D  and Calcium  - Continue pantoprazole  40 mg daily - Continue Bactrim  DS every MWF - CMP and CBC in 3 weeks   2. CKD IIIB - Repeat BMP/BNP next visit   3. NSVT - Currently off amiodarone .    4. Hypertension  - hydralazine  10 mg TID - has been taking as BID, instructed to increase to TID - angioedema with ACEI; will hold off on addition of ARB - continue carvedilol  3.125 mg BID   5. Nonobstructive CAD  - minimal CAD on LHC - continue atorvastatin     6. Hyperlipidemia  -  Lipitor  80mg  daily   7. COPD  - stable on exam today - followed by pulmonology   Follow up in 1 month with lab and pharmacist   Duwaine Plant, PharmD, BCPS Heart Failure Clinic Pharmacist [phone]

## 2024-07-21 ENCOUNTER — Ambulatory Visit (HOSPITAL_COMMUNITY)
Admission: RE | Admit: 2024-07-21 | Discharge: 2024-07-21 | Disposition: A | Discharge status: Home / Self Care | Source: Ambulatory Visit | Attending: Cardiology | Admitting: Cardiology

## 2024-07-21 VITALS — BP 138/78 | HR 66 | Ht 66.0 in | Wt 221.0 lb

## 2024-07-21 DIAGNOSIS — I130 Hypertensive heart and chronic kidney disease with heart failure and stage 1 through stage 4 chronic kidney disease, or unspecified chronic kidney disease: Secondary | ICD-10-CM | POA: Insufficient documentation

## 2024-07-21 DIAGNOSIS — Z7984 Long term (current) use of oral hypoglycemic drugs: Secondary | ICD-10-CM | POA: Insufficient documentation

## 2024-07-21 DIAGNOSIS — I2510 Atherosclerotic heart disease of native coronary artery without angina pectoris: Secondary | ICD-10-CM | POA: Insufficient documentation

## 2024-07-21 DIAGNOSIS — E785 Hyperlipidemia, unspecified: Secondary | ICD-10-CM | POA: Insufficient documentation

## 2024-07-21 DIAGNOSIS — D869 Sarcoidosis, unspecified: Secondary | ICD-10-CM | POA: Insufficient documentation

## 2024-07-21 DIAGNOSIS — I5022 Chronic systolic (congestive) heart failure: Secondary | ICD-10-CM | POA: Insufficient documentation

## 2024-07-21 DIAGNOSIS — J449 Chronic obstructive pulmonary disease, unspecified: Secondary | ICD-10-CM | POA: Insufficient documentation

## 2024-07-21 DIAGNOSIS — I472 Ventricular tachycardia, unspecified: Secondary | ICD-10-CM | POA: Insufficient documentation

## 2024-07-21 DIAGNOSIS — Z79899 Other long term (current) drug therapy: Secondary | ICD-10-CM | POA: Insufficient documentation

## 2024-07-21 DIAGNOSIS — G4733 Obstructive sleep apnea (adult) (pediatric): Secondary | ICD-10-CM | POA: Insufficient documentation

## 2024-07-21 DIAGNOSIS — N1832 Chronic kidney disease, stage 3b: Secondary | ICD-10-CM | POA: Insufficient documentation

## 2024-07-21 LAB — BASIC METABOLIC PANEL WITH GFR
Anion gap: 14 (ref 5–15)
BUN: 34 mg/dL — ABNORMAL HIGH (ref 8–23)
CO2: 27 mmol/L (ref 22–32)
Calcium: 9.8 mg/dL (ref 8.9–10.3)
Chloride: 98 mmol/L (ref 98–111)
Creatinine, Ser: 1.99 mg/dL — ABNORMAL HIGH (ref 0.61–1.24)
GFR, Estimated: 36 mL/min — ABNORMAL LOW (ref >60)
Glucose, Bld: 280 mg/dL — ABNORMAL HIGH (ref 70–99)
Potassium: 3.7 mmol/L (ref 3.5–5.1)
Sodium: 139 mmol/L (ref 135–145)

## 2024-07-21 MED ORDER — METHOTREXATE SODIUM 2.5 MG PO TABS: ORAL_TABLET | ORAL | 1 refills | Status: DC

## 2024-07-21 MED ORDER — TORSEMIDE 20 MG PO TABS: 80.0000 mg | ORAL_TABLET | Freq: Every day | ORAL | 3 refills | Status: AC

## 2024-07-21 MED ORDER — PREDNISONE 10 MG PO TABS: 20.0000 mg | ORAL_TABLET | Freq: Every day | ORAL | 1 refills | Status: DC

## 2024-07-21 NOTE — Patient Instructions (Addendum)
 It was a pleasure seeing you today!  MEDICATIONS: -We are changing your medications today -Take 3 tablets of torsemide  tonight then starting tomorrow, increase to 80 mg once daily - Increase methotrexate to 5 tablets (15 mg) once weekly - Decrease prednisone to 2 tablets (20 mg) daily -Call if you have questions about your medications.  LABS: -We will call you if your labs need attention.  NEXT APPOINTMENT: Return to clinic in 1 month with pharmacy and lab.  In general, to take care of your heart failure: -Limit your fluid intake to 2 Liters (half-gallon) per day.   -Limit your salt intake to ideally 2-3 grams (2000-3000 mg) per day. -Weigh yourself daily and record, and bring that weight diary to your next appointment.  (Weight gain of 2-3 pounds in 1 day typically means fluid weight.) -The medications for your heart are to help your heart and help you live longer.   -Please contact us  before stopping any of your heart medications.  Call the clinic at [phone] with questions or to reschedule future appointments.

## 2024-07-22 ENCOUNTER — Telehealth (HOSPITAL_COMMUNITY): Payer: Self-pay | Admitting: Cardiology

## 2024-07-22 LAB — ANGIOTENSIN CONVERTING ENZYME: Angiotensin-Converting Enzyme: 18 U/L (ref 14–82)

## 2024-07-22 MED ORDER — METHOTREXATE SODIUM 2.5 MG PO TABS: ORAL_TABLET | ORAL | 1 refills | Status: DC

## 2024-07-22 NOTE — Telephone Encounter (Signed)
 [facility]    Per pharmacy note  Increase methotrexate  to 15 mg once weekly   Pharmacy aware, med list updated

## 2024-08-20 ENCOUNTER — Ambulatory Visit (HOSPITAL_COMMUNITY)
Admission: RE | Admit: 2024-08-20 | Discharge: 2024-08-20 | Disposition: A | Discharge status: Home / Self Care | Source: Ambulatory Visit | Attending: Cardiology | Admitting: Cardiology

## 2024-08-20 DIAGNOSIS — I5022 Chronic systolic (congestive) heart failure: Secondary | ICD-10-CM | POA: Insufficient documentation

## 2024-08-20 LAB — COMPREHENSIVE METABOLIC PANEL WITH GFR
ALT: 26 U/L (ref 0–44)
AST: 27 U/L (ref 15–41)
Albumin: 3.7 g/dL (ref 3.5–5.0)
Alkaline Phosphatase: 109 U/L (ref 38–126)
Anion gap: 13 (ref 5–15)
BUN: 39 mg/dL — ABNORMAL HIGH (ref 8–23)
CO2: 27 mmol/L (ref 22–32)
Calcium: 10.1 mg/dL (ref 8.9–10.3)
Chloride: 98 mmol/L (ref 98–111)
Creatinine, Ser: 1.88 mg/dL — ABNORMAL HIGH (ref 0.61–1.24)
GFR, Estimated: 39 mL/min — ABNORMAL LOW
Glucose, Bld: 553 mg/dL (ref 70–99)
Potassium: 3.5 mmol/L (ref 3.5–5.1)
Sodium: 138 mmol/L (ref 135–145)
Total Bilirubin: 0.5 mg/dL (ref 0.0–1.2)
Total Protein: 5.9 g/dL — ABNORMAL LOW (ref 6.5–8.1)

## 2024-08-20 LAB — CBC WITH DIFFERENTIAL/PLATELET
Abs Immature Granulocytes: 0.19 K/uL — ABNORMAL HIGH (ref 0.00–0.07)
Basophils Absolute: 0 K/uL (ref 0.0–0.1)
Basophils Relative: 0 %
Eosinophils Absolute: 0.1 K/uL (ref 0.0–0.5)
Eosinophils Relative: 1 %
HCT: 43.4 % (ref 39.0–52.0)
Hemoglobin: 14 g/dL (ref 13.0–17.0)
Immature Granulocytes: 2 %
Lymphocytes Relative: 19 %
Lymphs Abs: 2.4 K/uL (ref 0.7–4.0)
MCH: 30 pg (ref 26.0–34.0)
MCHC: 32.3 g/dL (ref 30.0–36.0)
MCV: 93.1 fL (ref 80.0–100.0)
Monocytes Absolute: 0.8 K/uL (ref 0.1–1.0)
Monocytes Relative: 6 %
Neutro Abs: 9.1 K/uL — ABNORMAL HIGH (ref 1.7–7.7)
Neutrophils Relative %: 72 %
Platelets: 204 K/uL (ref 150–400)
RBC: 4.66 MIL/uL (ref 4.22–5.81)
RDW: 15.6 % — ABNORMAL HIGH (ref 11.5–15.5)
WBC: 12.7 K/uL — ABNORMAL HIGH (ref 4.0–10.5)
nRBC: 0 % (ref 0.0–0.2)

## 2024-08-20 LAB — PRO BRAIN NATRIURETIC PEPTIDE: Pro Brain Natriuretic Peptide: 784 pg/mL — ABNORMAL HIGH

## 2024-08-20 NOTE — Progress Notes (Signed)
 " Advanced Heart Failure Clinic Note  PCP: [facility] PCP-Cardiologist: None HF-Cardiologist: Ria Commander, DO  HPI:  Kevin Heath is a 68 y.o. 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    His cardiac history dates back to at least June 2024 when echocardiogram demonstrated EF of 40 to 45%.  He was admitted in August 2024 to the [facility] emergency department with chest pain and shortness of breath, received IV Lasix  and was discharged home.  He establish care with us  in November 2024 when he presented to [facility] with COPD and HFrEF exacerbation.  During that admission echocardiogram noted EF of 30%.  Underwent right and left heart catheterization which demonstrated minimal nonobstructive CAD and a Fick cardiac index of 1.8 L/min/m.  He was diuresed, started on GDMT and discharged home. CMR with LGE distribution concerning for sarcoid. CXR with hilar adenopathy.    PET scan on 02/12/24 positive for cardiac sarcoidosis. He had repeat TTE in 04/2024 with EF of 30 to 35%.  Patient saw Dr. Cindie however wished to forego ICD placement. Seen by pharmacy 06/17/24 and initiated on cardiac sarcoidosis protocol, though start was delayed due to the [place] requiring community cares.   Last seen by CHF pharmacist was 07/21/24. Patient;s weight was up 10 lbs with 1+ LEE, though ReDS was 28%. Prednisone  was decreased to 20 mg daily and methotrexate  was increased to 15 mg daily.   Today [name] returns to Heart Failure Clinic for pharmacist medication titration. Reports feeling great. Reports only occasional orthostasis. Denies dizziness lightheadedness fatigue chest pain palpitations SOB LEE orthopnea PND. Reports being able to complete all activities of daily living (ADLs). Is active throughout the day. Weight at home is ~208 pounds.  Takes torsemide  60 mg daily. Appetite is good. Does follow a low sodium diet.   Current Heart Failure Medications: Loop diuretic: Torsemide  60 mg daily Beta-Blocker: carvedilol  3.125 mg BID ACEI/ARB/ARNI: none (angioedema to ACEi) MRA: none (gynecomastia to spironolactone) SGLT2i: Jardiance  10 mg daily Other: hydralazine  25 mg daily + Imdur  30 mg daily  Has the patient been experiencing any side effects to the medications prescribed? Yes, patient reports erectile dysfunction on carvedilol . More distant history of angioedema on ACEi and gynecomastia on spironolactone.   Does the patient have any problems obtaining medications due to transportation or finances? No  Understanding of regimen: Good  Understanding of indications: Good  Potential of adherence: Good  Patient understands to avoid NSAIDs.  Patient understands to avoid decongestants.  Pertinent Lab Values: Creatinine, Ser  Date Value Ref Range Status  08/20/2024 1.88 (H) 0.61 - 1.24 mg/dL Final   BUN  Date Value Ref Range Status  08/20/2024 39 (H) 8 - 23 mg/dL Final  92/89/7974 21 8 - 27 mg/dL Final   Potassium  Date Value Ref Range Status  08/20/2024 3.5 3.5 - 5.1 mmol/L Final    Comment:    HEMOLYSIS AT THIS LEVEL MAY AFFECT RESULT   Sodium  Date Value Ref Range Status  08/20/2024 138 135 - 145 mmol/L Final  03/11/2024 140 134 - 144 mmol/L Final   B Natriuretic Peptide  Date Value Ref Range Status  05/06/2024 144.8 (H) 0.0 - 100.0 pg/mL Final    Comment:    Performed at [facility], [address], [place], [place] [address]   Magnesium   Date Value Ref Range Status  08/02/2023  1.9 1.7 - 2.4 mg/dL Final    Comment:    Performed at [facility], [address] [place], [place] [address]   TSH  Date Value Ref Range Status  07/26/2023 0.981 0.350 - 4.500 uIU/mL Final    Comment:    Performed by a 3rd Generation assay with a functional sensitivity of <=0.01 uIU/mL. Performed at [facility], [address] [place], [place] [address]    DATA REVIEW   ECG: 07/27/23: sinus bradycardia   As per my personal interpretation   ECHO: 07/28/23: LVEF 30%, normal RV function as per my personal interpretation 04/02/24: LVEF 30-35%   CATH: 07/29/23: Angiographically minimal CAD in a Right dominant system Borderline Severe Pulmonary Hypertension-Pulmonary Venous Congestion from Combined Systolic and Diastolic Heart Failure Mean PAP 43 to 45 mmHg with PCWP of roughly 38 mmHg and LVEDP of 40 mmHg. Cardiac output-index by Fick 3.62-1.75 As per my personal interpretation   CMR 08/07/23: 1. Normal LV size with diffuse hypokinesis worse in the septum, LV EF 32%.  2.  Normal RV size and systolic function, EF 60%.  3. Non-coronary LGE pattern. Extensive mid-wall LGE in the basal to mid septum and inferior wall. Consider prior myocarditis, infiltrative disease, or possible familial arrhythmic cardiomyopathy. No wall thickening, does not appear consistent with hypertrophic cardiomyopathy. T2 is slightly elevated towards the apex but not in the basal to mid LV where there is LGE, probably not active myocarditis. 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 Would consider Cardiac PET evaluation.  Cardiac PET 02/15/24:   FDG uptake findings are consistent with active myocardial inflammation/sarcoidosis.   FDG uptake was observed. FDG uptake was diffuse. FDG uptake was present in the apical-mid anterior wall, mid to basal lateral and septal segment(s), including the anterolateral papillary muscle. LV perfusion is normal. Findings may represent early involvement of cardiac sarcoidosis.   Left ventricular function is abnormal. Global function is moderately reduced. EF: 30%. End diastolic cavity size is mildly enlarged. End systolic cavity size is mildly enlarged.   Coronary calcium  was present on the attenuation  correction CT images. Minimal coronary calcifications were present. Coronary calcifications were present in the right coronary artery distribution(s).  Vital Signs: There were no vitals filed for this visit.  Assessment/Plan: Heart failure with reduced EF Etiology of YQ:wnwpdryzfpr; LHC as above. CMR with noncoronary LGE pattern possibly 2/2 myocarditis vs sarcoid.  Cardiac PET consistent with active/early sarcoid. NYHA class / AHA Stage:NYHA IIB Volume status & Diuretics: torsemide  60mg  daily.  Vasodilators: Taking hydralazine  on once daily. Increase hydralazine  to 10 mg TID. He previously had an anaphylactic reaction to ACE inhibitors.  Discussed low cross-reactivity of ARB's with the patient today and at this time he would like to hold off on starting. Beta-Blocker: Continue Coreg  3.125mg  BID; HR ok today. MRA: Did not tolerate spironolactone due to breast tenderness, consider eplerenone  at next visit if labs and BP are stable. Did not start today due to the multitude of medications added today. Cardiometabolic: Continue Jardiance  10mg  daily Devices therapies & Valvulopathies: repeat TTE w/ LVEF of 30-35%. He met with Dr. Cindie but did not wish to proceed with ICD. We discussed risks/benefits. Will proceed with treatment of sarcoid.  Advanced therapies:Not currently indicated.    2. Cardiac sarcoid - History of frequent PVCs - [facility] 09/2023: mostly NSR 48 runs of SVT, 2 hrs and 28 mins of AF (2% AF burden), rare PVCs and  PACs.  - Cardiac MRI with non-coronary LGE  - Cardiac PET 02/12/24: FDG uptake consistent with sarcoid.  - CXR from 01/2024 with hilar fullness. Asymptomatic currently.  - Baseline labs ok at initial MD visit, check again in 2 weeks at follow-up visit after starting sarcoid treatment. - Received all appropriate vaccinations.  - Patient previously reported he does not wish to pursue ICD implant at this time.   -Decrease prednisone  to 15 mg daily -Increase methotrexate  to 20  mg once weekly -Continue folic acid  1 mg daily -Continue OTC vitamin D  and Calcium  -Continue pantoprazole  40 mg daily -Continue Bactrim  DS every MWF and Kcl to 40 meq in the AM and 20 meq in the PM.  -CMP and CBC in 1 month   2. CKD IIIB - Repeat BMP/BNP next visit   3. NSVT - Currently off amiodarone .    4. Hypertension  - hydralazine  10 mg TID - angioedema with ACEI; will hold off on addition of ARB - continue Coreg  3.125mg  BID   5. Nonobstructive CAD  - minimal CAD on LHC - continue atorvastatin     6. Hyperlipidemia  - LDL elevated at 125 - Lipitor  80mg  daily - repeat lipid panel in 3 months (3/25)   7. COPD  - stable on exam today - followed by pulmonology  - stable  Follow up: in 2 weeks with pharmacist  Please do not hesitate to reach out with questions or concerns,  Jaun Bash, PharmD, CPP, BCPS, [facility] Heart Failure Pharmacist  Phone - [phone] 08/20/2024 11:41 AM  "

## 2024-08-23 ENCOUNTER — Ambulatory Visit (HOSPITAL_COMMUNITY)
Admission: RE | Admit: 2024-08-23 | Discharge: 2024-08-23 | Disposition: A | Discharge status: Home / Self Care | Source: Ambulatory Visit | Attending: Internal Medicine | Admitting: Internal Medicine

## 2024-08-23 VITALS — BP 118/82 | HR 80 | Wt 210.0 lb

## 2024-08-23 DIAGNOSIS — I472 Ventricular tachycardia, unspecified: Secondary | ICD-10-CM | POA: Diagnosis not present

## 2024-08-23 DIAGNOSIS — G4733 Obstructive sleep apnea (adult) (pediatric): Secondary | ICD-10-CM | POA: Diagnosis not present

## 2024-08-23 DIAGNOSIS — N1832 Chronic kidney disease, stage 3b: Secondary | ICD-10-CM | POA: Diagnosis not present

## 2024-08-23 DIAGNOSIS — I130 Hypertensive heart and chronic kidney disease with heart failure and stage 1 through stage 4 chronic kidney disease, or unspecified chronic kidney disease: Secondary | ICD-10-CM | POA: Diagnosis not present

## 2024-08-23 DIAGNOSIS — J449 Chronic obstructive pulmonary disease, unspecified: Secondary | ICD-10-CM | POA: Insufficient documentation

## 2024-08-23 DIAGNOSIS — I5022 Chronic systolic (congestive) heart failure: Secondary | ICD-10-CM | POA: Insufficient documentation

## 2024-08-23 DIAGNOSIS — D8689 Sarcoidosis of other sites: Secondary | ICD-10-CM | POA: Insufficient documentation

## 2024-08-23 DIAGNOSIS — R7303 Prediabetes: Secondary | ICD-10-CM | POA: Diagnosis not present

## 2024-08-23 DIAGNOSIS — Z79899 Other long term (current) drug therapy: Secondary | ICD-10-CM | POA: Diagnosis not present

## 2024-08-23 DIAGNOSIS — E785 Hyperlipidemia, unspecified: Secondary | ICD-10-CM | POA: Insufficient documentation

## 2024-08-23 LAB — CBC WITH DIFFERENTIAL/PLATELET
Abs Immature Granulocytes: 0.14 K/uL — ABNORMAL HIGH (ref 0.00–0.07)
Basophils Absolute: 0 K/uL (ref 0.0–0.1)
Basophils Relative: 0 %
Eosinophils Absolute: 0 K/uL (ref 0.0–0.5)
Eosinophils Relative: 0 %
HCT: 48.1 % (ref 39.0–52.0)
Hemoglobin: 15.8 g/dL (ref 13.0–17.0)
Immature Granulocytes: 1 %
Lymphocytes Relative: 14 %
Lymphs Abs: 2.2 K/uL (ref 0.7–4.0)
MCH: 30.3 pg (ref 26.0–34.0)
MCHC: 32.8 g/dL (ref 30.0–36.0)
MCV: 92.3 fL (ref 80.0–100.0)
Monocytes Absolute: 1 K/uL (ref 0.1–1.0)
Monocytes Relative: 6 %
Neutro Abs: 12.1 K/uL — ABNORMAL HIGH (ref 1.7–7.7)
Neutrophils Relative %: 79 %
Platelets: 223 K/uL (ref 150–400)
RBC: 5.21 MIL/uL (ref 4.22–5.81)
RDW: 15.7 % — ABNORMAL HIGH (ref 11.5–15.5)
WBC: 15.5 K/uL — ABNORMAL HIGH (ref 4.0–10.5)
nRBC: 0 % (ref 0.0–0.2)

## 2024-08-23 LAB — HEMOGLOBIN A1C
Hgb A1c MFr Bld: 10.8 % — ABNORMAL HIGH (ref 4.8–5.6)
Mean Plasma Glucose: 263.26 mg/dL

## 2024-08-23 MED ORDER — TADALAFIL 5 MG PO TABS: 5.0000 mg | ORAL_TABLET | Freq: Every day | ORAL | Status: AC | PRN

## 2024-08-23 MED ORDER — PREDNISONE 10 MG PO TABS: 15.0000 mg | ORAL_TABLET | Freq: Every day | ORAL | 1 refills | Status: AC

## 2024-08-23 MED ORDER — METHOTREXATE SODIUM 2.5 MG PO TABS: ORAL_TABLET | ORAL | 1 refills | Status: AC

## 2024-08-23 MED ORDER — CARVEDILOL 3.125 MG PO TABS: 3.1250 mg | ORAL_TABLET | Freq: Two times a day (BID) | ORAL | 3 refills | Status: AC

## 2024-08-23 NOTE — Patient Instructions (Signed)
 It was a pleasure seeing you today!  MEDICATIONS: -Decrease prednisone  to 15 mg (1.5 tablets) once daily -Increase methotrexate  to 20 mg (8 tablets) once weekly -Start taking Pepcid (famotidine) 20 mg once to twice daily as needed for refux -Start taking Tums as needed for reflux. If you are taking 6 or more Tums per day, stop taking your calcium  supplement. -Call if you have questions about your medications.  LABS: -We will call you if your labs need attention.  NEXT APPOINTMENT: Return to clinic in 1 month to see the heart failure physician.  In general, to take care of your heart failure: -Limit your fluid intake to 2 Liters (half-gallon) per day.   -Limit your salt intake to ideally 2-3 grams (2000-3000 mg) per day. -Weigh yourself daily and record, and bring that weight diary to your next appointment.  (Weight gain of 2-3 pounds in 1 day typically means fluid weight.) -The medications for your heart are to help your heart and help you live longer.   -Please contact us  before stopping any of your heart medications.  Call the clinic at [phone] with questions or to reschedule future appointments.

## 2024-08-24 ENCOUNTER — Other Ambulatory Visit: Payer: Self-pay

## 2024-08-24 ENCOUNTER — Other Ambulatory Visit (HOSPITAL_COMMUNITY): Payer: Self-pay | Admitting: Pharmacist

## 2024-08-24 DIAGNOSIS — D8685 Sarcoid myocarditis: Secondary | ICD-10-CM

## 2024-08-24 MED ORDER — MOUNJARO 2.5 MG/0.5ML ~~LOC~~ SOAJ: 2.5000 mg | SUBCUTANEOUS | 1 refills | Status: DC

## 2024-08-24 NOTE — Progress Notes (Signed)
 Patient returned call and was informed of A1c and treatment plan with Mounjaro . He agreed to start therapy. He will repeat labs when he returns from traveling on Monday.

## 2024-08-24 NOTE — Progress Notes (Signed)
 A1c is up to 10.8. Symptoms as mentioned yesterday are almost certainly due to hyperglycemia. Attempted to call patient to discuss initiation of Mounjaro  and repeat labs with no response to either number on file. Voicemail left with instructions to call back.

## 2024-08-30 ENCOUNTER — Ambulatory Visit (HOSPITAL_COMMUNITY)
Admission: RE | Admit: 2024-08-30 | Discharge: 2024-08-30 | Disposition: A | Discharge status: Home / Self Care | Source: Ambulatory Visit | Attending: Cardiology | Admitting: Cardiology

## 2024-08-30 ENCOUNTER — Emergency Department (HOSPITAL_COMMUNITY)
Admission: EM | Admit: 2024-08-30 | Discharge: 2024-08-31 | Discharge status: Against Medical Advice | Attending: Emergency Medicine | Admitting: Emergency Medicine

## 2024-08-30 ENCOUNTER — Emergency Department (HOSPITAL_COMMUNITY)

## 2024-08-30 ENCOUNTER — Telehealth (HOSPITAL_COMMUNITY): Payer: Self-pay

## 2024-08-30 ENCOUNTER — Other Ambulatory Visit: Payer: Self-pay

## 2024-08-30 ENCOUNTER — Encounter (HOSPITAL_COMMUNITY): Payer: Self-pay

## 2024-08-30 DIAGNOSIS — Z5321 Procedure and treatment not carried out due to patient leaving prior to being seen by health care provider: Secondary | ICD-10-CM | POA: Diagnosis not present

## 2024-08-30 DIAGNOSIS — R0602 Shortness of breath: Secondary | ICD-10-CM | POA: Insufficient documentation

## 2024-08-30 DIAGNOSIS — R5383 Other fatigue: Secondary | ICD-10-CM | POA: Diagnosis not present

## 2024-08-30 DIAGNOSIS — I5022 Chronic systolic (congestive) heart failure: Secondary | ICD-10-CM | POA: Diagnosis present

## 2024-08-30 DIAGNOSIS — R112 Nausea with vomiting, unspecified: Secondary | ICD-10-CM | POA: Insufficient documentation

## 2024-08-30 DIAGNOSIS — R509 Fever, unspecified: Secondary | ICD-10-CM | POA: Diagnosis not present

## 2024-08-30 DIAGNOSIS — R197 Diarrhea, unspecified: Secondary | ICD-10-CM | POA: Diagnosis not present

## 2024-08-30 LAB — COMPREHENSIVE METABOLIC PANEL WITH GFR
ALT: 44 U/L (ref 0–44)
AST: 29 U/L (ref 15–41)
Albumin: 4.1 g/dL (ref 3.5–5.0)
Alkaline Phosphatase: 111 U/L (ref 38–126)
Anion gap: 14 (ref 5–15)
BUN: 37 mg/dL — ABNORMAL HIGH (ref 8–23)
CO2: 26 mmol/L (ref 22–32)
Calcium: 10.9 mg/dL — ABNORMAL HIGH (ref 8.9–10.3)
Chloride: 95 mmol/L — ABNORMAL LOW (ref 98–111)
Creatinine, Ser: 2.47 mg/dL — ABNORMAL HIGH (ref 0.61–1.24)
GFR, Estimated: 28 mL/min — ABNORMAL LOW
Glucose, Bld: 596 mg/dL (ref 70–99)
Potassium: 4.5 mmol/L (ref 3.5–5.1)
Sodium: 135 mmol/L (ref 135–145)
Total Bilirubin: 0.8 mg/dL (ref 0.0–1.2)
Total Protein: 7.1 g/dL (ref 6.5–8.1)

## 2024-08-30 LAB — CBC WITH DIFFERENTIAL/PLATELET
Abs Immature Granulocytes: 0.12 K/uL — ABNORMAL HIGH (ref 0.00–0.07)
Basophils Absolute: 0 K/uL (ref 0.0–0.1)
Basophils Relative: 0 %
Eosinophils Absolute: 0 K/uL (ref 0.0–0.5)
Eosinophils Relative: 0 %
HCT: 47.2 % (ref 39.0–52.0)
Hemoglobin: 16 g/dL (ref 13.0–17.0)
Immature Granulocytes: 1 %
Lymphocytes Relative: 17 %
Lymphs Abs: 2.3 K/uL (ref 0.7–4.0)
MCH: 31.2 pg (ref 26.0–34.0)
MCHC: 33.9 g/dL (ref 30.0–36.0)
MCV: 92 fL (ref 80.0–100.0)
Monocytes Absolute: 0.8 K/uL (ref 0.1–1.0)
Monocytes Relative: 6 %
Neutro Abs: 10.1 K/uL — ABNORMAL HIGH (ref 1.7–7.7)
Neutrophils Relative %: 76 %
Platelets: 190 K/uL (ref 150–400)
RBC: 5.13 MIL/uL (ref 4.22–5.81)
RDW: 16 % — ABNORMAL HIGH (ref 11.5–15.5)
WBC: 13.4 K/uL — ABNORMAL HIGH (ref 4.0–10.5)
nRBC: 0 % (ref 0.0–0.2)

## 2024-08-30 LAB — TROPONIN T, HIGH SENSITIVITY: Troponin T High Sensitivity: 43 ng/L — ABNORMAL HIGH (ref 0–19)

## 2024-08-30 LAB — PRO BRAIN NATRIURETIC PEPTIDE: Pro Brain Natriuretic Peptide: 500 pg/mL — ABNORMAL HIGH

## 2024-08-30 NOTE — Telephone Encounter (Signed)
 Patient came to clinic for labs, CMA asked for  nurse to come evaluate due to patient not feeling well.   Went and spoke with patient, he states that he's been sick for a week, states that he's had issues keeping food, liquids, and medications down. Reports he had a fever last night. Patient states that he feels like he is drowning in fluid, not able to lie down, extreme fatigue, and states he overall does not feel well. States that he was able to take some medications today with some protein shake. Patients abdomen visibly distended , patient very short of breath. Patient has hx of CHF/cardiac sarcoid. Advised patient he should be evaluated in the ER. Patient agreed, wheeled patient to the ER and checked in. Patients wife with him, she went to move the car while patient gets checked in.

## 2024-08-30 NOTE — ED Triage Notes (Addendum)
 Patient brought to ED from heart failure clinic. He has been having shob, nausea and vomiting for 7 days. He has not been able to keep any food or liquids down.   He states he feels likes he's drowning and had a fever yesterday. Has chest pressure on the left side.  He has CHF.

## 2024-08-30 NOTE — ED Provider Triage Note (Signed)
 Emergency Medicine Provider Triage Evaluation Note  JAICE DIGIOIA , a 68 y.o. male  was evaluated in triage.  Pt complains of sob. For the past week pt has had increased fatigue, sob, n/v/d, fever, and not feeling well.  Felt like he's drowning.  Went to heart failure clinic today and was sent here  Review of Systems  Positive: As above Negative: As above  Physical Exam  BP (!) 120/90 (BP Location: Left Arm)   Pulse 84   Temp 97.8 F (36.6 C) (Oral)   Resp 15   SpO2 98%  Gen:   Awake, no distress   Resp:  Normal effort  MSK:   Moves extremities without difficulty  Other:    Medical Decision Making  Medically screening exam initiated at 4:20 PM.  Appropriate orders placed.  RAFFAELE DERISE was informed that the remainder of the evaluation will be completed by another provider, this initial triage assessment does not replace that evaluation, and the importance of remaining in the ED until their evaluation is complete.     Nivia Colon, PA-C 08/30/24 1623

## 2024-08-30 NOTE — ED Notes (Signed)
 Elevated glucose result reported to Dr. Dean (EDP).

## 2024-08-31 NOTE — ED Notes (Signed)
 Pt sated that he was leaving.

## 2024-09-08 ENCOUNTER — Encounter (HOSPITAL_COMMUNITY): Payer: Self-pay

## 2024-09-13 ENCOUNTER — Telehealth: Payer: Self-pay | Admitting: Pharmacist

## 2024-09-13 NOTE — Telephone Encounter (Signed)
 Called patient with no response. Patient may need to be seen by Dr. Mclean sooner. LVM with instructions to call back.

## 2024-09-14 ENCOUNTER — Telehealth (HOSPITAL_COMMUNITY): Payer: Self-pay | Admitting: Emergency Medicine

## 2024-09-14 NOTE — Telephone Encounter (Signed)
Attempted to call patient regarding upcoming cardiac PET appointment. Left message on voicemail with name and callback number Aidan Moten RN Navigator Cardiac Imaging [facility] [phone] Office [phone] Cell  

## 2024-09-14 NOTE — Telephone Encounter (Signed)
 Called patient again with no response. LVM with instructions to call back.

## 2024-09-15 ENCOUNTER — Telehealth (HOSPITAL_COMMUNITY): Payer: Self-pay | Admitting: Emergency Medicine

## 2024-09-15 NOTE — Telephone Encounter (Signed)
Attempted to call patient regarding upcoming cardiac PET appointment. Left message on voicemail with name and callback number Aidan Moten RN Navigator Cardiac Imaging [facility] [phone] Office [phone] Cell  

## 2024-09-16 ENCOUNTER — Encounter (HOSPITAL_COMMUNITY)
Admission: RE | Admit: 2024-09-16 | Discharge: 2024-09-16 | Disposition: A | Discharge status: Home / Self Care | Source: Ambulatory Visit | Attending: Cardiology | Admitting: Cardiology

## 2024-09-16 ENCOUNTER — Encounter (HOSPITAL_COMMUNITY)

## 2024-09-16 DIAGNOSIS — D8685 Sarcoid myocarditis: Secondary | ICD-10-CM | POA: Insufficient documentation

## 2024-09-21 ENCOUNTER — Telehealth (HOSPITAL_COMMUNITY): Payer: Self-pay | Admitting: *Deleted

## 2024-09-21 ENCOUNTER — Telehealth (HOSPITAL_COMMUNITY): Payer: Self-pay | Admitting: Pharmacist

## 2024-09-21 MED ORDER — MOUNJARO 2.5 MG/0.5ML ~~LOC~~ SOAJ: 2.5000 mg | SUBCUTANEOUS | 1 refills | Status: DC

## 2024-09-21 NOTE — Telephone Encounter (Signed)
 Patient's wife returning call about upcoming cardiac imaging study; pt's wife verbalizes understanding of appt date/time, parking situation and where to check in, pre-test NPO status; name and call back number provided for further questions should they arise  Chantal Requena RN Navigator Cardiac Imaging [facility] [phone] office [phone] cell  Reviewed diet prep and patient's wife verbalized understanding.

## 2024-09-21 NOTE — Telephone Encounter (Signed)
 Attempted to call patient regarding upcoming cardiac PET appointment. Left message on voicemail with name and callback number  Larey Brick RN Navigator Cardiac Imaging [facility] [phone] Office [phone] Cell

## 2024-09-21 NOTE — Telephone Encounter (Signed)
 Patient's wife called because she has not received Mounjaro  thus far. CBGs have been high and patient did not pick up insulin  either. Re-sent prescription and [facility] needed by [facility] staff. Patient has PCP visit tomorrow to re-assess diabetes management.

## 2024-09-23 ENCOUNTER — Encounter (HOSPITAL_COMMUNITY)

## 2024-09-23 ENCOUNTER — Encounter (HOSPITAL_COMMUNITY): Admission: RE | Admit: 2024-09-23 | Source: Ambulatory Visit

## 2024-09-23 ENCOUNTER — Encounter (HOSPITAL_COMMUNITY): Payer: Self-pay

## 2024-09-24 ENCOUNTER — Telehealth: Payer: Self-pay | Admitting: Pharmacist

## 2024-09-24 ENCOUNTER — Telehealth: Payer: Self-pay | Admitting: Pharmacy Technician

## 2024-09-24 MED ORDER — SEMAGLUTIDE(0.25 OR 0.5MG/DOS) 2 MG/3ML ~~LOC~~ SOPN: PEN_INJECTOR | SUBCUTANEOUS | 2 refills | Status: AC

## 2024-09-24 NOTE — Telephone Encounter (Signed)
 Received fax from [place] stating Mounjaro  is non-formualry. PA faxed to the [facility] for Ozempic 0.25 mg subcutaneously weekly along with a prescription.

## 2024-09-24 NOTE — Telephone Encounter (Signed)
 Thank you very much, PA returned and new prescription sent.

## 2024-09-24 NOTE — Telephone Encounter (Signed)
 Hi, we received a message from the [place] that is below and scanned in media

## 2024-09-28 ENCOUNTER — Telehealth (HOSPITAL_COMMUNITY): Payer: Self-pay | Admitting: Emergency Medicine

## 2024-09-28 NOTE — Telephone Encounter (Signed)
Attempted to call patient regarding upcoming cardiac PET appointment. Left message on voicemail with name and callback number Aidan Moten RN Navigator Cardiac Imaging [facility] [phone] Office [phone] Cell  

## 2024-09-30 ENCOUNTER — Encounter (HOSPITAL_COMMUNITY)
Admission: RE | Admit: 2024-09-30 | Discharge: 2024-09-30 | Disposition: A | Discharge status: Home / Self Care | Source: Ambulatory Visit | Attending: Cardiology | Admitting: Cardiology

## 2024-09-30 DIAGNOSIS — D8685 Sarcoid myocarditis: Secondary | ICD-10-CM | POA: Insufficient documentation

## 2024-09-30 LAB — NM PET CT MYOCARDIAL SARCOIDOSIS
Nuc Stress EF: 20 %
Rest Nuclear Isotope Dose: 24.4 mCi

## 2024-09-30 MED ORDER — RUBIDIUM RB82 GENERATOR (RUBYFILL)
24.3900 | PACK | Freq: Once | INTRAVENOUS | Status: AC
  Administered 2024-09-30: 24.39 via INTRAVENOUS

## 2024-09-30 MED ORDER — FLUDEOXYGLUCOSE F - 18 (FDG) INJECTION
8.9100 | Freq: Once | INTRAVENOUS | Status: AC
  Administered 2024-09-30: 8.91 via INTRAVENOUS

## 2024-10-02 ENCOUNTER — Ambulatory Visit (HOSPITAL_COMMUNITY): Payer: Self-pay | Admitting: Cardiology

## 2024-10-04 NOTE — Telephone Encounter (Addendum)
 Pt aware, agreeable, and verbalized understanding   ----- Message from Ezra Shuck, MD sent at 10/02/2024 12:05 PM EST ----- This study is not suggestive of active cardiac sarcoidosis.

## 2024-10-12 ENCOUNTER — Ambulatory Visit (HOSPITAL_COMMUNITY): Admitting: Cardiology
